# Patient Record
Sex: Female | Born: 1965 | Hispanic: Yes | Marital: Married | Smoking: Never smoker
Health system: Southern US, Community
[De-identification: ages and names within clinical notes are randomized; demographics above are authoritative.]

## PROBLEM LIST (undated history)

## (undated) DIAGNOSIS — K580 Irritable bowel syndrome with diarrhea: Secondary | ICD-10-CM

## (undated) DIAGNOSIS — M542 Cervicalgia: Secondary | ICD-10-CM

## (undated) DIAGNOSIS — G4700 Insomnia, unspecified: Secondary | ICD-10-CM

## (undated) DIAGNOSIS — F5104 Psychophysiologic insomnia: Secondary | ICD-10-CM

## (undated) DIAGNOSIS — K219 Gastro-esophageal reflux disease without esophagitis: Secondary | ICD-10-CM

## (undated) DIAGNOSIS — J0100 Acute maxillary sinusitis, unspecified: Secondary | ICD-10-CM

## (undated) DIAGNOSIS — G8929 Other chronic pain: Secondary | ICD-10-CM

## (undated) DIAGNOSIS — R29898 Other symptoms and signs involving the musculoskeletal system: Secondary | ICD-10-CM

## (undated) DIAGNOSIS — Z91018 Allergy to other foods: Secondary | ICD-10-CM

## (undated) DIAGNOSIS — C801 Malignant (primary) neoplasm, unspecified: Secondary | ICD-10-CM

## (undated) DIAGNOSIS — T7840XA Allergy, unspecified, initial encounter: Secondary | ICD-10-CM

## (undated) DIAGNOSIS — R109 Unspecified abdominal pain: Secondary | ICD-10-CM

## (undated) DIAGNOSIS — E509 Vitamin A deficiency, unspecified: Secondary | ICD-10-CM

## (undated) DIAGNOSIS — K589 Irritable bowel syndrome without diarrhea: Secondary | ICD-10-CM

## (undated) DIAGNOSIS — K30 Functional dyspepsia: Secondary | ICD-10-CM

## (undated) HISTORY — DX: Functional dyspepsia: K30

## (undated) HISTORY — DX: Acute maxillary sinusitis, unspecified: J01.00

## (undated) HISTORY — DX: Cervicalgia: M54.2

## (undated) HISTORY — DX: Allergy, unspecified, initial encounter: T78.40XA

## (undated) HISTORY — DX: Vitamin a deficiency, unspecified: E50.9

## (undated) HISTORY — DX: Insomnia, unspecified: G47.00

## (undated) HISTORY — DX: Other symptoms and signs involving the musculoskeletal system: R29.898

## (undated) HISTORY — DX: Irritable bowel syndrome, unspecified: K58.9

## (undated) HISTORY — DX: Other chronic pain: G89.29

## (undated) HISTORY — DX: Psychophysiologic insomnia: F51.04

## (undated) HISTORY — PX: TUBAL LIGATION: SHX77

## (undated) HISTORY — DX: Gastro-esophageal reflux disease without esophagitis: K21.9

## (undated) HISTORY — DX: Allergy to other foods: Z91.018

## (undated) HISTORY — PX: ABDOMINAL HYSTERECTOMY: SHX81

## (undated) HISTORY — DX: Irritable bowel syndrome with diarrhea: K58.0

## (undated) HISTORY — DX: Unspecified abdominal pain: R10.9

---

## 2004-08-14 ENCOUNTER — Ambulatory Visit: Payer: Self-pay | Admitting: Internal Medicine

## 2004-08-22 ENCOUNTER — Ambulatory Visit: Payer: Self-pay | Admitting: Internal Medicine

## 2005-03-13 ENCOUNTER — Ambulatory Visit: Payer: Self-pay

## 2005-07-20 ENCOUNTER — Ambulatory Visit: Payer: Self-pay

## 2006-01-16 ENCOUNTER — Ambulatory Visit: Payer: Self-pay | Admitting: Gastroenterology

## 2008-02-20 ENCOUNTER — Ambulatory Visit: Payer: Self-pay | Admitting: Unknown Physician Specialty

## 2008-12-17 ENCOUNTER — Ambulatory Visit: Payer: Self-pay | Admitting: Family Medicine

## 2009-01-05 ENCOUNTER — Ambulatory Visit: Payer: Self-pay | Admitting: Unknown Physician Specialty

## 2009-01-11 ENCOUNTER — Ambulatory Visit: Payer: Self-pay | Admitting: Unknown Physician Specialty

## 2010-06-27 ENCOUNTER — Emergency Department: Payer: Self-pay | Admitting: Emergency Medicine

## 2010-10-13 ENCOUNTER — Ambulatory Visit: Payer: Self-pay | Admitting: Unknown Physician Specialty

## 2011-06-01 ENCOUNTER — Ambulatory Visit: Payer: Self-pay | Admitting: Family Medicine

## 2012-09-26 LAB — LIPID PANEL
CHOLESTEROL: 193 mg/dL (ref 0–200)
HDL: 81 mg/dL — AB (ref 35–70)
LDL Cholesterol: 96 mg/dL
Triglycerides: 79 mg/dL (ref 40–160)

## 2014-01-22 ENCOUNTER — Ambulatory Visit: Payer: Self-pay | Admitting: Family Medicine

## 2014-01-22 LAB — HM MAMMOGRAPHY: HM Mammogram: NORMAL

## 2014-01-25 ENCOUNTER — Ambulatory Visit: Payer: Self-pay | Admitting: Family Medicine

## 2014-12-17 ENCOUNTER — Emergency Department: Payer: Self-pay | Admitting: Student

## 2014-12-24 ENCOUNTER — Ambulatory Visit: Payer: Self-pay | Admitting: Family Medicine

## 2015-05-03 ENCOUNTER — Ambulatory Visit (INDEPENDENT_AMBULATORY_CARE_PROVIDER_SITE_OTHER): Payer: 59 | Admitting: Family Medicine

## 2015-05-03 ENCOUNTER — Encounter: Payer: Self-pay | Admitting: Family Medicine

## 2015-05-03 VITALS — BP 116/70 | HR 83 | Temp 98.2°F | Resp 16 | Ht 63.0 in | Wt 123.7 lb

## 2015-05-03 DIAGNOSIS — K219 Gastro-esophageal reflux disease without esophagitis: Secondary | ICD-10-CM | POA: Diagnosis not present

## 2015-05-03 DIAGNOSIS — G8929 Other chronic pain: Secondary | ICD-10-CM | POA: Diagnosis not present

## 2015-05-03 DIAGNOSIS — G4700 Insomnia, unspecified: Secondary | ICD-10-CM | POA: Insufficient documentation

## 2015-05-03 DIAGNOSIS — M2669 Other specified disorders of temporomandibular joint: Secondary | ICD-10-CM

## 2015-05-03 DIAGNOSIS — K580 Irritable bowel syndrome with diarrhea: Secondary | ICD-10-CM

## 2015-05-03 DIAGNOSIS — K589 Irritable bowel syndrome without diarrhea: Secondary | ICD-10-CM | POA: Insufficient documentation

## 2015-05-03 DIAGNOSIS — R6884 Jaw pain: Secondary | ICD-10-CM

## 2015-05-03 DIAGNOSIS — M26649 Arthritis of unspecified temporomandibular joint: Secondary | ICD-10-CM | POA: Insufficient documentation

## 2015-05-03 DIAGNOSIS — K30 Functional dyspepsia: Secondary | ICD-10-CM | POA: Insufficient documentation

## 2015-05-03 DIAGNOSIS — E559 Vitamin D deficiency, unspecified: Secondary | ICD-10-CM | POA: Insufficient documentation

## 2015-05-03 DIAGNOSIS — Z862 Personal history of diseases of the blood and blood-forming organs and certain disorders involving the immune mechanism: Secondary | ICD-10-CM | POA: Insufficient documentation

## 2015-05-03 DIAGNOSIS — Z5189 Encounter for other specified aftercare: Secondary | ICD-10-CM | POA: Insufficient documentation

## 2015-05-03 DIAGNOSIS — J3089 Other allergic rhinitis: Secondary | ICD-10-CM

## 2015-05-03 DIAGNOSIS — J302 Other seasonal allergic rhinitis: Secondary | ICD-10-CM | POA: Insufficient documentation

## 2015-05-03 DIAGNOSIS — K582 Mixed irritable bowel syndrome: Secondary | ICD-10-CM | POA: Insufficient documentation

## 2015-05-03 MED ORDER — OMEPRAZOLE 20 MG PO CPDR: 20.0000 mg | DELAYED_RELEASE_CAPSULE | Freq: Every day | ORAL | Status: DC

## 2015-05-03 NOTE — Addendum Note (Signed)
Addended by: Lolita Rieger D on: 05/03/2015 04:21 PM   Modules accepted: Orders

## 2015-05-03 NOTE — Progress Notes (Signed)
Name: Helen Green   MRN: [id number]    DOB: 1966/07/08   Date:05/03/2015       Progress Note  Subjective  Chief Complaint  Chief Complaint  Patient presents with  . Referral    Patient needs X-Rays for TMJ-grinding, pain on left side and jaw.  . Temporomandibular Joint Pain    Onset 2 months, Worsen on left side-started and can not open mouth wide and having pain, griding sensation    HPI  Jaw Pain: she has a history of TMJ but states symptoms are severe now and she will have oral surgery and would like to make sure everything is okay before she goes for wisdom tooth extraction  GERD: she is currently Ranitidine, but is not working for her any longer, she still has heartburn, she used to take Omeprazole but has been out and would like a refill.   IBS: she continues to have a lot of pain intermittently, she states last episode was a couple of weeks ago , she went to [facility] but no labs or studies were done, she was concerned about appendicitis but she did not have a fever or nausea/vomitng . Pain resolved within a couple of days.   Patient Active Problem List   Diagnosis Date Noted  . Insomnia, persistent 05/03/2015  . Encounter for other specified aftercare 05/03/2015  . Functional dyspepsia 05/03/2015  . Gastro-esophageal reflux disease without esophagitis 05/03/2015  . History of anemia 05/03/2015  . Perennial allergic rhinitis with seasonal variation 05/03/2015  . 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 Vitamin D deficiency 05/03/2015  . Irritable bowel syndrome with diarrhea 05/03/2015    History  Substance Use Topics  . Smoking status: Never Smoker   . Smokeless tobacco: Not on file  . Alcohol Use: No     Current outpatient prescriptions:  .  ALPRAZolam (XANAX) 0.5 MG tablet, Take 1 tablet by mouth daily., Disp: , Rfl:  .  cyclobenzaprine (FLEXERIL) 5 MG tablet, Take 1 tablet by mouth at bedtime., Disp: , Rfl:  .  fexofenadine (ALLEGRA ALLERGY) 180 MG  tablet, Take 1 tablet by mouth as needed., Disp: , Rfl:  .  hyoscyamine (LEVBID) 0.375 MG 12 hr tablet, Take 1 tablet by mouth 2 (two) times daily as needed., Disp: , Rfl:  .  montelukast (SINGULAIR) 10 MG tablet, Take 1 tablet by mouth daily., Disp: , Rfl:  .  omeprazole (PRILOSEC) 20 MG capsule, Take 20 mg by mouth., Disp: , Rfl:  .  ranitidine (ZANTAC) 150 MG tablet, Take 1 tablet by mouth daily., Disp: , Rfl:   Allergies  Allergen Reactions  . Apple Hives, Shortness Of Breath and Swelling  . Daucus Carota Hives, Shortness Of Breath and Swelling  . Other Hives, Shortness Of Breath and Swelling    Pears, avacados, nuts  . Avocado     and raw fruit and vegetables    ROS  Constitutional: Negative for fever or weight change.  Respiratory: Negative for cough and shortness of breath.   Cardiovascular: Negative for chest pain or palpitations.  Gastrointestinal: Positive  for abdominal pain but  no bowel changes.  Musculoskeletal: Negative for gait problem or joint swelling.  Skin: Negative for rash.  Neurological: Negative for dizziness or headache.  No other specific complaints in a complete review of systems (except as listed in HPI above).  Objective  Filed Vitals:   05/03/15 1431  BP: 116/70  Pulse: 83  Temp: 98.2 F (36.8 C)  TempSrc: Oral  Resp: 16  Height: 5\' 3"  (1.6 m)  Weight: 123 lb 11.2 oz (56.11 kg)  SpO2: 97%    Body mass index is 21.92 kg/(m^2).    Physical Exam  Constitutional: Patient appears well-developed and well-nourished. No distress.  Eyes:  No scleral icterus.  Tender during palpation of left TMJ, also pain with abduction of jaw, no swelling or redness Neck: Normal range of motion. Neck supple. Cardiovascular: Normal rate, regular rhythm and normal heart sounds.  No murmur heard. No BLE edema. Pulmonary/Chest: Effort normal and breath sounds normal. No respiratory distress. Abdominal: Soft.  There is tenderness diffusely  Psychiatric: Patient  has a normal mood and affect. behavior is normal. Judgment and thought content normal.   Assessment & Plan  1. Chronic jaw pain Take Tylenol prn , check x-rays - PR DENTAL OTHER TMJ FILMS  2. Gastro-esophageal reflux disease without esophagitis Stop Ranitidine, resume Omeprazole - omeprazole (PRILOSEC) 20 MG capsule; Take 1 capsule (20 mg total) by mouth daily.  Dispense: 30 capsule; Refill: 5  3. Irritable bowel syndrome with diarrhea Continue hyoscyamine since it seems to help her

## 2015-05-04 ENCOUNTER — Telehealth: Payer: Self-pay | Admitting: Family Medicine

## 2015-05-04 DIAGNOSIS — R6884 Jaw pain: Secondary | ICD-10-CM

## 2015-05-04 NOTE — Telephone Encounter (Signed)
Anderson Malta from [facility] [place] is requesting a corrected order for xray TMJ for chronic jaw pain. It should not be MRI. Not sure what side should be x-rayed so please verify that as well. If it is easier you can write it out and fax to [phone]

## 2015-05-04 NOTE — Telephone Encounter (Signed)
resent order per request

## 2015-05-06 ENCOUNTER — Ambulatory Visit: Payer: Self-pay | Admitting: Family Medicine

## 2015-05-12 ENCOUNTER — Ambulatory Visit: Admission: RE | Admit: 2015-05-12 | Payer: Self-pay | Source: Ambulatory Visit

## 2015-05-16 ENCOUNTER — Other Ambulatory Visit: Payer: Self-pay | Admitting: Family Medicine

## 2015-05-16 ENCOUNTER — Ambulatory Visit
Admission: RE | Admit: 2015-05-16 | Discharge: 2015-05-16 | Disposition: A | Discharge status: Home / Self Care | Payer: 59 | Source: Ambulatory Visit | Attending: Family Medicine | Admitting: Family Medicine

## 2015-05-16 DIAGNOSIS — R6884 Jaw pain: Secondary | ICD-10-CM | POA: Insufficient documentation

## 2015-06-30 ENCOUNTER — Encounter: Payer: 59 | Admitting: Family Medicine

## 2015-07-12 ENCOUNTER — Ambulatory Visit (INDEPENDENT_AMBULATORY_CARE_PROVIDER_SITE_OTHER): Payer: 59 | Admitting: Family Medicine

## 2015-07-12 ENCOUNTER — Encounter: Payer: Self-pay | Admitting: Family Medicine

## 2015-07-12 VITALS — BP 118/68 | HR 81 | Temp 98.6°F | Resp 18 | Ht 63.0 in | Wt 122.9 lb

## 2015-07-12 DIAGNOSIS — E559 Vitamin D deficiency, unspecified: Secondary | ICD-10-CM

## 2015-07-12 DIAGNOSIS — Z79899 Other long term (current) drug therapy: Secondary | ICD-10-CM

## 2015-07-12 DIAGNOSIS — N951 Menopausal and female climacteric states: Secondary | ICD-10-CM | POA: Diagnosis not present

## 2015-07-12 DIAGNOSIS — Z862 Personal history of diseases of the blood and blood-forming organs and certain disorders involving the immune mechanism: Secondary | ICD-10-CM

## 2015-07-12 DIAGNOSIS — J3089 Other allergic rhinitis: Secondary | ICD-10-CM

## 2015-07-12 DIAGNOSIS — K580 Irritable bowel syndrome with diarrhea: Secondary | ICD-10-CM | POA: Diagnosis not present

## 2015-07-12 DIAGNOSIS — Z23 Encounter for immunization: Secondary | ICD-10-CM

## 2015-07-12 DIAGNOSIS — R109 Unspecified abdominal pain: Secondary | ICD-10-CM | POA: Diagnosis not present

## 2015-07-12 DIAGNOSIS — J302 Other seasonal allergic rhinitis: Secondary | ICD-10-CM

## 2015-07-12 DIAGNOSIS — J309 Allergic rhinitis, unspecified: Secondary | ICD-10-CM | POA: Diagnosis not present

## 2015-07-12 DIAGNOSIS — K219 Gastro-esophageal reflux disease without esophagitis: Secondary | ICD-10-CM

## 2015-07-12 MED ORDER — OMEPRAZOLE 40 MG PO CPDR: 40.0000 mg | DELAYED_RELEASE_CAPSULE | Freq: Every day | ORAL | Status: DC

## 2015-07-12 MED ORDER — HYOSCYAMINE SULFATE ER 0.375 MG PO TB12: 0.3750 mg | ORAL_TABLET | Freq: Two times a day (BID) | ORAL | Status: DC | PRN

## 2015-07-12 MED ORDER — FEXOFENADINE HCL 180 MG PO TABS: 180.0000 mg | ORAL_TABLET | ORAL | Status: DC | PRN

## 2015-07-12 MED ORDER — FEXOFENADINE HCL 180 MG PO TABS: 180.0000 mg | ORAL_TABLET | ORAL | Status: AC | PRN

## 2015-07-12 MED ORDER — DULOXETINE HCL 60 MG PO CPEP: 60.0000 mg | ORAL_CAPSULE | Freq: Every day | ORAL | Status: DC

## 2015-07-12 MED ORDER — DULOXETINE HCL 60 MG PO CPEP
60.0000 mg | ORAL_CAPSULE | Freq: Every day | ORAL | Status: DC
Start: 2015-07-12 — End: 2015-07-12

## 2015-07-12 NOTE — Progress Notes (Signed)
Name: Helen Green   MRN: [id number]    DOB: 1966/09/05   Date:07/12/2015       Progress Note  Subjective  Chief Complaint  Chief Complaint  Patient presents with  . Abdominal Pain    LUQ worsening onset 2 months  . Hot Flashes    onset 1 month  . Gastrophageal Reflux    needs refill  . Allergic Rhinitis     needs refill    HPI  IBS: seen by GI multiple times, previous history of h. Pylori seen by GI about 8 months ago and had negative urea breath test. She continues to have abdominal pain, cramping, bloating, taking Levsin with some improvement, but still has to stay in the recliner at night to feel comfortable.   Perimenopause: she has noticed increase in hot flashes, status post hysterectomy, she would like to try a medication for it.   GERD: rx of Omeprazole was not at the pharmacy, she has burning sensation in her epigastric area that radiates to esophagus. Worse at night.  Pain is usually 2 hours after meals.   AR: she states her symptoms are under control, needs refills because Fall is about to start .   Left Flank Pain: she states it has been present for years, intermittent, but worse over the past 2 months. Pain is described as sharp and radiates to her left hip and also up to her upper back. No rashes  Patient Active Problem List   Diagnosis Date Noted  . Insomnia, persistent 05/03/2015  . Encounter for other specified aftercare 05/03/2015  . Functional dyspepsia 05/03/2015  . Gastro-esophageal reflux disease without esophagitis 05/03/2015  . History of anemia 05/03/2015  . Perennial allergic rhinitis with seasonal variation 05/03/2015  . 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 Vitamin D deficiency 05/03/2015  . Irritable bowel syndrome with diarrhea 05/03/2015    Past Surgical History  Procedure Laterality Date  . Tubal ligation    . Abdominal hysterectomy      Family History  Problem Relation Age of Onset  . Diabetes Mother   .  Hyperlipidemia Mother   . Hypertension Mother   . Liver disease Father     Social History   Social History  . Marital Status: Divorced    Spouse Name: N/A  . Number of Children: N/A  . Years of Education: N/A   Occupational History  . Not on file.   Social History Main Topics  . Smoking status: Never Smoker   . Smokeless tobacco: Never Used  . Alcohol Use: No  . Drug Use: No  . Sexual Activity: Not Currently   Other Topics Concern  . Not on file   Social History Narrative     Current outpatient prescriptions:  .  ALPRAZolam (XANAX) 0.5 MG tablet, Take 1 tablet by mouth daily., Disp: , Rfl:  .  cyclobenzaprine (FLEXERIL) 5 MG tablet, Take 1 tablet by mouth at bedtime., Disp: , Rfl:  .  fexofenadine (ALLEGRA ALLERGY) 180 MG tablet, Take 1 tablet by mouth as needed., Disp: , Rfl:  .  hyoscyamine (LEVBID) 0.375 MG 12 hr tablet, Take 1 tablet by mouth 2 (two) times daily as needed., Disp: , Rfl:  .  montelukast (SINGULAIR) 10 MG tablet, Take 1 tablet by mouth daily., Disp: , Rfl:  .  omeprazole (PRILOSEC) 20 MG capsule, Take 1 capsule (20 mg total) by mouth daily., Disp: 30 capsule, Rfl: 5  Allergies  Allergen Reactions  . Apple Hives, Shortness Of  Breath and Swelling  . Daucus Carota Hives, Shortness Of Breath and Swelling  . Other Hives, Shortness Of Breath and Swelling    Pears, avacados, nuts  . Avocado     and raw fruit and vegetables     ROS  Constitutional: Negative for fever or weight change.  Respiratory: Negative for cough and shortness of breath.   Cardiovascular: Negative for chest pain or palpitations.  Gastrointestinal: Positive  for abdominal pain, no bowel changes.  Musculoskeletal: Negative for gait problem or joint swelling.  Skin: Negative for rash.  Neurological: Negative for dizziness or headache.  No other specific complaints in a complete review of systems (except as listed in HPI above).  Objective  Filed Vitals:   07/12/15 1503  BP:  118/68  Pulse: 81  Temp: 98.6 F (37 C)  TempSrc: Oral  Resp: 18  Height: 5\' 3"  (1.6 m)  Weight: 122 lb 14.4 oz (55.747 kg)  SpO2: 96%    Body mass index is 21.78 kg/(m^2).  Physical Exam  Constitutional: Patient appears well-developed and well-nourished.No distress.  HEENT: head atraumatic, normocephalic, pupils equal and reactive to light,  neck supple, throat within normal limits Cardiovascular: Normal rate, regular rhythm and normal heart sounds.  No murmur heard. No BLE edema. Pulmonary/Chest: Effort normal and breath sounds normal. No respiratory distress. Abdominal: Soft.  There is epigastric pain, also left flank pain, positive CVA tenderness.  Psychiatric: Patient has a normal mood and affect. behavior is normal. Judgment and thought content normal.  PHQ2/9: Depression screen [facility] 2/9 07/12/2015 05/03/2015  Decreased Interest - 0  Down, Depressed, Hopeless 0 0  PHQ - 2 Score 0 0    Fall Risk: Fall Risk  07/12/2015 05/03/2015  Falls in the past year? No No    Functional Status Survey: Is the patient deaf or have difficulty hearing?: No Does the patient have difficulty seeing, even when wearing glasses/contacts?: Yes (glasses) Does the patient have difficulty concentrating, remembering, or making decisions?: No Does the patient have difficulty walking or climbing stairs?: No Does the patient have difficulty dressing or bathing?: No Does the patient have difficulty doing errands alone such as visiting a doctor's office or shopping?: No   Assessment & Plan  1. Gastro-esophageal reflux disease without esophagitis  We will send prescription again  - omeprazole (PRILOSEC) 40 MG capsule; Take 1 capsule (40 mg total) by mouth daily.  Dispense: 30 capsule; Refill: 5  2. Needs flu shot  - Flu Vaccine QUAD 36+ mos PF IM (Fluarix & Fluzone Quad PF)  3. Perennial allergic rhinitis with seasonal variation  - fexofenadine (ALLEGRA ALLERGY) 180 MG tablet; Take 1 tablet (180  mg total) by mouth as needed.  Dispense: 30 tablet; Refill: 5  4. Irritable bowel syndrome with diarrhea  - hyoscyamine (LEVBID) 0.375 MG 12 hr tablet; Take 1 tablet (0.375 mg total) by mouth 2 (two) times daily as needed.  Dispense: 60 tablet; Refill: 5 - DULoxetine (CYMBALTA) 60 MG capsule; Take 1 capsule (60 mg total) by mouth daily.  Dispense: 30 capsule; Refill: 2  5. History of anemia  - CBC with Differential/Platelet  6. Vitamin D deficiency  - Vit D  25 hydroxy (rtn osteoporosis monitoring)  7. Perimenopause  She will start with half the beads for one week, if not covered by insurance we will try Effexor, discussed possible side effects of medications - DULoxetine (CYMBALTA) 60 MG capsule; Take 1 capsule (60 mg total) by mouth daily.  Dispense: 30 capsule; Refill:  2 LTA) 60 MG capsule; Take 1 capsule (60 mg total) by mouth daily.  Dispense: 30 capsule; Refill: 2  8. Long-term use of high-risk medication  - Comprehensive metabolic panel - TSH  9. Left flank pain  She could not void before she left - CT Abdomen Pelvis Wo Contrast; Future

## 2015-07-29 ENCOUNTER — Ambulatory Visit
Admission: RE | Admit: 2015-07-29 | Discharge: 2015-07-29 | Disposition: A | Discharge status: Home / Self Care | Payer: 59 | Source: Ambulatory Visit | Attending: Family Medicine | Admitting: Family Medicine

## 2015-07-29 DIAGNOSIS — R109 Unspecified abdominal pain: Secondary | ICD-10-CM | POA: Insufficient documentation

## 2015-08-12 ENCOUNTER — Ambulatory Visit (INDEPENDENT_AMBULATORY_CARE_PROVIDER_SITE_OTHER): Payer: 59 | Admitting: Family Medicine

## 2015-08-12 ENCOUNTER — Encounter: Payer: Self-pay | Admitting: Family Medicine

## 2015-08-12 VITALS — BP 102/64 | HR 82 | Temp 98.0°F | Resp 16 | Ht 63.0 in | Wt 123.0 lb

## 2015-08-12 DIAGNOSIS — K580 Irritable bowel syndrome with diarrhea: Secondary | ICD-10-CM | POA: Diagnosis not present

## 2015-08-12 DIAGNOSIS — N951 Menopausal and female climacteric states: Secondary | ICD-10-CM | POA: Diagnosis not present

## 2015-08-12 DIAGNOSIS — R109 Unspecified abdominal pain: Secondary | ICD-10-CM

## 2015-08-12 DIAGNOSIS — K219 Gastro-esophageal reflux disease without esophagitis: Secondary | ICD-10-CM | POA: Diagnosis not present

## 2015-08-12 MED ORDER — VENLAFAXINE HCL ER 37.5 MG PO CP24: 37.5000 mg | ORAL_CAPSULE | Freq: Every day | ORAL | Status: DC

## 2015-08-12 MED ORDER — CLONIDINE HCL 0.1 MG PO TABS: 0.1000 mg | ORAL_TABLET | Freq: Every day | ORAL | Status: DC

## 2015-08-12 NOTE — Progress Notes (Signed)
Name: Helen Green   MRN: [id number]    DOB: [birth year]   Date:08/12/2015       Progress Note  Subjective  Chief Complaint  Chief Complaint  Patient presents with  . Follow-up    1 month  . Flank Pain    patient states comes and goes  . Gastroesophageal Reflux    unchanged  . Irritable Bowel Syndrome    unchanged patient states pain mostly at night  . Hot Flashes    patient never started cymbalta    HPI  IBS: seen by GI multiple times, previous history of h. Pylori seen by GI about 9 months ago and had negative urea breath test. She continues to have abdominal pain, cramping, bloating, taking Levsin with some improvement, but still has to stay in the recliner at night to feel comfortable. No changes since last visit   Perimenopause: she has noticed increase in night sweats, no symptoms during the day yet, status post hysterectomy, she was given prescription for Cymbalta last visit , one month ago, but never got it filled because of cost and was afraid of side effects. Symptoms are getting worse, one hour apart at night  GERD: back on Omeprazole and symptoms are stable.   Left Flank Pain: she states it has been present for years, intermittent, was worse two months ago, but CT was negative, she states symptoms not as severe now.   Patient Active Problem List   Diagnosis Date Noted  . Insomnia, persistent 05/03/2015  . Encounter for other specified aftercare 05/03/2015  . Functional dyspepsia 05/03/2015  . Gastro-esophageal reflux disease without esophagitis 05/03/2015  . History of anemia 05/03/2015  . Perennial allergic rhinitis with seasonal variation 05/03/2015  . 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 Vitamin D deficiency 05/03/2015  . Irritable bowel syndrome with diarrhea 05/03/2015    Past Surgical History  Procedure Laterality Date  . Tubal ligation    . Abdominal hysterectomy      Family History  Problem Relation Age of Onset  . Diabetes  Mother   . Hyperlipidemia Mother   . Hypertension Mother   . Liver disease Father     Social History   Social History  . Marital Status: Divorced    Spouse Name: N/A  . Number of Children: N/A  . Years of Education: N/A   Occupational History  . Not on file.   Social History Main Topics  . Smoking status: Never Smoker   . Smokeless tobacco: Never Used  . Alcohol Use: No  . Drug Use: No  . Sexual Activity: Not Currently   Other Topics Concern  . Not on file   Social History Narrative     Current outpatient prescriptions:  .  cloNIDine (CATAPRES) 0.1 MG tablet, Take 1 tablet (0.1 mg total) by mouth at bedtime., Disp: 30 tablet, Rfl: 2 .  cyclobenzaprine (FLEXERIL) 5 MG tablet, Take 1 tablet by mouth at bedtime., Disp: , Rfl:  .  fexofenadine (ALLEGRA ALLERGY) 180 MG tablet, Take 1 tablet (180 mg total) by mouth as needed., Disp: 30 tablet, Rfl: 5 .  hyoscyamine (LEVBID) 0.375 MG 12 hr tablet, Take 1 tablet (0.375 mg total) by mouth 2 (two) times daily as needed., Disp: 60 tablet, Rfl: 5 .  montelukast (SINGULAIR) 10 MG tablet, Take 1 tablet by mouth daily., Disp: , Rfl:  .  omeprazole (PRILOSEC) 40 MG capsule, Take 1 capsule (40 mg total) by mouth daily., Disp: 30 capsule, Rfl: 5 .  venlafaxine XR (EFFEXOR XR) 37.5 MG 24 hr capsule, Take 1-2 capsules (37.5-75 mg total) by mouth daily with breakfast. Start at one and increase to two after first week, Disp: 60 capsule, Rfl: 0  Allergies  Allergen Reactions  . Apple Hives, Shortness Of Breath and Swelling  . Daucus Carota Hives, Shortness Of Breath and Swelling  . Other Hives, Shortness Of Breath and Swelling    Pears, avacados, nuts  . Avocado     and raw fruit and vegetables     ROS  Constitutional: Negative for fever or weight change.  Respiratory: Negative for cough and shortness of breath.  Cardiovascular: Negative for chest pain or palpitations.  Gastrointestinal: Positive for abdominal pain, no bowel  changes.  Musculoskeletal: Negative for gait problem or joint swelling.  Skin: Negative for rash.  Neurological: Negative for dizziness or headache.  No other specific complaints in a complete review of systems (except as listed in HPI above  Objective  Filed Vitals:   08/12/15 1604  BP: 102/64  Pulse: 82  Temp: 98 F (36.7 C)  TempSrc: Oral  Resp: 16  Height: 5\' 3"  (1.6 m)  Weight: 123 lb (55.792 kg)  SpO2: 98%    Body mass index is 21.79 kg/(m^2).  Physical Exam  Constitutional: Patient appears well-developed and well-nourished.  No distress.  HEENT: head atraumatic, normocephalic, pupils equal and reactive to light, neck supple, throat within normal limits Cardiovascular: Normal rate, regular rhythm and normal heart sounds.  No murmur heard. No BLE edema. Pulmonary/Chest: Effort normal and breath sounds normal. No respiratory distress. Abdominal: Soft.  Diffuse, mild abdominal pain  Psychiatric: Patient has a normal mood and affect. behavior is normal. Judgment and thought content normal.  PHQ2/9: Depression screen [facility] 2/9 07/12/2015 05/03/2015  Decreased Interest - 0  Down, Depressed, Hopeless 0 0  PHQ - 2 Score 0 0     Fall Risk: Fall Risk  07/12/2015 05/03/2015  Falls in the past year? No No     Assessment & Plan  1. Perimenopause  She is willing to try another medication, discussed possible side effects - cloNIDine (CATAPRES) 0.1 MG tablet; Take 1 tablet (0.1 mg total) by mouth at bedtime.  Dispense: 30 tablet; Refill: 2 - venlafaxine XR (EFFEXOR XR) 37.5 MG 24 hr capsule; Take 1-2 capsules (37.5-75 mg total) by mouth daily with breakfast. Start at one and increase to two after first week  Dispense: 60 capsule; Refill: 0  2. Irritable bowel syndrome with diarrhea  We will try adding SSRI - venlafaxine XR (EFFEXOR XR) 37.5 MG 24 hr capsule; Take 1-2 capsules (37.5-75 mg total) by mouth daily with breakfast. Start at one and increase to two after first week   Dispense: 60 capsule; Refill: 0  3. Left flank pain  CT negative, had colonoscopy and EGD in the past  4. Gastro-esophageal reflux disease without esophagitis  Stable, still has symptoms frequently, seen by GI, on PPI, negative urea breath test

## 2015-10-28 ENCOUNTER — Telehealth: Payer: Self-pay | Admitting: Family Medicine

## 2015-10-28 MED ORDER — HYOSCYAMINE SULFATE 0.125 MG PO TABS: 0.1250 mg | ORAL_TABLET | ORAL | Status: DC | PRN

## 2015-10-28 NOTE — Telephone Encounter (Signed)
Patient would like to be put back on Anaspac 0.125 mg. States she was on something similar but it is not really working. Please send to [facility]

## 2015-10-28 NOTE — Telephone Encounter (Signed)
Sent generic form to take up to four times daily for spasms.

## 2015-11-01 ENCOUNTER — Encounter: Payer: 59 | Admitting: Family Medicine

## 2015-11-05 LAB — CBC WITH DIFFERENTIAL/PLATELET
BASOS ABS: 0 10*3/uL (ref 0.0–0.2)
Basos: 1 %
EOS (ABSOLUTE): 0.3 10*3/uL (ref 0.0–0.4)
Eos: 6 %
Hematocrit: 38.4 % (ref 34.0–46.6)
Hemoglobin: 13.2 g/dL (ref 11.1–15.9)
IMMATURE GRANS (ABS): 0 10*3/uL (ref 0.0–0.1)
IMMATURE GRANULOCYTES: 0 %
LYMPHS: 40 %
Lymphocytes Absolute: 2 10*3/uL (ref 0.7–3.1)
MCH: 28.6 pg (ref 26.6–33.0)
MCHC: 34.4 g/dL (ref 31.5–35.7)
MCV: 83 fL (ref 79–97)
MONOCYTES: 6 %
Monocytes Absolute: 0.3 10*3/uL (ref 0.1–0.9)
NEUTROS PCT: 47 %
Neutrophils Absolute: 2.4 10*3/uL (ref 1.4–7.0)
PLATELETS: 240 10*3/uL (ref 150–379)
RBC: 4.61 x10E6/uL (ref 3.77–5.28)
RDW: 13.9 % (ref 12.3–15.4)
WBC: 5 10*3/uL (ref 3.4–10.8)

## 2015-11-05 LAB — COMPREHENSIVE METABOLIC PANEL
ALT: 10 IU/L (ref 0–32)
AST: 12 IU/L (ref 0–40)
Albumin/Globulin Ratio: 1.9 (ref 1.1–2.5)
Albumin: 4.1 g/dL (ref 3.5–5.5)
Alkaline Phosphatase: 54 IU/L (ref 39–117)
BILIRUBIN TOTAL: 0.8 mg/dL (ref 0.0–1.2)
BUN/Creatinine Ratio: 15 (ref 9–23)
BUN: 13 mg/dL (ref 6–24)
CHLORIDE: 102 mmol/L (ref 96–106)
CO2: 25 mmol/L (ref 18–29)
CREATININE: 0.84 mg/dL (ref 0.57–1.00)
Calcium: 9.7 mg/dL (ref 8.7–10.2)
GFR calc non Af Amer: 83 mL/min/{1.73_m2} (ref >59)
GFR, EST AFRICAN AMERICAN: 96 mL/min/{1.73_m2} (ref >59)
GLUCOSE: 94 mg/dL (ref 65–99)
Globulin, Total: 2.2 g/dL (ref 1.5–4.5)
Potassium: 4.2 mmol/L (ref 3.5–5.2)
Sodium: 142 mmol/L (ref 134–144)
TOTAL PROTEIN: 6.3 g/dL (ref 6.0–8.5)

## 2015-11-05 LAB — VITAMIN D 25 HYDROXY (VIT D DEFICIENCY, FRACTURES): VIT D 25 HYDROXY: 20.7 ng/mL — AB (ref 30.0–100.0)

## 2015-11-05 LAB — TSH: TSH: 2.34 u[IU]/mL (ref 0.450–4.500)

## 2015-11-11 ENCOUNTER — Encounter: Payer: Self-pay | Admitting: Family Medicine

## 2015-11-11 ENCOUNTER — Ambulatory Visit (INDEPENDENT_AMBULATORY_CARE_PROVIDER_SITE_OTHER): Payer: 59 | Admitting: Family Medicine

## 2015-11-11 VITALS — BP 116/72 | HR 77 | Temp 97.7°F | Resp 16 | Ht 63.0 in | Wt 122.0 lb

## 2015-11-11 DIAGNOSIS — Z124 Encounter for screening for malignant neoplasm of cervix: Secondary | ICD-10-CM

## 2015-11-11 DIAGNOSIS — Z01419 Encounter for gynecological examination (general) (routine) without abnormal findings: Secondary | ICD-10-CM

## 2015-11-11 DIAGNOSIS — Z0000 Encounter for general adult medical examination without abnormal findings: Secondary | ICD-10-CM

## 2015-11-11 DIAGNOSIS — Z1239 Encounter for other screening for malignant neoplasm of breast: Secondary | ICD-10-CM | POA: Diagnosis not present

## 2015-11-11 NOTE — Progress Notes (Signed)
Name: Helen Green   MRN: [id number]    DOB: 1966-04-02   Date:11/11/2015       Progress Note  Subjective  Chief Complaint  Chief Complaint  Patient presents with  . Annual Exam    HPI  Well Woman: she had a hysterectomy but not sure if she has a cervix. It was for treatment of menorrhagia secondary to fibroid tumors. Mammogram is due. She denies being sexually since 2011, no vaginal discharge, no dysuria, no breast masses. Hot flashes has improved with Clonidine but she needs to get a refill.    Patient Active Problem List   Diagnosis Date Noted  . Insomnia, persistent 05/03/2015  . Encounter for other specified aftercare 05/03/2015  . Functional dyspepsia 05/03/2015  . Gastro-esophageal reflux disease without esophagitis 05/03/2015  . History of anemia 05/03/2015  . Perennial allergic rhinitis with seasonal variation 05/03/2015  . 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 Vitamin D deficiency 05/03/2015  . Irritable bowel syndrome with diarrhea 05/03/2015    Past Surgical History  Procedure Laterality Date  . Tubal ligation    . Abdominal hysterectomy      Family History  Problem Relation Age of Onset  . Diabetes Mother   . Hyperlipidemia Mother   . Hypertension Mother   . Liver disease Father     Social History   Social History  . Marital Status: Divorced    Spouse Name: N/A  . Number of Children: N/A  . Years of Education: N/A   Occupational History  . Not on file.   Social History Main Topics  . Smoking status: Never Smoker   . Smokeless tobacco: Never Used  . Alcohol Use: No  . Drug Use: No  . Sexual Activity: Not Currently   Other Topics Concern  . Not on file   Social History Narrative     Current outpatient prescriptions:  .  cloNIDine (CATAPRES) 0.1 MG tablet, Take 1 tablet (0.1 mg total) by mouth at bedtime., Disp: 30 tablet, Rfl: 2 .  cyclobenzaprine (FLEXERIL) 5 MG tablet, Take 1 tablet by mouth at bedtime., Disp: , Rfl:  .   fexofenadine (ALLEGRA ALLERGY) 180 MG tablet, Take 1 tablet (180 mg total) by mouth as needed., Disp: 30 tablet, Rfl: 5 .  hyoscyamine (LEVSIN) 0.125 MG tablet, Take 1 tablet (0.125 mg total) by mouth every 4 (four) hours as needed., Disp: 30 tablet, Rfl: 0 .  montelukast (SINGULAIR) 10 MG tablet, Take 1 tablet by mouth daily., Disp: , Rfl:  .  omeprazole (PRILOSEC) 40 MG capsule, Take 1 capsule (40 mg total) by mouth daily., Disp: 30 capsule, Rfl: 5 .  venlafaxine XR (EFFEXOR XR) 37.5 MG 24 hr capsule, Take 1-2 capsules (37.5-75 mg total) by mouth daily with breakfast. Start at one and increase to two after first week, Disp: 60 capsule, Rfl: 0  Allergies  Allergen Reactions  . Apple Hives, Shortness Of Breath and Swelling  . Daucus Carota Hives, Shortness Of Breath and Swelling  . Other Hives, Shortness Of Breath and Swelling    Pears, avacados, nuts  . Avocado     and raw fruit and vegetables     ROS  Constitutional: Negative for fever or weight change.  Respiratory: Negative for cough and shortness of breath.   Cardiovascular: Negative for chest pain or palpitations.  Gastrointestinal: Negative for abdominal pain, no bowel changes.  Musculoskeletal: Negative for gait problem or joint swelling.  Skin: Negative for rash.  Neurological: Negative for dizziness ,  positive for mild  Headache with Effexor  No other specific complaints in a complete review of systems (except as listed in HPI above).  Objective  Filed Vitals:   11/11/15 1540  BP: 116/72  Pulse: 77  Temp: 97.7 F (36.5 C)  TempSrc: Oral  Resp: 16  Height: 5\' 3"  (1.6 m)  Weight: 122 lb (55.339 kg)  SpO2: 98%    Body mass index is 21.62 kg/(m^2).  Physical Exam   Constitutional: Patient appears well-developed and well-nourished. No distress.  HENT: Head: Normocephalic and atraumatic. Ears: B TMs ok, no erythema or effusion; Nose: Nose normal. Mouth/Throat: Oropharynx is clear and moist. No oropharyngeal  exudate.  Eyes: Conjunctivae and EOM are normal. Pupils are equal, round, and reactive to light. No scleral icterus.  Neck: Normal range of motion. Neck supple. No JVD present. No thyromegaly present.  Cardiovascular: Normal rate, regular rhythm and normal heart sounds.  No murmur heard. No BLE edema. Pulmonary/Chest: Effort normal and breath sounds normal. No respiratory distress. Abdominal: Soft. Bowel sounds are normal, no distension. There is no tenderness. no masses Breast: no lumps or masses, no nipple discharge or rashes FEMALE GENITALIA:  External genitalia normal External urethra normal Vaginal vault normal without discharge or lesions Cervix normal without discharge or lesions Bimanual exam normal without masses RECTAL: not done Musculoskeletal: Normal range of motion, no joint effusions. No gross deformities Neurological: he is alert and oriented to person, place, and time. No cranial nerve deficit. Coordination, balance, strength, speech and gait are normal.  Skin: Skin is warm and dry. No rash noted. No erythema.  Psychiatric: Patient has a normal mood and affect. behavior is normal. Judgment and thought content normal.  Recent Results (from the past 2160 hour(s))  CBC with Differential/Platelet     Status: None   Collection Time: 11/04/15 10:43 AM  Result Value Ref Range   WBC 5.0 3.4 - 10.8 x10E3/uL   RBC 4.61 3.77 - 5.28 x10E6/uL   Hemoglobin 13.2 11.1 - 15.9 g/dL   Hematocrit 38.4 34.0 - 46.6 %   MCV 83 79 - 97 fL   MCH 28.6 26.6 - 33.0 pg   MCHC 34.4 31.5 - 35.7 g/dL   RDW 13.9 12.3 - 15.4 %   Platelets 240 150 - 379 x10E3/uL   Neutrophils 47 %   Lymphs 40 %   Monocytes 6 %   Eos 6 %   Basos 1 %   Neutrophils Absolute 2.4 1.4 - 7.0 x10E3/uL   Lymphocytes Absolute 2.0 0.7 - 3.1 x10E3/uL   Monocytes Absolute 0.3 0.1 - 0.9 x10E3/uL   EOS (ABSOLUTE) 0.3 0.0 - 0.4 x10E3/uL   Basophils Absolute 0.0 0.0 - 0.2 x10E3/uL   Immature Granulocytes 0 %   Immature Grans  (Abs) 0.0 0.0 - 0.1 x10E3/uL  Comprehensive metabolic panel     Status: None   Collection Time: 11/04/15 10:43 AM  Result Value Ref Range   Glucose 94 65 - 99 mg/dL   BUN 13 6 - 24 mg/dL   Creatinine, Ser 0.84 0.57 - 1.00 mg/dL   GFR calc non Af Amer 83 >59 mL/min/1.73   GFR calc Af Amer 96 >59 mL/min/1.73   BUN/Creatinine Ratio 15 9 - 23   Sodium 142 134 - 144 mmol/L   Potassium 4.2 3.5 - 5.2 mmol/L   Chloride 102 96 - 106 mmol/L   CO2 25 18 - 29 mmol/L   Calcium 9.7 8.7 - 10.2 mg/dL   Total Protein 6.3 6.0 -  8.5 g/dL   Albumin 4.1 3.5 - 5.5 g/dL   Globulin, Total 2.2 1.5 - 4.5 g/dL   Albumin/Globulin Ratio 1.9 1.1 - 2.5   Bilirubin Total 0.8 0.0 - 1.2 mg/dL   Alkaline Phosphatase 54 39 - 117 IU/L   AST 12 0 - 40 IU/L   ALT 10 0 - 32 IU/L  Vit D  25 hydroxy (rtn osteoporosis monitoring)     Status: Abnormal   Collection Time: 11/04/15 10:43 AM  Result Value Ref Range   Vit D, 25-Hydroxy 20.7 (L) 30.0 - 100.0 ng/mL    Comment: Vitamin D deficiency has been defined by the [place] and an Endocrine Society practice guideline as a level of serum 25-OH vitamin D less than 20 ng/mL (1,2). The Endocrine Society went on to further define vitamin D insufficiency as a level between 21 and 29 ng/mL (2). 1. IOM (Institute of Medicine). 2010. Dietary reference    intakes for calcium and D. [place]: The    [employer]. 2. Holick MF, Binkley Ackerman, Bischoff-Ferrari HA, et al.    Evaluation, treatment, and prevention of vitamin D    deficiency: an Endocrine Society clinical practice    guideline. JCEM. 2011 Jul; 96(7):1911-30.   TSH     Status: None   Collection Time: 11/04/15 10:43 AM  Result Value Ref Range   TSH 2.340 0.450 - 4.500 uIU/mL     PHQ2/9: Depression screen [facility] 2/9 11/11/2015 07/12/2015 05/03/2015  Decreased Interest 0 - 0  Down, Depressed, Hopeless 0 0 0  PHQ - 2 Score 0 0 0     Fall Risk: Fall Risk  11/11/2015 07/12/2015 05/03/2015   Falls in the past year? No No No     Functional Status Survey: Is the patient deaf or have difficulty hearing?: No Does the patient have difficulty seeing, even when wearing glasses/contacts?: Yes (glasses) Does the patient have difficulty concentrating, remembering, or making decisions?: No Does the patient have difficulty walking or climbing stairs?: No Does the patient have difficulty dressing or bathing?: No Does the patient have difficulty doing errands alone such as visiting a doctor's office or shopping?: No    Assessment & Plan  1. Well woman exam  Discussed importance of 150 minutes of physical activity weekly, eat two servings of fish weekly, eat one serving of tree nuts ( cashews, pistachios, pecans, almonds.Marland Kitchen) every other day, eat 6 servings of fruit/vegetables daily and drink plenty of water and avoid sweet beverages.   2. Cervical cancer screening  - PapLb, HPV, rfx16/18  3. Breast cancer screening  - MM Digital Screening; Future

## 2015-11-23 LAB — PAPLB, HPV, RFX16/18
HPV, high-risk: NEGATIVE
PAP SMEAR COMMENT: 0

## 2015-12-16 ENCOUNTER — Ambulatory Visit: Payer: 59 | Admitting: Family Medicine

## 2015-12-23 ENCOUNTER — Ambulatory Visit: Payer: 59 | Admitting: Family Medicine

## 2016-01-12 ENCOUNTER — Other Ambulatory Visit: Payer: Self-pay | Admitting: Family Medicine

## 2016-01-30 ENCOUNTER — Other Ambulatory Visit: Payer: Self-pay | Admitting: Family Medicine

## 2016-01-30 NOTE — Telephone Encounter (Signed)
Patient requesting refill. 

## 2016-03-26 ENCOUNTER — Ambulatory Visit (INDEPENDENT_AMBULATORY_CARE_PROVIDER_SITE_OTHER): Payer: 59 | Admitting: Family Medicine

## 2016-03-26 ENCOUNTER — Encounter: Payer: Self-pay | Admitting: Family Medicine

## 2016-03-26 VITALS — BP 108/70 | HR 92 | Temp 98.5°F | Resp 18 | Ht 63.0 in | Wt 124.4 lb

## 2016-03-26 DIAGNOSIS — J3081 Allergic rhinitis due to animal (cat) (dog) hair and dander: Secondary | ICD-10-CM | POA: Diagnosis not present

## 2016-03-26 DIAGNOSIS — J9801 Acute bronchospasm: Secondary | ICD-10-CM

## 2016-03-26 DIAGNOSIS — J40 Bronchitis, not specified as acute or chronic: Secondary | ICD-10-CM | POA: Diagnosis not present

## 2016-03-26 MED ORDER — ALBUTEROL SULFATE HFA 108 (90 BASE) MCG/ACT IN AERS: 2.0000 | INHALATION_SPRAY | Freq: Four times a day (QID) | RESPIRATORY_TRACT | Status: DC | PRN

## 2016-03-26 MED ORDER — AZITHROMYCIN 250 MG PO TABS: ORAL_TABLET | ORAL | Status: DC

## 2016-03-27 NOTE — Progress Notes (Signed)
Date:  03/26/2016   Name:  Helen Green   DOB:  Jun 26, 1966   MRN:  [id number]  PCP:  Loistine Chance, MD    Chief Complaint: Cough and Sore Throat   History of Present Illness:  This is a 50 y.o. female who recently cleaned room with prior dog, allergic. C/o 3d hx sore throat, cough productive of green phlegm, sopme wheezing, occ otalgia, some lightheadedness, getting worse.  Review of Systems:  Review of Systems  Constitutional: Negative for fever.  Cardiovascular: Negative for chest pain and leg swelling.  Neurological: Negative for syncope.    Patient Active Problem List   Diagnosis Date Noted  . Insomnia, persistent 05/03/2015  . Encounter for other specified aftercare 05/03/2015  . Functional dyspepsia 05/03/2015  . Gastro-esophageal reflux disease without esophagitis 05/03/2015  . History of anemia 05/03/2015  . Perennial allergic rhinitis with seasonal variation 05/03/2015  . 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 Vitamin D deficiency 05/03/2015  . Irritable bowel syndrome with diarrhea 05/03/2015    Prior to Admission medications   Medication Sig Start Date End Date Taking? Authorizing Provider  albuterol (PROVENTIL HFA;VENTOLIN HFA) 108 (90 Base) MCG/ACT inhaler Inhale 2 puffs into the lungs every 6 (six) hours as needed for wheezing or shortness of breath. 03/26/16   Adline Potter, MD  azithromycin (ZITHROMAX) 250 MG tablet Take two tablets today then one tablet daily for four days 03/26/16   Adline Potter, MD  cloNIDine (CATAPRES) 0.1 MG tablet Take 1 tablet (0.1 mg total) by mouth at bedtime. 08/12/15   Steele Sizer, MD  cyclobenzaprine (FLEXERIL) 5 MG tablet Take 1 tablet by mouth at bedtime. 01/28/15   Historical Provider, MD  fexofenadine (ALLEGRA ALLERGY) 180 MG tablet Take 1 tablet (180 mg total) by mouth as needed. 07/12/15   Steele Sizer, MD  fluticasone Asencion Islam) 50 MCG/ACT nasal spray instill 2 spray into each nostril at bedtime 01/31/16   Steele Sizer, MD  hyoscyamine (LEVSIN, ANASPAZ) 0.125 MG tablet take 1 tablet by mouth every 4 hours if needed 01/13/16   Steele Sizer, MD  omeprazole (PRILOSEC) 40 MG capsule Take 1 capsule (40 mg total) by mouth daily. 07/12/15   Steele Sizer, MD  venlafaxine XR (EFFEXOR XR) 37.5 MG 24 hr capsule Take 1-2 capsules (37.5-75 mg total) by mouth daily with breakfast. Start at one and increase to two after first week 08/12/15   Steele Sizer, MD    Allergies  Allergen Reactions  . Apple Hives, Shortness Of Breath and Swelling  . Daucus Carota Hives, Shortness Of Breath and Swelling  . Other Hives, Shortness Of Breath and Swelling    Pears, avacados, nuts  . Avocado     and raw fruit and vegetables    Past Surgical History  Procedure Laterality Date  . Tubal ligation    . Abdominal hysterectomy      Social History  Substance Use Topics  . Smoking status: Never Smoker   . Smokeless tobacco: Never Used  . Alcohol Use: No    Family History  Problem Relation Age of Onset  . Diabetes Mother   . Hyperlipidemia Mother   . Hypertension Mother   . Liver disease Father     Medication list has been reviewed and updated.  Physical Examination: BP 108/70 mmHg  Pulse 92  Temp(Src) 98.5 F (36.9 C)  Resp 18  Ht 5\' 3"  (1.6 m)  Wt 124 lb 7 oz (56.444 kg)  BMI 22.05 kg/m2  SpO2 97%  Physical Exam  Constitutional: She appears well-developed and well-nourished.  HENT:  Right Ear: External ear normal.  Left Ear: External ear normal.  Mouth/Throat: Oropharynx is clear and moist.  TM's clear  Neck: Neck supple.  Pulmonary/Chest: Effort normal.  Diffuse exp wheezes  Musculoskeletal: She exhibits no edema.  Lymphadenopathy:    She has no cervical adenopathy.  Neurological: She is alert.  Skin: Skin is warm and dry.  Psychiatric: She has a normal mood and affect. Her behavior is normal.  Nursing note and vitals reviewed.   Assessment and Plan:  1. Bronchitis Zpak as  directed  2. Bronchospasm Albuterol MDI prn (has from previous visit)  3. Allergic rhinitis due to animal hair and dander Avoid dog exposures  Return if symptoms worsen or fail to improve.  Satira Anis. [facility]  03/27/2016

## 2016-04-19 ENCOUNTER — Ambulatory Visit (INDEPENDENT_AMBULATORY_CARE_PROVIDER_SITE_OTHER): Payer: 59 | Admitting: Family Medicine

## 2016-04-19 ENCOUNTER — Encounter: Payer: Self-pay | Admitting: Family Medicine

## 2016-04-19 VITALS — BP 106/64 | HR 82 | Temp 97.7°F | Resp 16 | Ht 63.0 in | Wt 126.8 lb

## 2016-04-19 DIAGNOSIS — E559 Vitamin D deficiency, unspecified: Secondary | ICD-10-CM

## 2016-04-19 DIAGNOSIS — R1084 Generalized abdominal pain: Secondary | ICD-10-CM

## 2016-04-19 DIAGNOSIS — K580 Irritable bowel syndrome with diarrhea: Secondary | ICD-10-CM

## 2016-04-19 DIAGNOSIS — K219 Gastro-esophageal reflux disease without esophagitis: Secondary | ICD-10-CM | POA: Diagnosis not present

## 2016-04-19 MED ORDER — ELUXADOLINE 75 MG PO TABS: 1.0000 | ORAL_TABLET | Freq: Two times a day (BID) | ORAL | Status: DC

## 2016-04-19 MED ORDER — RANITIDINE HCL 300 MG PO TABS: 300.0000 mg | ORAL_TABLET | Freq: Every day | ORAL | Status: DC

## 2016-04-19 NOTE — Progress Notes (Signed)
Name: Helen Green   MRN: [id number]    DOB: 1966-05-20   Date:04/19/2016       Progress Note  Subjective  Chief Complaint  Chief Complaint  Patient presents with  . Abdominal Pain    Patient states she has Chronic stomach pain and GERD, patient states the Omeprazole is not helping and wanted to talk about different therapy. Patient states it used to only be sharp pains at night, but now pain is having a sour stomach as soon as she wakes up, belching and feels like her symptoms are worsen.      HPI  Abdominal pain: she has a long history of abdominal pain, treated h. Pylori years ago. She also has IBS diarrhea type - has about 3 bowel movements per day. She has bloating, burping, pain is described as dull or burning at times sharp, it is worse at night, but present during the day. Wakes up at night with pain, but no bowel movements during the night. She has gained a little weight. No fever or chills. No change in appetite. She had a CT scan back in 07/2015, but is concerned about her pancreas because sometimes the pain goes to left side of back. There is change in her life. She has been dating for the past 2 months and that is when the symptoms started. He is a widow - his wife died of complications of breast cancer - she worries that she will be sick and he will suffer if she dies.   Patient Active Problem List   Diagnosis Date Noted  . Insomnia, persistent 05/03/2015  . Encounter for other specified aftercare 05/03/2015  . Functional dyspepsia 05/03/2015  . Gastro-esophageal reflux disease without esophagitis 05/03/2015  . History of anemia 05/03/2015  . Perennial allergic rhinitis with seasonal variation 05/03/2015  . 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 Vitamin D deficiency 05/03/2015  . Irritable bowel syndrome with diarrhea 05/03/2015    Past Surgical History  Procedure Laterality Date  . Tubal ligation    . Abdominal hysterectomy      Family History  Problem  Relation Age of Onset  . Diabetes Mother   . Hyperlipidemia Mother   . Hypertension Mother   . Liver disease Father     Social History   Social History  . Marital Status: Divorced    Spouse Name: N/A  . Number of Children: N/A  . Years of Education: N/A   Occupational History  . Not on file.   Social History Main Topics  . Smoking status: Never Smoker   . Smokeless tobacco: Never Used  . Alcohol Use: No  . Drug Use: No  . Sexual Activity: Not Currently   Other Topics Concern  . Not on file   Social History Narrative     Current outpatient prescriptions:  .  albuterol (PROVENTIL HFA;VENTOLIN HFA) 108 (90 Base) MCG/ACT inhaler, Inhale 2 puffs into the lungs every 6 (six) hours as needed for wheezing or shortness of breath., Disp: 1 Inhaler, Rfl: 2 .  fexofenadine (ALLEGRA ALLERGY) 180 MG tablet, Take 1 tablet (180 mg total) by mouth as needed., Disp: 30 tablet, Rfl: 5 .  fluticasone (FLONASE) 50 MCG/ACT nasal spray, instill 2 spray into each nostril at bedtime, Disp: 16 g, Rfl: 2 .  hyoscyamine (LEVSIN, ANASPAZ) 0.125 MG tablet, take 1 tablet by mouth every 4 hours if needed, Disp: 30 tablet, Rfl: 2 .  omeprazole (PRILOSEC) 40 MG capsule, Take 1 capsule (40 mg total)  by mouth daily., Disp: 30 capsule, Rfl: 5 .  cyclobenzaprine (FLEXERIL) 5 MG tablet, Take 1 tablet by mouth at bedtime. Reported on 04/19/2016, Disp: , Rfl:  .  Eluxadoline (VIBERZI) 75 MG TABS, Take 1 tablet by mouth 2 (two) times daily., Disp: 60 tablet, Rfl: 0 .  ranitidine (ZANTAC) 300 MG tablet, Take 1 tablet (300 mg total) by mouth at bedtime., Disp: 30 tablet, Rfl: 2  Allergies  Allergen Reactions  . Apple Hives, Shortness Of Breath and Swelling  . Daucus Carota Hives, Shortness Of Breath and Swelling  . Other Hives, Shortness Of Breath and Swelling    Pears, avacados, nuts  . Avocado     and raw fruit and vegetables     ROS  Constitutional: Negative for fever or weight change.  Respiratory:  Negative for cough and shortness of breath.   Cardiovascular: Negative for chest pain or palpitations.  Gastrointestinal: Positive  for abdominal pain, no bowel changes bowel movements about 3 times week.  Musculoskeletal: Negative for gait problem or joint swelling.  Skin: Negative for rash.  Neurological: Negative for dizziness or headache.  No other specific complaints in a complete review of systems (except as listed in HPI above).  Objective  Filed Vitals:   04/19/16 0929  BP: 106/64  Pulse: 82  Temp: 97.7 F (36.5 C)  TempSrc: Oral  Resp: 16  Height: 5\' 3"  (1.6 m)  Weight: 126 lb 12.8 oz (57.516 kg)  SpO2: 98%    Body mass index is 22.47 kg/(m^2).  Physical Exam  Constitutional: Patient appears well-developed and well-nourished. No distress.  HEENT: head atraumatic, normocephalic, pupils equal and reactive to light, , neck supple, throat within normal limits Cardiovascular: Normal rate, regular rhythm and normal heart sounds.  No murmur heard. No BLE edema. Pulmonary/Chest: Effort normal and breath sounds normal. No respiratory distress. Abdominal: Soft.  There is tenderness in the epigastric area Psychiatric: Patient has a normal mood and affect. behavior is normal. Judgment and thought content normal.  PHQ2/9: Depression screen [facility] 2/9 04/19/2016 11/11/2015 07/12/2015 05/03/2015  Decreased Interest 0 0 - 0  Down, Depressed, Hopeless 0 0 0 0  PHQ - 2 Score 0 0 0 0    Fall Risk: Fall Risk  04/19/2016 11/11/2015 07/12/2015 05/03/2015  Falls in the past year? No No No No    Functional Status Survey: Is the patient deaf or have difficulty hearing?: No Does the patient have difficulty seeing, even when wearing glasses/contacts?: No Does the patient have difficulty concentrating, remembering, or making decisions?: No Does the patient have difficulty walking or climbing stairs?: No Does the patient have difficulty dressing or bathing?: No Does the patient have difficulty  doing errands alone such as visiting a doctor's office or shopping?: No    Assessment & Plan  1. Irritable bowel syndrome with diarrhea  Discussed possible side effects of medication , she will wait to get rx filled when lab results are back - Eluxadoline (VIBERZI) 75 MG TABS; Take 1 tablet by mouth 2 (two) times daily.  Dispense: 60 tablet; Refill: 0  2. Gastro-esophageal reflux disease without esophagitis  - ranitidine (ZANTAC) 300 MG tablet; Take 1 tablet (300 mg total) by mouth at bedtime.  Dispense: 30 tablet; Refill: 2  3. Vitamin D deficiency  - VITAMIN D 25 Hydroxy (Vit-D Deficiency, Fractures)  4. Generalized abdominal pain  - Lipase - H. pylori breath test - Comprehensive Metabolic Panel (CMET) - CBC

## 2016-04-20 ENCOUNTER — Ambulatory Visit
Admission: RE | Admit: 2016-04-20 | Discharge: 2016-04-20 | Disposition: A | Discharge status: Home / Self Care | Payer: 59 | Source: Ambulatory Visit | Attending: Family Medicine | Admitting: Family Medicine

## 2016-04-20 ENCOUNTER — Other Ambulatory Visit: Payer: Self-pay | Admitting: Family Medicine

## 2016-04-20 DIAGNOSIS — Z1239 Encounter for other screening for malignant neoplasm of breast: Secondary | ICD-10-CM

## 2016-04-20 DIAGNOSIS — Z1231 Encounter for screening mammogram for malignant neoplasm of breast: Secondary | ICD-10-CM | POA: Insufficient documentation

## 2016-04-20 LAB — CBC
HEMATOCRIT: 38.7 % (ref 34.0–46.6)
HEMOGLOBIN: 13.2 g/dL (ref 11.1–15.9)
MCH: 29.1 pg (ref 26.6–33.0)
MCHC: 34.1 g/dL (ref 31.5–35.7)
MCV: 85 fL (ref 79–97)
Platelets: 265 10*3/uL (ref 150–379)
RBC: 4.53 x10E6/uL (ref 3.77–5.28)
RDW: 14 % (ref 12.3–15.4)
WBC: 6.6 10*3/uL (ref 3.4–10.8)

## 2016-04-20 LAB — COMPREHENSIVE METABOLIC PANEL
ALT: 13 IU/L (ref 0–32)
AST: 13 IU/L (ref 0–40)
Albumin/Globulin Ratio: 1.7 (ref 1.2–2.2)
Albumin: 4.1 g/dL (ref 3.5–5.5)
Alkaline Phosphatase: 59 IU/L (ref 39–117)
BUN/Creatinine Ratio: 15 (ref 9–23)
BUN: 10 mg/dL (ref 6–24)
Bilirubin Total: 0.8 mg/dL (ref 0.0–1.2)
CALCIUM: 9.3 mg/dL (ref 8.7–10.2)
CO2: 27 mmol/L (ref 18–29)
CREATININE: 0.65 mg/dL (ref 0.57–1.00)
Chloride: 100 mmol/L (ref 96–106)
GFR calc Af Amer: 122 mL/min/{1.73_m2} (ref >59)
GFR, EST NON AFRICAN AMERICAN: 106 mL/min/{1.73_m2} (ref >59)
GLOBULIN, TOTAL: 2.4 g/dL (ref 1.5–4.5)
Glucose: 89 mg/dL (ref 65–99)
Potassium: 3.9 mmol/L (ref 3.5–5.2)
Sodium: 142 mmol/L (ref 134–144)
Total Protein: 6.5 g/dL (ref 6.0–8.5)

## 2016-04-20 LAB — VITAMIN D 25 HYDROXY (VIT D DEFICIENCY, FRACTURES): VIT D 25 HYDROXY: 22.4 ng/mL — AB (ref 30.0–100.0)

## 2016-04-20 LAB — LIPASE: LIPASE: 41 U/L (ref 0–59)

## 2016-04-22 LAB — H. PYLORI BREATH TEST: H. pylori UBiT: NEGATIVE

## 2016-05-11 ENCOUNTER — Telehealth: Payer: Self-pay | Admitting: Family Medicine

## 2016-05-11 NOTE — Telephone Encounter (Signed)
PT SAID THAT THE PHARM HAS SENT A PRIOR APPROVAL FOR HER MEDICATION VIBERZI AND SHE HAS NOT HEARD ANYTHING BACK YET AND SHE SAYS THAT IT HAS BEEN A WEEK,

## 2016-05-14 NOTE — Telephone Encounter (Signed)
Patient medication Viberzi was approved on May 07, 2016 on [email or web address] when I called pharmacy to have them run patient medication at [facility], they stated patient was not a customer there. Called and left patient a message.

## 2016-05-14 NOTE — Telephone Encounter (Signed)
Left voicemail medication is ready for pick up at [facility] on [address]. For $30.

## 2016-05-18 ENCOUNTER — Ambulatory Visit (INDEPENDENT_AMBULATORY_CARE_PROVIDER_SITE_OTHER): Payer: 59 | Admitting: Family Medicine

## 2016-05-18 ENCOUNTER — Encounter: Payer: Self-pay | Admitting: Family Medicine

## 2016-05-18 VITALS — BP 104/68 | HR 79 | Temp 97.9°F | Resp 18 | Ht 63.0 in | Wt 125.5 lb

## 2016-05-18 DIAGNOSIS — K580 Irritable bowel syndrome with diarrhea: Secondary | ICD-10-CM | POA: Diagnosis not present

## 2016-05-18 DIAGNOSIS — N644 Mastodynia: Secondary | ICD-10-CM | POA: Diagnosis not present

## 2016-05-18 MED ORDER — NAPROXEN 500 MG PO TABS: 500.0000 mg | ORAL_TABLET | Freq: Two times a day (BID) | ORAL | 0 refills | Status: DC

## 2016-05-18 NOTE — Progress Notes (Signed)
Name: Helen Green   MRN: [id number]    DOB: 05-01-1966   Date:05/18/2016       Progress Note  Subjective  Chief Complaint  Chief Complaint  Patient presents with  . Irritable Bowel Syndrome    1 month follow up  . Results    mammogram results  . Breast Pain    HPI  IBS : she has a long history of abdominal pain, treated h. Pylori years ago. She was having about 3 bowel movements per day. She has bloating, burping, pain is described as dull or burning at times sharp, it is worse at night, but also present during the day. Wakes up at night with pain, but no bowel movements during the night. No fever or chills. No change in appetite. She had a CT scan back in 07/2015, but is concerned about her pancreas because sometimes the pain goes to left side of back. There is change in her life. She has been dating for the past 3 months and that is when the symptoms started. He is a widow - his wife died of complications of breast cancer - she worries that she will be sick and he will suffer if she dies. She started Viberzi only two days ago ( because of PA ), she was able to sleep at night for the first time without pain. The only side effect if constipation, able to have a bowel movement today but had to strain. Currently only mild abdominal pain.   Breast pain: since mammogram she noticed pain on left breast, no lumps. She got a letter stating normal test, but worried because it says that her breast is dense. No nipple discharge. Pain is stabbing and aggravated by pressure.     Patient Active Problem List   Diagnosis Date Noted  . Insomnia, persistent 05/03/2015  . Encounter for other specified aftercare 05/03/2015  . Functional dyspepsia 05/03/2015  . Gastro-esophageal reflux disease without esophagitis 05/03/2015  . History of anemia 05/03/2015  . Perennial allergic rhinitis with seasonal variation 05/03/2015  . 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 Vitamin D deficiency  05/03/2015  . Irritable bowel syndrome with diarrhea 05/03/2015    Past Surgical History:  Procedure Laterality Date  . ABDOMINAL HYSTERECTOMY    . TUBAL LIGATION      Family History  Problem Relation Age of Onset  . Diabetes Mother   . Hyperlipidemia Mother   . Hypertension Mother   . Liver disease Father     Social History   Social History  . Marital status: Divorced    Spouse name: N/A  . Number of children: N/A  . Years of education: N/A   Occupational History  . Not on file.   Social History Main Topics  . Smoking status: Never Smoker  . Smokeless tobacco: Never Used  . Alcohol use No  . Drug use: No  . Sexual activity: Not Currently   Other Topics Concern  . Not on file   Social History Narrative  . No narrative on file     Current Outpatient Prescriptions:  .  albuterol (PROVENTIL HFA;VENTOLIN HFA) 108 (90 Base) MCG/ACT inhaler, Inhale 2 puffs into the lungs every 6 (six) hours as needed for wheezing or shortness of breath., Disp: 1 Inhaler, Rfl: 2 .  cyclobenzaprine (FLEXERIL) 5 MG tablet, Take 1 tablet by mouth at bedtime. Reported on 04/19/2016, Disp: , Rfl:  .  Eluxadoline (VIBERZI) 75 MG TABS, Take 1 tablet by mouth 2 (two) times  daily., Disp: 60 tablet, Rfl: 0 .  fexofenadine (ALLEGRA ALLERGY) 180 MG tablet, Take 1 tablet (180 mg total) by mouth as needed., Disp: 30 tablet, Rfl: 5 .  fluticasone (FLONASE) 50 MCG/ACT nasal spray, instill 2 spray into each nostril at bedtime, Disp: 16 g, Rfl: 2 .  hyoscyamine (LEVSIN, ANASPAZ) 0.125 MG tablet, take 1 tablet by mouth every 4 hours if needed, Disp: 30 tablet, Rfl: 2 .  naproxen (NAPROSYN) 500 MG tablet, Take 1 tablet (500 mg total) by mouth 2 (two) times daily with a meal., Disp: 30 tablet, Rfl: 0 .  omeprazole (PRILOSEC) 40 MG capsule, Take 1 capsule (40 mg total) by mouth daily., Disp: 30 capsule, Rfl: 5 .  ranitidine (ZANTAC) 300 MG tablet, Take 1 tablet (300 mg total) by mouth at bedtime., Disp: 30  tablet, Rfl: 2  Allergies  Allergen Reactions  . Apple Hives, Shortness Of Breath and Swelling  . Daucus Carota Hives, Shortness Of Breath and Swelling  . Other Hives, Shortness Of Breath and Swelling    Pears, avacados, nuts  . Avocado     and raw fruit and vegetables     ROS  Ten systems reviewed and is negative except as mentioned in HPI   Objective  Vitals:   05/18/16 1520  BP: 104/68  Pulse: 79  Resp: 18  Temp: 97.9 F (36.6 C)  SpO2: 98%  Weight: 125 lb 8 oz (56.9 kg)  Height: 5\' 3"  (1.6 m)    Body mass index is 22.23 kg/m.  Physical Exam  Constitutional: Patient appears well-developed and well-nourished.  No distress.  HEENT: head atraumatic, normocephalic, pupils equal and reactive to light, neck supple, throat within normal limits Cardiovascular: Normal rate, regular rhythm and normal heart sounds.  No murmur heard. No BLE edema. Pulmonary/Chest: Effort normal and breath sounds normal. No respiratory distress. Abdominal: Soft.  There is no tenderness. Breast exam: no masses, nipple discharge or lymphadenopathy but it is tender to touch Psychiatric: Patient has a normal mood and affect. behavior is normal. Judgment and thought content normal.  Recent Results (from the past 2160 hour(s))  Lipase     Status: None   Collection Time: 04/19/16 10:40 AM  Result Value Ref Range   Lipase 41 0 - 59 U/L  Comprehensive Metabolic Panel (CMET)     Status: None   Collection Time: 04/19/16 10:40 AM  Result Value Ref Range   Glucose 89 65 - 99 mg/dL   BUN 10 6 - 24 mg/dL   Creatinine, Ser 0.65 0.57 - 1.00 mg/dL   GFR calc non Af Amer 106 >59 mL/min/1.73   GFR calc Af Amer 122 >59 mL/min/1.73   BUN/Creatinine Ratio 15 9 - 23   Sodium 142 134 - 144 mmol/L   Potassium 3.9 3.5 - 5.2 mmol/L   Chloride 100 96 - 106 mmol/L   CO2 27 18 - 29 mmol/L   Calcium 9.3 8.7 - 10.2 mg/dL   Total Protein 6.5 6.0 - 8.5 g/dL   Albumin 4.1 3.5 - 5.5 g/dL   Globulin, Total 2.4 1.5 -  4.5 g/dL   Albumin/Globulin Ratio 1.7 1.2 - 2.2   Bilirubin Total 0.8 0.0 - 1.2 mg/dL   Alkaline Phosphatase 59 39 - 117 IU/L   AST 13 0 - 40 IU/L   ALT 13 0 - 32 IU/L  CBC     Status: None   Collection Time: 04/19/16 10:40 AM  Result Value Ref Range   WBC 6.6 3.4 -  10.8 x10E3/uL   RBC 4.53 3.77 - 5.28 x10E6/uL   Hemoglobin 13.2 11.1 - 15.9 g/dL   Hematocrit 38.7 34.0 - 46.6 %   MCV 85 79 - 97 fL   MCH 29.1 26.6 - 33.0 pg   MCHC 34.1 31.5 - 35.7 g/dL   RDW 14.0 12.3 - 15.4 %   Platelets 265 150 - 379 x10E3/uL  VITAMIN D 25 Hydroxy (Vit-D Deficiency, Fractures)     Status: Abnormal   Collection Time: 04/19/16 10:40 AM  Result Value Ref Range   Vit D, 25-Hydroxy 22.4 (L) 30.0 - 100.0 ng/mL    Comment: Vitamin D deficiency has been defined by the [place] and an Endocrine Society practice guideline as a level of serum 25-OH vitamin D less than 20 ng/mL (1,2). The Endocrine Society went on to further define vitamin D insufficiency as a level between 21 and 29 ng/mL (2). 1. IOM (Institute of Medicine). 2010. Dietary reference    intakes for calcium and D. [place]: The    [employer]. 2. Holick MF, Binkley [place], Bischoff-Ferrari HA, et al.    Evaluation, treatment, and prevention of vitamin D    deficiency: an Endocrine Society clinical practice    guideline. JCEM. 2011 Jul; 96(7):1911-30.   H. pylori breath test     Status: None   Collection Time: 04/19/16 10:44 AM  Result Value Ref Range   H. pylori UBiT Negative Negative      PHQ2/9: Depression screen [facility] 2/9 05/18/2016 04/19/2016 11/11/2015 07/12/2015 05/03/2015  Decreased Interest 0 0 0 - 0  Down, Depressed, Hopeless 0 0 0 0 0  PHQ - 2 Score 0 0 0 0 0    Fall Risk: Fall Risk  05/18/2016 04/19/2016 11/11/2015 07/12/2015 05/03/2015  Falls in the past year? No No No No No    Functional Status Survey: Is the patient deaf or have difficulty hearing?: No Does the patient have difficulty seeing,  even when wearing glasses/contacts?: No Does the patient have difficulty concentrating, remembering, or making decisions?: No Does the patient have difficulty walking or climbing stairs?: No Does the patient have difficulty dressing or bathing?: No Does the patient have difficulty doing errands alone such as visiting a doctor's office or shopping?: No    Assessment & Plan  1. Irritable bowel syndrome with diarrhea  Advised not to strain, increase fiber in diet and water. Continue medication   2. Pain of left breast  Call back for referral to surgeon if symptoms does not resolve. She is worried because mammogram letter states her breast is dense - naproxen (NAPROSYN) 500 MG tablet; Take 1 tablet (500 mg total) by mouth 2 (two) times daily with a meal.  Dispense: 30 tablet; Refill: 0

## 2016-06-27 ENCOUNTER — Telehealth: Payer: Self-pay | Admitting: Family Medicine

## 2016-07-01 ENCOUNTER — Other Ambulatory Visit: Payer: Self-pay | Admitting: Family Medicine

## 2016-07-01 DIAGNOSIS — N644 Mastodynia: Secondary | ICD-10-CM

## 2016-07-04 ENCOUNTER — Encounter: Payer: Self-pay | Admitting: *Deleted

## 2016-07-05 ENCOUNTER — Ambulatory Visit (INDEPENDENT_AMBULATORY_CARE_PROVIDER_SITE_OTHER): Payer: 59 | Admitting: General Surgery

## 2016-07-05 ENCOUNTER — Encounter: Payer: Self-pay | Admitting: General Surgery

## 2016-07-05 VITALS — BP 124/76 | HR 72 | Resp 12 | Ht 62.0 in | Wt 128.0 lb

## 2016-07-05 DIAGNOSIS — N644 Mastodynia: Secondary | ICD-10-CM | POA: Diagnosis not present

## 2016-07-05 NOTE — Patient Instructions (Addendum)
The patient is aware to call back for any questions or concerns. Naprosyn or anti inflammatory of choice as needed for comfort. Recommend wearing a sports bra for support.

## 2016-07-05 NOTE — Progress Notes (Addendum)
Patient ID: Helen Green, female   DOB: [birth year], 50 y.o.   MRN: [id number]  Chief Complaint  Patient presents with  . Breast Problem    pain    HPI Helen Green is a 50 y.o. female. who presents for a breast evaluation. The most recent mammogram was done on 04-20-16.   Patient does perform regular self breast checks and gets regular mammograms done.    She states she is having bialteral breast pain since her mammogram. It starts laterally and comes to the front. Worse with bending over. Denies breast lumps.  No nipple discharge. Pain is stabbing that comes and goes and aggravated by pressure lasting a few minutes.  Denies respiratory distress. I have reviewed the history of present illness with the patient.   HPI  Past Medical History:  Diagnosis Date  . Allergy   . Chronic abdominal pain   . Chronic insomnia   . Food allergy   . Functional dyspepsia   . GERD (gastroesophageal reflux disease)   . IBS (irritable bowel syndrome)   . Insomnia   . Irritable bowel syndrome with diarrhea   . Left arm weakness   . Neck pain   . Subacute maxillary sinusitis   . Vitamin A deficiency     Past Surgical History:  Procedure Laterality Date  . ABDOMINAL HYSTERECTOMY    . TUBAL LIGATION      Family History  Problem Relation Age of Onset  . Diabetes Mother   . Hyperlipidemia Mother   . Hypertension Mother   . Liver disease Father   . Breast cancer Neg Hx     Social History Social History  Substance Use Topics  . Smoking status: Never Smoker  . Smokeless tobacco: Never Used  . Alcohol use No    Allergies  Allergen Reactions  . Apple Hives, Shortness Of Breath and Swelling  . Daucus Carota Hives, Shortness Of Breath and Swelling  . Other Hives, Shortness Of Breath and Swelling    Pears, avacados, nuts  . Avocado     and raw fruit and vegetables    Current Outpatient Prescriptions  Medication Sig Dispense Refill  . albuterol (PROVENTIL HFA;VENTOLIN HFA) 108  (90 Base) MCG/ACT inhaler Inhale 2 puffs into the lungs every 6 (six) hours as needed for wheezing or shortness of breath. 1 Inhaler 2  . Eluxadoline (VIBERZI) 75 MG TABS Take 1 tablet by mouth 2 (two) times daily. 60 tablet 0  . fexofenadine (ALLEGRA ALLERGY) 180 MG tablet Take 1 tablet (180 mg total) by mouth as needed. 30 tablet 5  . fluticasone (FLONASE) 50 MCG/ACT nasal spray instill 2 spray into each nostril at bedtime (Patient taking differently: instill 2 spray into each nostril at bedtime as needed) 16 g 2  . hyoscyamine (LEVSIN, ANASPAZ) 0.125 MG tablet take 1 tablet by mouth every 4 hours if needed 30 tablet 2  . naproxen (NAPROSYN) 500 MG tablet Take 1 tablet (500 mg total) by mouth 2 (two) times daily with a meal. 30 tablet 0  . omeprazole (PRILOSEC) 40 MG capsule Take 1 capsule (40 mg total) by mouth daily. 30 capsule 5  . venlafaxine XR (EFFEXOR-XR) 37.5 MG 24 hr capsule Take 37.5 mg by mouth daily with breakfast.     No current facility-administered medications for this visit.     Review of Systems Review of Systems  Constitutional: Negative.   Respiratory: Negative.   Cardiovascular: Negative.     Blood pressure 124/76, pulse 72, resp. rate  12, height 5\' 2"  (1.575 m), weight 128 lb (58.1 kg).  Physical Exam Physical Exam  Constitutional: She is oriented to person, place, and time. She appears well-developed and well-nourished.  HENT:  Mouth/Throat: Oropharynx is clear and moist.  Eyes: Conjunctivae are normal. No scleral icterus.  Neck: Neck supple.  Cardiovascular: Normal rate, regular rhythm and normal heart sounds.   Pulmonary/Chest: Effort normal and breath sounds normal. Right breast exhibits tenderness. Right breast exhibits no inverted nipple, no mass, no nipple discharge and no skin change. Left breast exhibits tenderness. Left breast exhibits no inverted nipple, no mass, no nipple discharge and no skin change.  Tender along axillary fold left > right   Abdominal: Soft.  Lymphadenopathy:    She has no cervical adenopathy.    She has no axillary adenopathy.  Neurological: She is alert and oriented to person, place, and time.  Skin: Skin is warm and dry.  Psychiatric: Her behavior is normal.    Data Reviewed Mammogram reviewed and stable.  Assessment    Benign breast exam. Pain location appears to involve the anterior axillary fold and is likely muscular in origin. Pt advised. Recommended use of AIs prn.     Plan    Follow up in 2 months. Naprosyn or anti inflammatory of choice as needed for comfort. Recommend wearing a sports bra for support.     This information has been scribed by Karie Fetch RN, BSN,BC. The patient is aware to call back for any questions or concerns.   SANKAR,SEEPLAPUTHUR G 07/12/2016, 7:57 AM

## 2016-07-06 NOTE — Telephone Encounter (Signed)
COMPLETED

## 2016-07-12 ENCOUNTER — Encounter: Payer: Self-pay | Admitting: General Surgery

## 2016-08-01 ENCOUNTER — Telehealth: Payer: Self-pay

## 2016-08-01 NOTE — Telephone Encounter (Signed)
Patient called and states she is having numbess again and has been to the ER for this in the past. But never talked about this problem with Dr. Ancil Boozer. Patient refuses to go to the ER and Urgent Care. But has a follow up coming up with Dr. Ancil Boozer on 08/21/16. Rhonda informed patient she had nothing sooner than her appointment. But patient states she had already went to the ER in the past and all they did was tell her to relax and take this medication to help her relax. Patient could not tell me the name of the medication or when she went to the hospital. Please advise.

## 2016-08-02 NOTE — Telephone Encounter (Signed)
She can be seen tomorrow

## 2016-08-02 NOTE — Telephone Encounter (Signed)
Pt scheduled  

## 2016-08-03 ENCOUNTER — Encounter: Payer: Self-pay | Admitting: Family Medicine

## 2016-08-03 ENCOUNTER — Ambulatory Visit (INDEPENDENT_AMBULATORY_CARE_PROVIDER_SITE_OTHER): Payer: 59 | Admitting: Family Medicine

## 2016-08-03 VITALS — BP 104/68 | HR 73 | Temp 98.2°F | Resp 16 | Ht 63.0 in | Wt 127.5 lb

## 2016-08-03 DIAGNOSIS — Z23 Encounter for immunization: Secondary | ICD-10-CM | POA: Diagnosis not present

## 2016-08-03 DIAGNOSIS — R42 Dizziness and giddiness: Secondary | ICD-10-CM | POA: Diagnosis not present

## 2016-08-03 DIAGNOSIS — R202 Paresthesia of skin: Secondary | ICD-10-CM | POA: Diagnosis not present

## 2016-08-03 DIAGNOSIS — R29898 Other symptoms and signs involving the musculoskeletal system: Secondary | ICD-10-CM

## 2016-08-03 NOTE — Progress Notes (Signed)
Name: Helen Green   MRN: [id number]    DOB: [birth year]   Date:08/03/2016       Progress Note  Subjective  Chief Complaint  Chief Complaint  Patient presents with  . Numbness    pt had left side numbness but did not go to the ER starting 08/01/16 pain and numbness from shoulder down into her arm along with a headache in the back of her head      . 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 She had a MRI of brain and x-ray of c-spine that was negative in Feb 2016. This past Sunday she had to defend herself in front of her church, because the previous weekend her pastor had disclosed some personal things during his sermon to the entire congregation. She is non-confrontational but had to go in front of her church to given her own statement. She will be getting married next March with her previous pastor brother. She felt all the symptoms including some vertigo in the past 48 hours. She is feeling better now, no longer weak, but has some numbness. She is feeling more calm now. Discussed therapy, versus neurologist evaluation, psychiatrist evaluation. She does not want anything at this time   Patient Active Problem List   Diagnosis Date Noted  . Insomnia, persistent 05/03/2015  . Functional dyspepsia 05/03/2015  . Gastro-esophageal reflux disease without esophagitis 05/03/2015  . History of anemia 05/03/2015  . Perennial allergic rhinitis with seasonal variation 05/03/2015  . 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 Vitamin D deficiency 05/03/2015  . Irritable bowel syndrome with diarrhea 05/03/2015    Past Surgical History:  Procedure Laterality Date  . ABDOMINAL HYSTERECTOMY    . TUBAL LIGATION      Family History  Problem Relation Age of Onset  . Diabetes Mother   . Hyperlipidemia  Mother   . Hypertension Mother   . Liver disease Father   . Breast cancer Neg Hx     Social History   Social History  . Marital status: Divorced    Spouse name: N/A  . Number of children: N/A  . Years of education: N/A   Occupational History  . Not on file.   Social History Main Topics  . Smoking status: Never Smoker  . Smokeless tobacco: Never Used  . Alcohol use No  . Drug use: No  . Sexual activity: Not Currently   Other Topics Concern  . Not on file   Social History Narrative  . No narrative on file     Current Outpatient Prescriptions:  .  albuterol (PROVENTIL HFA;VENTOLIN HFA) 108 (90 Base) MCG/ACT inhaler, Inhale 2 puffs into the lungs every 6 (six) hours as needed for wheezing or shortness of breath., Disp: 1 Inhaler, Rfl: 2 .  Eluxadoline (VIBERZI) 75 MG TABS, Take 1 tablet by mouth 2 (two) times daily., Disp: 60 tablet, Rfl: 0 .  fexofenadine (ALLEGRA ALLERGY) 180 MG tablet, Take 1 tablet (180 mg total) by mouth as needed., Disp: 30 tablet, Rfl: 5 .  fluticasone (FLONASE) 50 MCG/ACT nasal spray, instill 2 spray into each nostril at bedtime (Patient taking differently: instill 2 spray into each nostril at bedtime as needed), Disp: 16 g, Rfl: 2 .  hyoscyamine (LEVSIN, ANASPAZ) 0.125 MG tablet, take 1 tablet  by mouth every 4 hours if needed, Disp: 30 tablet, Rfl: 2 .  naproxen (NAPROSYN) 500 MG tablet, Take 1 tablet (500 mg total) by mouth 2 (two) times daily with a meal., Disp: 30 tablet, Rfl: 0 .  omeprazole (PRILOSEC) 40 MG capsule, Take 1 capsule (40 mg total) by mouth daily., Disp: 30 capsule, Rfl: 5 .  venlafaxine XR (EFFEXOR-XR) 37.5 MG 24 hr capsule, Take 37.5 mg by mouth daily with breakfast., Disp: , Rfl:   Allergies  Allergen Reactions  . Apple Hives, Shortness Of Breath and Swelling  . Daucus Carota Hives, Shortness Of Breath and Swelling  . Other Hives, Shortness Of Breath and Swelling    Pears, avacados, nuts  . Avocado     and raw fruit and  vegetables     ROS  Constitutional: Negative for fever or weight change.  Respiratory: Negative for cough and shortness of breath.   Cardiovascular: Negative for chest pain or palpitations.  Gastrointestinal: Negative for abdominal pain, no bowel changes.  Musculoskeletal: Negative for gait problem or joint swelling.  Skin: Negative for rash.  Neurological: Negative for dizziness or headache.  No other specific complaints in a complete review of systems (except as listed in HPI above).  Objective  Vitals:   08/03/16 1527  BP: 104/68  Pulse: 73  Resp: 16  Temp: 98.2 F (36.8 C)  SpO2: 96%  Weight: 127 lb 8 oz (57.8 kg)  Height: 5\' 3"  (1.6 m)    Body mass index is 22.59 kg/m.  Physical Exam  Constitutional: Patient appears well-developed and well-nourished. Obese No distress.  HEENT: head atraumatic, normocephalic, pupils equal and reactive to light,neck supple, throat within normal limits Cardiovascular: Normal rate, regular rhythm and normal heart sounds.  No murmur heard. No BLE edema. Pulmonary/Chest: Effort normal and breath sounds normal. No respiratory distress. Abdominal: Soft.  There is no tenderness. Psychiatric: Patient has a normal mood and affect. behavior is normal. Judgment and thought content normal. Neurological exam: normal sensation, cranial nerves , strength and balance   PHQ2/9: Depression screen [facility] 2/9 08/03/2016 05/18/2016 04/19/2016 11/11/2015 07/12/2015  Decreased Interest 0 0 0 0 -  Down, Depressed, Hopeless 0 0 0 0 0  PHQ - 2 Score 0 0 0 0 0     Fall Risk: Fall Risk  08/03/2016 05/18/2016 04/19/2016 11/11/2015 07/12/2015  Falls in the past year? No No No No No     Functional Status Survey: Is the patient deaf or have difficulty hearing?: No Does the patient have difficulty seeing, even when wearing glasses/contacts?: No Does the patient have difficulty concentrating, remembering, or making decisions?: No Does the patient have difficulty  walking or climbing stairs?: No Does the patient have difficulty dressing or bathing?: No Does the patient have difficulty doing errands alone such as visiting a doctor's office or shopping?: No    Assessment & Plan  1. Paresthesia  Improved, normal neurological exam   2. Need for influenza vaccination  - Flu Vaccine QUAD 36+ mos PF IM (Fluarix & Fluzone Quad PF)  3. Left arm weakness   4. Vertigo  Likely conversion disorder, explained to her, offered referral but she would like to hold off for now

## 2016-08-03 NOTE — Patient Instructions (Addendum)
Read about conversion disorder Discussed head space ap

## 2016-08-07 ENCOUNTER — Encounter: Payer: Self-pay | Admitting: General Surgery

## 2016-08-07 ENCOUNTER — Ambulatory Visit (INDEPENDENT_AMBULATORY_CARE_PROVIDER_SITE_OTHER): Payer: 59 | Admitting: General Surgery

## 2016-08-07 VITALS — BP 126/74 | HR 70 | Resp 12 | Ht 60.0 in | Wt 127.0 lb

## 2016-08-07 DIAGNOSIS — N644 Mastodynia: Secondary | ICD-10-CM

## 2016-08-07 MED ORDER — MELOXICAM 7.5 MG PO TABS: 7.5000 mg | ORAL_TABLET | Freq: Every day | ORAL | 0 refills | Status: DC

## 2016-08-07 NOTE — Patient Instructions (Addendum)
The patient is aware to call back for any questions or concerns. Return in one month 

## 2016-08-07 NOTE — Progress Notes (Signed)
Patient ID: Helen Green, female   DOB: 1966-07-04, 50 y.o.   MRN: [id number]  Chief Complaint  Patient presents with  . Follow-up    HPI Helen Green is a 50 y.o. female.  Here today for follow up bilateral breast pain. She states the pain has been going for 3 months. Last mammogram 04/20/16. She is having bialteral breast pain since her mammogram. It starts laterally and comes to the front. Worse with bending over. She was seen here lat month and noted pain to be arising from the pectoral folds. She did try Aleve but not ondaily basis.Some help with Aleve. I have reviewed the history of present illness with the patient.  HPI  Past Medical History:  Diagnosis Date  . Allergy   . Chronic abdominal pain   . Chronic insomnia   . Food allergy   . Functional dyspepsia   . GERD (gastroesophageal reflux disease)   . IBS (irritable bowel syndrome)   . Insomnia   . Irritable bowel syndrome with diarrhea   . Left arm weakness   . Neck pain   . Subacute maxillary sinusitis   . Vitamin A deficiency     Past Surgical History:  Procedure Laterality Date  . ABDOMINAL HYSTERECTOMY    . TUBAL LIGATION      Family History  Problem Relation Age of Onset  . Diabetes Mother   . Hyperlipidemia Mother   . Hypertension Mother   . Liver disease Father   . Breast cancer Neg Hx     Social History Social History  Substance Use Topics  . Smoking status: Never Smoker  . Smokeless tobacco: Never Used  . Alcohol use No    Allergies  Allergen Reactions  . Apple Hives, Shortness Of Breath and Swelling  . Daucus Carota Hives, Shortness Of Breath and Swelling  . Other Hives, Shortness Of Breath and Swelling    Pears, avacados, nuts  . Avocado     and raw fruit and vegetables    Current Outpatient Prescriptions  Medication Sig Dispense Refill  . albuterol (PROVENTIL HFA;VENTOLIN HFA) 108 (90 Base) MCG/ACT inhaler Inhale 2 puffs into the lungs every 6 (six) hours as needed for wheezing  or shortness of breath. 1 Inhaler 2  . Eluxadoline (VIBERZI) 75 MG TABS Take 1 tablet by mouth 2 (two) times daily. 60 tablet 0  . fexofenadine (ALLEGRA ALLERGY) 180 MG tablet Take 1 tablet (180 mg total) by mouth as needed. 30 tablet 5  . fluticasone (FLONASE) 50 MCG/ACT nasal spray instill 2 spray into each nostril at bedtime (Patient taking differently: instill 2 spray into each nostril at bedtime as needed) 16 Green 2  . hyoscyamine (LEVSIN, ANASPAZ) 0.125 MG tablet take 1 tablet by mouth every 4 hours if needed 30 tablet 2  . naproxen (NAPROSYN) 500 MG tablet Take 1 tablet (500 mg total) by mouth 2 (two) times daily with a meal. 30 tablet 0  . omeprazole (PRILOSEC) 40 MG capsule Take 1 capsule (40 mg total) by mouth daily. 30 capsule 5  . venlafaxine XR (EFFEXOR-XR) 37.5 MG 24 hr capsule Take 37.5 mg by mouth daily with breakfast.    . meloxicam (MOBIC) 7.5 MG tablet Take 1 tablet (7.5 mg total) by mouth daily. 30 tablet 0   No current facility-administered medications for this visit.     Review of Systems Review of Systems  Constitutional: Negative.   Respiratory: Negative.   Cardiovascular: Negative.     Blood pressure 126/74, pulse  70, resp. rate 12, height 5' (1.524 m), weight 127 lb (57.6 kg).  Physical Exam Physical Exam  Constitutional: She is oriented to person, place, and time. She appears well-developed and well-nourished.  Neurological: She is alert and oriented to person, place, and time.  Skin: Skin is warm and dry.  Psychiatric: Her behavior is normal.  Still with tenderness over the anterior folds/axillary tail areas on both sides. Symmetrical location, L>R . No palpable breast abnormality No cervical or axillary adenopathy Data Reviewed Prior progress notes  Assessment    Bilateral breast pain centered over the pectoral folds. Likely this is muscular. Pt advised.   Benign breast exam.   Plan Trial of Mobic for 3-4 weeks. Suggested use of a good support bra or  a sports bra   Rx  Sent to pharmacy.Return in one month.  This information has been scribed by Gaspar Cola CMA.        Helen Green 08/07/2016, 11:36 AM

## 2016-08-21 ENCOUNTER — Ambulatory Visit: Payer: 59 | Admitting: Family Medicine

## 2016-08-31 ENCOUNTER — Other Ambulatory Visit: Payer: Self-pay | Admitting: Family Medicine

## 2016-09-04 ENCOUNTER — Encounter: Payer: Self-pay | Admitting: General Surgery

## 2016-09-04 ENCOUNTER — Ambulatory Visit (INDEPENDENT_AMBULATORY_CARE_PROVIDER_SITE_OTHER): Payer: 59 | Admitting: General Surgery

## 2016-09-04 ENCOUNTER — Ambulatory Visit: Payer: 59 | Admitting: General Surgery

## 2016-09-04 VITALS — BP 124/76 | HR 66 | Resp 12 | Ht 63.0 in | Wt 131.0 lb

## 2016-09-04 DIAGNOSIS — N644 Mastodynia: Secondary | ICD-10-CM | POA: Diagnosis not present

## 2016-09-04 NOTE — Progress Notes (Signed)
Patient ID: Helen Green, female   DOB: 02/12/1966, 50 y.o.   MRN: [id number]  Chief Complaint  Patient presents with  . Breast Problem    pain    HPI Helen Green is a 50 y.o. female.  Her today for follow up evaluation of bilateral breast pain. She was given a trial of Mobic at her last visit. She is still using the mobic and states the breast pain is less. I have reviewed the history of present illness with the patient.  HPI  Past Medical History:  Diagnosis Date  . Allergy   . Chronic abdominal pain   . Chronic insomnia   . Food allergy   . Functional dyspepsia   . GERD (gastroesophageal reflux disease)   . IBS (irritable bowel syndrome)   . Insomnia   . Irritable bowel syndrome with diarrhea   . Left arm weakness   . Neck pain   . Subacute maxillary sinusitis   . Vitamin A deficiency     Past Surgical History:  Procedure Laterality Date  . ABDOMINAL HYSTERECTOMY    . TUBAL LIGATION      Family History  Problem Relation Age of Onset  . Diabetes Mother   . Hyperlipidemia Mother   . Hypertension Mother   . Liver disease Father   . Breast cancer Neg Hx     Social History Social History  Substance Use Topics  . Smoking status: Never Smoker  . Smokeless tobacco: Never Used  . Alcohol use No    Allergies  Allergen Reactions  . Apple Hives, Shortness Of Breath and Swelling  . Daucus Carota Hives, Shortness Of Breath and Swelling  . Other Hives, Shortness Of Breath and Swelling    Pears, avacados, nuts  . Avocado     and raw fruit and vegetables    Current Outpatient Prescriptions  Medication Sig Dispense Refill  . albuterol (PROVENTIL HFA;VENTOLIN HFA) 108 (90 Base) MCG/ACT inhaler Inhale 2 puffs into the lungs every 6 (six) hours as needed for wheezing or shortness of breath. 1 Inhaler 2  . Eluxadoline (VIBERZI) 75 MG TABS Take 1 tablet by mouth 2 (two) times daily. 60 tablet 0  . fexofenadine (ALLEGRA ALLERGY) 180 MG tablet Take 1 tablet (180 mg  total) by mouth as needed. 30 tablet 5  . fluticasone (FLONASE) 50 MCG/ACT nasal spray instill 2 spray into each nostril at bedtime (Patient taking differently: instill 2 spray into each nostril at bedtime as needed) 16 g 2  . hyoscyamine (LEVSIN, ANASPAZ) 0.125 MG tablet take 1 tablet by mouth every 4 hours if needed 30 tablet 2  . meloxicam (MOBIC) 7.5 MG tablet Take 1 tablet (7.5 mg total) by mouth daily. 30 tablet 0  . omeprazole (PRILOSEC) 40 MG capsule Take 1 capsule (40 mg total) by mouth daily. 30 capsule 5  . venlafaxine XR (EFFEXOR-XR) 37.5 MG 24 hr capsule Take 37.5 mg by mouth daily with breakfast.    . naproxen (NAPROSYN) 500 MG tablet Take 1 tablet (500 mg total) by mouth 2 (two) times daily with a meal. (Patient not taking: Reported on 09/04/2016) 30 tablet 0   No current facility-administered medications for this visit.     Review of Systems Review of Systems  Constitutional: Negative.   Respiratory: Negative.   Cardiovascular: Negative.     Blood pressure 124/76, pulse 66, resp. rate 12, height 5\' 3"  (1.6 m), weight 131 lb (59.4 kg).  Physical Exam Physical Exam  Constitutional: She is oriented  to person, place, and time. She appears well-developed and well-nourished.  HENT:  Mouth/Throat: Oropharynx is clear and moist.  Eyes: Conjunctivae are normal. No scleral icterus.  Neck: Neck supple.  Cardiovascular: Normal rate, regular rhythm and normal heart sounds.   Pulmonary/Chest: Effort normal and breath sounds normal. Right breast exhibits tenderness. Right breast exhibits no inverted nipple, no mass, no nipple discharge and no skin change. Left breast exhibits tenderness. Left breast exhibits no inverted nipple, no mass, no nipple discharge and no skin change.  Bilateral breast tenderness outer aspects.  Abdominal: Soft. There is no tenderness.  Lymphadenopathy:    She has no cervical adenopathy.    She has no axillary adenopathy.  Neurological: She is alert and  oriented to person, place, and time.  Skin: Skin is warm and dry.  Psychiatric: Her behavior is normal.    Data Reviewed  Prior notes.  Assessment    Mastalgia, better with a course of Mobic     Plan    Recommend reducing the use of the Mobic and start using Aleve/Naprosyn prn for the discomfort.   The patient will be asked to return to the office in 7 months with a bilateral screening mammogram.    This information has been scribed by Karie Fetch RN, BSN,BC.  Kadi Hession G 09/05/2016, 9:16 AM

## 2016-09-04 NOTE — Patient Instructions (Addendum)
The patient is aware to call back for any questions or concerns. Recommend reducing the use of the Mobic and start using Aleve/Naprosyn for the discomfort.   The patient will be asked to return to the office in 7 months with a bilateral screening mammogram.

## 2016-09-05 ENCOUNTER — Encounter: Payer: Self-pay | Admitting: General Surgery

## 2016-10-11 ENCOUNTER — Encounter: Payer: Self-pay | Admitting: Family Medicine

## 2016-10-11 ENCOUNTER — Ambulatory Visit (INDEPENDENT_AMBULATORY_CARE_PROVIDER_SITE_OTHER): Payer: 59 | Admitting: Family Medicine

## 2016-10-11 VITALS — BP 108/64 | HR 86 | Temp 97.8°F | Resp 18 | Ht 63.0 in | Wt 132.9 lb

## 2016-10-11 DIAGNOSIS — N644 Mastodynia: Secondary | ICD-10-CM

## 2016-10-11 DIAGNOSIS — K580 Irritable bowel syndrome with diarrhea: Secondary | ICD-10-CM

## 2016-10-11 DIAGNOSIS — K219 Gastro-esophageal reflux disease without esophagitis: Secondary | ICD-10-CM

## 2016-10-11 MED ORDER — ELUXADOLINE 75 MG PO TABS: 1.0000 | ORAL_TABLET | Freq: Two times a day (BID) | ORAL | 2 refills | Status: DC

## 2016-10-11 NOTE — Progress Notes (Signed)
Name: Helen Green   MRN: [id number]    DOB: 10/30/1965   Date:10/11/2016       Progress Note  Subjective  Chief Complaint  Chief Complaint  Patient presents with  . GI Problem    Chronic pain but the past month has gotten worst one the left side has been bothering her     HPI  IBS : she has a long history of abdominal pain, treated h. Pylori years ago. She is having 2 bowel movements per day.  She has bloating, burping, pain is described as dull or burning at times sharp, it is worse at night, but also present during the day, she is concerned because pain is more on the left flank now but also epigastric. She denies hematuria, dysuria or urinary frequency. Wakes up at night with pain, but no bowel movements during the night. No fever or chills. No change in appetite, weight is stable. She had a CT scan back in 07/2015, but is concerned about her pancreas because sometimes the pain goes to left side of back. Symptoms got worse when she started to date again 9 months ago. He is a widow. Had problems at her previous church with her future brother-in-law. Stress has improved, she did not noticed improvement with Effexor and stopped medication, but pain seems to have improved when she was taking Viberzi - however too expensive  Breast pain: seen by Dr. Jamal Collin and took Meloxicam and is doing better now , using heating pad prn.   Paresthesia: stress is lower and paresthesia has resolved, likely conversion disorder  Patient Active Problem List   Diagnosis Date Noted  . Insomnia, persistent 05/03/2015  . Functional dyspepsia 05/03/2015  . Gastro-esophageal reflux disease without esophagitis 05/03/2015  . History of anemia 05/03/2015  . Perennial allergic rhinitis with seasonal variation 05/03/2015  . 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 Vitamin D deficiency 05/03/2015  . Irritable bowel syndrome with diarrhea 05/03/2015    Past Surgical History:  Procedure Laterality Date  .  ABDOMINAL HYSTERECTOMY    . TUBAL LIGATION      Family History  Problem Relation Age of Onset  . Diabetes Mother   . Hyperlipidemia Mother   . Hypertension Mother   . Liver disease Father   . Breast cancer Neg Hx     Social History   Social History  . Marital status: Divorced    Spouse name: N/A  . Number of children: N/A  . Years of education: N/A   Occupational History  . Not on file.   Social History Main Topics  . Smoking status: Never Smoker  . Smokeless tobacco: Never Used  . Alcohol use No  . Drug use: No  . Sexual activity: Not Currently   Other Topics Concern  . Not on file   Social History Narrative  . No narrative on file     Current Outpatient Prescriptions:  .  albuterol (PROVENTIL HFA;VENTOLIN HFA) 108 (90 Base) MCG/ACT inhaler, Inhale 2 puffs into the lungs every 6 (six) hours as needed for wheezing or shortness of breath., Disp: 1 Inhaler, Rfl: 2 .  Eluxadoline (VIBERZI) 75 MG TABS, Take 1 tablet by mouth 2 (two) times daily., Disp: 60 tablet, Rfl: 2 .  fexofenadine (ALLEGRA ALLERGY) 180 MG tablet, Take 1 tablet (180 mg total) by mouth as needed., Disp: 30 tablet, Rfl: 5 .  fluticasone (FLONASE) 50 MCG/ACT nasal spray, instill 2 spray into each nostril at bedtime (Patient taking differently: instill 2  spray into each nostril at bedtime as needed), Disp: 16 g, Rfl: 2 .  hyoscyamine (LEVSIN, ANASPAZ) 0.125 MG tablet, take 1 tablet by mouth every 4 hours if needed, Disp: 30 tablet, Rfl: 2 .  omeprazole (PRILOSEC) 40 MG capsule, Take 1 capsule (40 mg total) by mouth daily., Disp: 30 capsule, Rfl: 5  Allergies  Allergen Reactions  . Apple Hives, Shortness Of Breath and Swelling  . Daucus Carota Hives, Shortness Of Breath and Swelling  . Other Hives, Shortness Of Breath and Swelling    Pears, avacados, nuts  . Avocado     and raw fruit and vegetables     ROS  Constitutional: Negative for fever or weight change.  Respiratory: Negative for cough  and shortness of breath.   Cardiovascular: Negative for chest pain or palpitations.  Gastrointestinal: Positive for abdominal pain, no bowel changes ( she has IBS ) .  Musculoskeletal: Negative for gait problem or joint swelling.  Skin: Negative for rash.  Neurological: Negative for dizziness or headache.  No other specific complaints in a complete review of systems (except as listed in HPI above).  Objective  Vitals:   10/11/16 1127  BP: 108/64  Pulse: 86  Resp: 18  Temp: 97.8 F (36.6 C)  TempSrc: Oral  SpO2: 98%  Weight: 132 lb 14.4 oz (60.3 kg)  Height: 5\' 3"  (1.6 m)    Body mass index is 23.54 kg/m.  Physical Exam  Constitutional: Patient appears well-developed and well-nourished.  No distress.  HEENT: head atraumatic, normocephalic, pupils equal and reactive to light,  neck supple, throat within normal limits Cardiovascular: Normal rate, regular rhythm and normal heart sounds.  No murmur heard. No BLE edema. Pulmonary/Chest: Effort normal and breath sounds normal. No respiratory distress. Abdominal: Soft.  There is tenderness during palpation of abdomen diffusely Psychiatric: Patient has a normal mood and affect. behavior is normal. Judgment and thought content normal.  PHQ2/9: Depression screen [facility] 2/9 10/11/2016 08/03/2016 05/18/2016 04/19/2016 11/11/2015  Decreased Interest 0 0 0 0 0  Down, Depressed, Hopeless 0 0 0 0 0  PHQ - 2 Score 0 0 0 0 0     Fall Risk: Fall Risk  10/11/2016 08/03/2016 05/18/2016 04/19/2016 11/11/2015  Falls in the past year? No No No No No     Functional Status Survey: Is the patient deaf or have difficulty hearing?: No Does the patient have difficulty seeing, even when wearing glasses/contacts?: No Does the patient have difficulty concentrating, remembering, or making decisions?: No Does the patient have difficulty walking or climbing stairs?: No Does the patient have difficulty dressing or bathing?: No Does the patient have  difficulty doing errands alone such as visiting a doctor's office or shopping?: No   Assessment & Plan  1. Irritable bowel syndrome with diarrhea  Advised not to get CT done, she has seen multiple doctors and had a CT abdomen and pelvis 07/2015 - she improved with Viberzi but prescription not covered by her insurance, we will give her a voucher today. - Eluxadoline (VIBERZI) 75 MG TABS; Take 1 tablet by mouth 2 (two) times daily.  Dispense: 60 tablet; Refill: 2  2. Gastro-esophageal reflux disease without esophagitis  Continue medication  3. Pain of left breast  Continue recommendations given by Dr. Jamal Collin

## 2016-11-26 ENCOUNTER — Telehealth: Payer: Self-pay | Admitting: Family Medicine

## 2016-11-26 ENCOUNTER — Other Ambulatory Visit: Payer: Self-pay | Admitting: Family Medicine

## 2016-11-26 DIAGNOSIS — R1084 Generalized abdominal pain: Secondary | ICD-10-CM

## 2016-11-26 DIAGNOSIS — K580 Irritable bowel syndrome with diarrhea: Secondary | ICD-10-CM

## 2016-11-26 NOTE — Telephone Encounter (Signed)
PT SHE NEEDS A REFERRAL. SAYS THAT HER AND THE DR HAVE TALKED ABOUT SENDING HER TO A GASTRO DR AND SHE IS NOW READY.

## 2016-11-27 DIAGNOSIS — S99922A Unspecified injury of left foot, initial encounter: Secondary | ICD-10-CM | POA: Diagnosis not present

## 2016-11-27 DIAGNOSIS — M79672 Pain in left foot: Secondary | ICD-10-CM | POA: Diagnosis not present

## 2016-11-27 DIAGNOSIS — M25572 Pain in left ankle and joints of left foot: Secondary | ICD-10-CM | POA: Diagnosis not present

## 2016-11-27 DIAGNOSIS — S99912A Unspecified injury of left ankle, initial encounter: Secondary | ICD-10-CM | POA: Diagnosis not present

## 2016-12-31 ENCOUNTER — Ambulatory Visit: Payer: 59 | Admitting: Gastroenterology

## 2017-01-02 ENCOUNTER — Ambulatory Visit: Payer: 59 | Admitting: Gastroenterology

## 2017-01-02 ENCOUNTER — Encounter: Payer: Self-pay | Admitting: Gastroenterology

## 2017-01-30 ENCOUNTER — Encounter: Payer: Self-pay | Admitting: Gastroenterology

## 2017-01-30 ENCOUNTER — Ambulatory Visit (INDEPENDENT_AMBULATORY_CARE_PROVIDER_SITE_OTHER): Payer: 59 | Admitting: Gastroenterology

## 2017-01-30 VITALS — BP 104/65 | HR 71 | Temp 97.7°F | Ht 63.0 in | Wt 134.8 lb

## 2017-01-30 DIAGNOSIS — K30 Functional dyspepsia: Secondary | ICD-10-CM

## 2017-01-30 DIAGNOSIS — R140 Abdominal distension (gaseous): Secondary | ICD-10-CM

## 2017-01-30 NOTE — Progress Notes (Signed)
Gastroenterology Consultation  Referring Provider:     Steele Sizer, MD Primary Care Physician:  Loistine Chance, MD Primary Gastroenterologist:  Dr. Jonathon Bellows  Reason for Consultation:     Diarrhea         HPI:   Helen Green is a 51 y.o. y/o female referred for consultation & management  by Dr. Loistine Chance, MD.    He has been seen by [facility] back in 2015 for dyspepsia. H pylori breath test was negative. CT abdomen in 07/2015 for abdominal pain was negative.   She says she is here for abdominal pain and not diarrhea.   Abdominal pain: Onset: > 15 years, gradually got worse, every day , comes and goes, each episode lasts hours .  Site :left side of her abdomen and epigastrium in the past as well.  Radiation: left side of her back  Severity :at times it is 7/10  Nature of pain: sharp in nature.  Aggravating factors: stress, certain foods. Anything with "acid" , pain is usually after 10 mins , sodas make it worse right away  Relieving factors :nothing  Weight loss: no  NSAID use: not regularly  PPI use :she has used nexium, zantac, omeprazole, has worked on and off, Presently she is taking ranitidine 150 mg  Once a day - helps very little  Gall bladder surgery: intact Frequency of bowel movements: everyday , soft  Pain does get better at times after a bowel movement  Gas/Bloating/Abdominal distension: a lot , distension, pain is worse when she is bloated.   No artificial sugars in her diet .   She has had a colonoscopy some years back at [facility] and it was normal . EGD showed gastritis many years back .    She has not tried peppermint oil. Not been treated for SIBO in the past.     Past Medical History:  Diagnosis Date  . Allergy   . Chronic abdominal pain   . Chronic insomnia   . Food allergy   . Functional dyspepsia   . GERD (gastroesophageal reflux disease)   . IBS (irritable bowel syndrome)   . Insomnia   . Irritable bowel syndrome with diarrhea   . Left  arm weakness   . Neck pain   . Subacute maxillary sinusitis   . Vitamin A deficiency     Past Surgical History:  Procedure Laterality Date  . ABDOMINAL HYSTERECTOMY    . TUBAL LIGATION      Prior to Admission medications   Medication Sig Start Date End Date Taking? Authorizing Provider  albuterol (PROVENTIL HFA;VENTOLIN HFA) 108 (90 Base) MCG/ACT inhaler Inhale 2 puffs into the lungs every 6 (six) hours as needed for wheezing or shortness of breath. 03/26/16  Yes Adline Potter, MD  fexofenadine [facility] ALLERGY) 180 MG tablet Take 1 tablet (180 mg total) by mouth as needed. 07/12/15  Yes Steele Sizer, MD  fluticasone (FLONASE) 50 MCG/ACT nasal spray instill 2 spray into each nostril at bedtime Patient taking differently: instill 2 spray into each nostril at bedtime as needed 01/31/16  Yes Steele Sizer, MD  hyoscyamine (LEVSIN, ANASPAZ) 0.125 MG tablet take 1 tablet by mouth every 4 hours if needed 09/01/16  Yes Steele Sizer, MD  Eluxadoline (VIBERZI) 75 MG TABS Take 1 tablet by mouth 2 (two) times daily. Patient not taking: Reported on 01/30/2017 10/11/16   Steele Sizer, MD  ibuprofen (ADVIL,MOTRIN) 800 MG tablet take 1 tablet by mouth every 8 hours if  needed for pain for UP TO 10 DAYS 11/27/16   Historical Provider, MD  omeprazole (PRILOSEC) 40 MG capsule Take 1 capsule (40 mg total) by mouth daily. Patient not taking: Reported on 01/30/2017 07/12/15   Steele Sizer, MD  ranitidine (ZANTAC) 150 MG tablet Take by mouth.    Historical Provider, MD    Family History  Problem Relation Age of Onset  . Diabetes Mother   . Hyperlipidemia Mother   . Hypertension Mother   . Liver disease Father   . Breast cancer Neg Hx      Social History  Substance Use Topics  . Smoking status: Never Smoker  . Smokeless tobacco: Never Used  . Alcohol use No    Allergies as of 01/30/2017 - Review Complete 01/30/2017  Allergen Reaction Noted  . Apple Hives, Shortness Of Breath, and Swelling  05/03/2015  . Daucus carota Hives, Shortness Of Breath, and Swelling 05/03/2015  . Other Hives, Shortness Of Breath, and Swelling 05/03/2015  . Avocado  05/03/2015    Review of Systems:    All systems reviewed and negative except where noted in HPI.   Physical Exam:  BP 104/65   Pulse 71   Temp 97.7 F (36.5 C) (Oral)   Ht 5\' 3"  (1.6 m)   Wt 134 lb 12.8 oz (61.1 kg)   BMI 23.88 kg/m  No LMP recorded. Patient has had a hysterectomy. Psych:  Alert and cooperative. Normal mood and affect. General:   Alert,  Well-developed, well-nourished, pleasant and cooperative in NAD Head:  Normocephalic and atraumatic. Eyes:  Sclera clear, no icterus.   Conjunctiva pink. Ears:  Normal auditory acuity. Nose:  No deformity, discharge, or lesions. Mouth:  No deformity or lesions,oropharynx pink & moist. Neck:  Supple; no masses or thyromegaly. Lungs:  Respirations even and unlabored.  Clear throughout to auscultation.   No wheezes, crackles, or rhonchi. No acute distress. Heart:  Regular rate and rhythm; no murmurs, clicks, rubs, or gallops. Abdomen:  Normal bowel sounds.  No bruits.  Soft, non-tender and non-distended without masses, hepatosplenomegaly or hernias noted.  No guarding or rebound tenderness.    Msk:  Symmetrical without gross deformities. Good, equal movement & strength bilaterally. Pulses:  Normal pulses noted. Extremities:  No clubbing or edema.  No cyanosis. Neurologic:  Alert and oriented x3;  grossly normal neurologically. Skin:  Intact without significant lesions or rashes. No jaundice. Lymph Nodes:  No significant cervical adenopathy. Psych:  Alert and cooperative. Normal mood and affect.  Imaging Studies: No results found.  Assessment and Plan:   Helen Green is a 51 y.o. y/o female has been referred for abdominal pain.   Plan   1. LOW FODMAP diet  2. Doxycycline for 2 weeks , she prefers to be tested for the same before treatment. I will refer her to [facility] or [facility]  to be tested for SIBO  3. Celiac serology 3. Trial of IB guard - samples provided  4. If no better at next visit may need CT scan of he abdomen and EGD.   Follow up in 8-10 weeks   Dr Jonathon Bellows MD

## 2017-02-14 ENCOUNTER — Encounter: Payer: Self-pay | Admitting: Family Medicine

## 2017-02-14 ENCOUNTER — Ambulatory Visit (INDEPENDENT_AMBULATORY_CARE_PROVIDER_SITE_OTHER): Payer: 59 | Admitting: Family Medicine

## 2017-02-14 VITALS — BP 120/78 | HR 83 | Temp 98.7°F | Resp 16 | Wt 134.4 lb

## 2017-02-14 DIAGNOSIS — J3089 Other allergic rhinitis: Secondary | ICD-10-CM | POA: Diagnosis not present

## 2017-02-14 DIAGNOSIS — J4521 Mild intermittent asthma with (acute) exacerbation: Secondary | ICD-10-CM

## 2017-02-14 DIAGNOSIS — H10022 Other mucopurulent conjunctivitis, left eye: Secondary | ICD-10-CM

## 2017-02-14 DIAGNOSIS — J302 Other seasonal allergic rhinitis: Secondary | ICD-10-CM

## 2017-02-14 MED ORDER — CIPROFLOXACIN HCL 0.3 % OP SOLN: 1.0000 [drp] | OPHTHALMIC | 0 refills | Status: DC

## 2017-02-14 MED ORDER — FLUTICASONE FUROATE-VILANTEROL 100-25 MCG/INH IN AEPB: 1.0000 | INHALATION_SPRAY | Freq: Every day | RESPIRATORY_TRACT | 1 refills | Status: DC

## 2017-02-14 MED ORDER — MONTELUKAST SODIUM 10 MG PO TABS: 10.0000 mg | ORAL_TABLET | Freq: Every day | ORAL | 3 refills | Status: DC

## 2017-02-14 NOTE — Progress Notes (Signed)
Name: Helen Green   MRN: [id number]    DOB: [birth year]   Date:02/14/2017       Progress Note  Subjective  Chief Complaint  Chief Complaint  Patient presents with  . Allergies    eye redness  . Cough    SOB and chest congestion for 1 week    HPI  Allergic Rhinitis/Extrinsic Asthma: she states that her allergies have been worse for the past few weeks, with sneezing, watery eyes, itchy nose, but over the past week has noticed a dry cough, worse at night, associated with wheezing at times and SOB. Symptoms improves with inhaler. She had a similar episode with the flu in the past. No fever or chills. Feeling tired  Conjunctivitis; she woke up this am with left eye matted shut, conjunctiva is red and eye is sore.    Patient Active Problem List   Diagnosis Date Noted  . Insomnia, persistent 05/03/2015  . Functional dyspepsia 05/03/2015  . Gastro-esophageal reflux disease without esophagitis 05/03/2015  . History of anemia 05/03/2015  . Perennial allergic rhinitis with seasonal variation 05/03/2015  . 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 Vitamin D deficiency 05/03/2015  . Irritable bowel syndrome with diarrhea 05/03/2015    Past Surgical History:  Procedure Laterality Date  . ABDOMINAL HYSTERECTOMY    . TUBAL LIGATION      Family History  Problem Relation Age of Onset  . Diabetes Mother   . Hyperlipidemia Mother   . Hypertension Mother   . Liver disease Father   . Breast cancer Neg Hx     Social History   Social History  . Marital status: Married    Spouse name: N/A  . Number of children: N/A  . 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 Smoking status: Never Smoker  . Smokeless tobacco: Never Used  . Alcohol use No  . Drug use: No  . Sexual activity: Not Currently   Other Topics Concern  . Not on file   Social History Narrative   On her second marriage,  helping raise her husband's 36 yo niece.     Current Outpatient Prescriptions:  .  albuterol (PROVENTIL HFA;VENTOLIN HFA) 108 (90 Base) MCG/ACT inhaler, Inhale 2 puffs into the lungs every 6 (six) hours as needed for wheezing or shortness of breath., Disp: 1 Inhaler, Rfl: 2 .  ciprofloxacin (CILOXAN) 0.3 % ophthalmic solution, Place 1 drop into the left eye every 2 (two) hours. Administer 1 drop, every 2 hours, while awake, for 2 days. Then 1 drop, every 4 hours, while awake, for the next 5 days., Disp: 5 mL, Rfl: 0 .  fexofenadine (ALLEGRA ALLERGY) 180 MG tablet, Take 1 tablet (180 mg total) by mouth as needed., Disp: 30 tablet, Rfl: 5 .  fluticasone (FLONASE) 50 MCG/ACT nasal spray, instill 2 spray into each nostril at bedtime (Patient taking differently: instill 2 spray into each nostril at bedtime as needed), Disp: 16 g, Rfl: 2 .  fluticasone furoate-vilanterol (BREO ELLIPTA) 100-25 MCG/INH AEPB, Inhale 1 puff into the lungs daily., Disp: 60 each, Rfl: 1 .  hyoscyamine (LEVSIN, ANASPAZ) 0.125 MG tablet, take 1 tablet by mouth every 4 hours if needed, Disp: 30 tablet, Rfl: 2 .  montelukast (SINGULAIR) 10 MG tablet, Take 1 tablet (10 mg total) by mouth at bedtime., Disp: 30 tablet, Rfl: 3 .  omeprazole (PRILOSEC) 40 MG capsule,  Take 1 capsule (40 mg total) by mouth daily. (Patient not taking: Reported on 01/30/2017), Disp: 30 capsule, Rfl: 5 .  ranitidine (ZANTAC) 150 MG tablet, Take by mouth., Disp: , Rfl:   Allergies  Allergen Reactions  . Apple Hives, Shortness Of Breath and Swelling  . Daucus Carota Hives, Shortness Of Breath and Swelling  . Other Hives, Shortness Of Breath and Swelling    Pears, avacados, nuts  . Avocado     and raw fruit and vegetables     ROS  Ten systems reviewed and is negative except as mentioned in HPI   Objective  Vitals:   02/14/17 1136  BP: 120/78  Pulse: 83  Resp: 16  Temp: 98.7 F (37.1 C)  SpO2: 97%  Weight: 134 lb 7 oz (61 kg)    Body  mass index is 23.81 kg/m.  Physical Exam  Constitutional: Patient appears well-developed and well-nourished. No distress.  HEENT: head atraumatic, normocephalic, pupils equal and reactive to light, left conjunctive injected,  ears normal TM bilaterally,  neck supple, throat within normal limits Cardiovascular: Normal rate, regular rhythm and normal heart sounds.  No murmur heard. No BLE edema. Pulmonary/Chest: Effort normal and breath sounds normal. No respiratory distress. Abdominal: Soft.  Psychiatric: Patient has a normal mood and affect. behavior is normal. Judgment and thought content normal.  PHQ2/9: Depression screen [facility] 2/9 10/11/2016 08/03/2016 05/18/2016 04/19/2016 11/11/2015  Decreased Interest 0 0 0 0 0  Down, Depressed, Hopeless 0 0 0 0 0  PHQ - 2 Score 0 0 0 0 0     Fall Risk: Fall Risk  10/11/2016 08/03/2016 05/18/2016 04/19/2016 11/11/2015  Falls in the past year? No No No No No     Assessment & Plan  1. Perennial allergic rhinitis with seasonal variation  - montelukast (SINGULAIR) 10 MG tablet; Take 1 tablet (10 mg total) by mouth at bedtime.  Dispense: 30 tablet; Refill: 3  2. Asthma, extrinsic with exacerbation, mild intermittent  - fluticasone furoate-vilanterol (BREO ELLIPTA) 100-25 MCG/INH AEPB; Inhale 1 puff into the lungs daily.  Dispense: 60 each; Refill: 1 - montelukast (SINGULAIR) 10 MG tablet; Take 1 tablet (10 mg total) by mouth at bedtime.  Dispense: 30 tablet; Refill: 3  never diagnosed in the past , explained triggered by allergies, but needs to return for spirometry  3. Other mucopurulent conjunctivitis of left eye  - ciprofloxacin (CILOXAN) 0.3 % ophthalmic solution; Place 1 drop into the left eye every 2 (two) hours. Administer 1 drop, every 2 hours, while awake, for 2 days. Then 1 drop, every 4 hours, while awake, for the next 5 days.  Dispense: 5 mL; Refill: 0

## 2017-02-22 DIAGNOSIS — R109 Unspecified abdominal pain: Secondary | ICD-10-CM | POA: Diagnosis not present

## 2017-02-22 DIAGNOSIS — R140 Abdominal distension (gaseous): Secondary | ICD-10-CM | POA: Diagnosis not present

## 2017-02-27 ENCOUNTER — Telehealth: Payer: Self-pay | Admitting: Gastroenterology

## 2017-02-27 NOTE — Telephone Encounter (Signed)
Patient is waiting for results

## 2017-02-27 NOTE — Telephone Encounter (Signed)
For Dr. Vicente Males

## 2017-04-17 ENCOUNTER — Encounter: Payer: Self-pay | Admitting: Gastroenterology

## 2017-04-17 ENCOUNTER — Other Ambulatory Visit: Payer: Self-pay

## 2017-04-17 ENCOUNTER — Ambulatory Visit (INDEPENDENT_AMBULATORY_CARE_PROVIDER_SITE_OTHER): Payer: 59 | Admitting: Gastroenterology

## 2017-04-17 ENCOUNTER — Telehealth: Payer: Self-pay

## 2017-04-17 VITALS — BP 113/65 | HR 79 | Ht 63.0 in | Wt 137.0 lb

## 2017-04-17 DIAGNOSIS — R1032 Left lower quadrant pain: Secondary | ICD-10-CM

## 2017-04-17 DIAGNOSIS — K219 Gastro-esophageal reflux disease without esophagitis: Secondary | ICD-10-CM

## 2017-04-17 DIAGNOSIS — R1013 Epigastric pain: Secondary | ICD-10-CM | POA: Diagnosis not present

## 2017-04-17 MED ORDER — DICYCLOMINE HCL 10 MG PO CAPS
10.0000 mg | ORAL_CAPSULE | Freq: Three times a day (TID) | ORAL | 0 refills | Status: DC
Start: 2017-04-17 — End: 2017-07-22

## 2017-04-17 NOTE — Telephone Encounter (Signed)
Patient has been informed her CT of the Abd and Pelvis has been scheduled at [place].  On 04/23/17 at 3pm.  She has been asked to go pick up her contrast for CT before having CT.

## 2017-04-17 NOTE — Progress Notes (Signed)
Jonathon Bellows MD, MRCP(U.K) [address]  [place]  [place], [place] [address]  Main: [phone]  Fax: [phone]   Primary Care Physician: Steele Sizer, MD  Primary Gastroenterologist:  Dr. Jonathon Bellows   Chief Complaint  Patient presents with  . Abdominal Pain    follow up  . left sided abdominal pain    constant and sharp at times worse at night aggrivated by sitting (she sits on her job) getting worse  . Irritable Bowel Syndrome    follow up    HPI: Arisbel Maione is a 51 y.o. female is here today for follow up .   Summary of history :  He has been seen by [facility] back in 2015 for dyspepsia. H pylori breath test was negative. CT abdomen in 07/2015 for abdominal pain was negative.  On his initial visit in 01/2017 he complained of abdominal pain  > 15 years duration, left side of the abdomen  , on Zantac BID, bloating , abdominal distension   Interval history   01/30/2017-  04/17/2017   She underwent breath testing in 02/2017 which was negative for SIBO Did not obtain celiac serology .  H pylori breath test was negative in 2017 .  She says that she still has burping,bloating associated with abdominal discomfort, on the left side of her abdomen. , Worse when she sits, ,not worse when she eats. Feels better when she eats. Worse when she breathes hard.   She tried peppermint oil which did not work . She tried amitriptyline in the past but cant recall if it did work. She has tried Hyoscynnamine .   Current Outpatient Prescriptions  Medication Sig Dispense Refill  . albuterol (PROVENTIL HFA;VENTOLIN HFA) 108 (90 Base) MCG/ACT inhaler Inhale 2 puffs into the lungs every 6 (six) hours as needed for wheezing or shortness of breath. 1 Inhaler 2  . ciprofloxacin (CILOXAN) 0.3 % ophthalmic solution Place 1 drop into the left eye every 2 (two) hours. Administer 1 drop, every 2 hours, while awake, for 2 days. Then 1 drop, every 4 hours, while awake, for the next 5 days. 5 mL 0    . fexofenadine (ALLEGRA ALLERGY) 180 MG tablet Take 1 tablet (180 mg total) by mouth as needed. 30 tablet 5  . fluticasone (FLONASE) 50 MCG/ACT nasal spray instill 2 spray into each nostril at bedtime (Patient taking differently: instill 2 spray into each nostril at bedtime as needed) 16 g 2  . fluticasone furoate-vilanterol (BREO ELLIPTA) 100-25 MCG/INH AEPB Inhale 1 puff into the lungs daily. 60 each 1  . hyoscyamine (LEVSIN, ANASPAZ) 0.125 MG tablet take 1 tablet by mouth every 4 hours if needed 30 tablet 2  . montelukast (SINGULAIR) 10 MG tablet Take 1 tablet (10 mg total) by mouth at bedtime. 30 tablet 3  . omeprazole (PRILOSEC) 40 MG capsule Take 1 capsule (40 mg total) by mouth daily. 30 capsule 5  . ranitidine (ZANTAC) 150 MG tablet Take by mouth.     No current facility-administered medications for this visit.     Allergies as of 04/17/2017 - Review Complete 04/17/2017  Allergen Reaction Noted  . Apple Hives, Shortness Of Breath, and Swelling 05/03/2015  . Daucus carota Hives, Shortness Of Breath, and Swelling 05/03/2015  . Other Hives, Shortness Of Breath, and Swelling 05/03/2015  . Avocado  05/03/2015    ROS:  General: Negative for anorexia, weight loss, fever, chills, fatigue, weakness. ENT: Negative for hoarseness, difficulty swallowing , nasal congestion. CV:  Negative for chest pain, angina, palpitations, dyspnea on exertion, peripheral edema.  Respiratory: Negative for dyspnea at rest, dyspnea on exertion, cough, sputum, wheezing.  GI: See history of present illness. GU:  Negative for dysuria, hematuria, urinary incontinence, urinary frequency, nocturnal urination.  Endo: Negative for unusual weight change.    Physical Examination:   BP 113/65   Pulse 79   Ht 5\' 3"  (1.6 m)   Wt 137 lb (62.1 kg)   BMI 24.27 kg/m   General: Well-nourished, well-developed in no acute distress.  Eyes: No icterus. Conjunctivae pink. Mouth: Oropharyngeal mucosa moist and pink , no  lesions erythema or exudate. Lungs: Clear to auscultation bilaterally. Non-labored. Heart: Regular rate and rhythm, no murmurs rubs or gallops.  Abdomen: Bowel sounds are normal, nontender, nondistended, no hepatosplenomegaly or masses, no abdominal bruits or hernia , no rebound or guarding.   Extremities: No lower extremity edema. No clubbing or deformities. Neuro: Alert and oriented x 3.  Grossly intact. Skin: Warm and dry, no jaundice.   Psych: Alert and cooperative, normal mood and affect.   Imaging Studies: No results found.  Assessment and Plan:   Marylene Masek is a 51 y.o. y/o female here to follow up for chronic abdominal pain . Likely functional dyspepsia. No recent imaging or endosocpy .   Plan  1. EGD+colonosocpy  2. CT abdomen and pelvis with IV contrast .  3. Bentyl PRN 4. CBC,CMP 5. If above work up is negative will use pain modulators  Dr Jonathon Bellows  MD,MRCP [facility]) Follow up in 3 months.

## 2017-04-19 ENCOUNTER — Ambulatory Visit (INDEPENDENT_AMBULATORY_CARE_PROVIDER_SITE_OTHER): Payer: 59 | Admitting: Family Medicine

## 2017-04-19 ENCOUNTER — Encounter: Payer: Self-pay | Admitting: Family Medicine

## 2017-04-19 VITALS — BP 110/64 | HR 71 | Temp 98.1°F | Resp 18 | Ht 63.0 in | Wt 134.5 lb

## 2017-04-19 DIAGNOSIS — K580 Irritable bowel syndrome with diarrhea: Secondary | ICD-10-CM

## 2017-04-19 DIAGNOSIS — R1013 Epigastric pain: Secondary | ICD-10-CM | POA: Diagnosis not present

## 2017-04-19 DIAGNOSIS — G4489 Other headache syndrome: Secondary | ICD-10-CM | POA: Diagnosis not present

## 2017-04-19 DIAGNOSIS — J4520 Mild intermittent asthma, uncomplicated: Secondary | ICD-10-CM | POA: Diagnosis not present

## 2017-04-19 MED ORDER — BACLOFEN 20 MG PO TABS: 20.0000 mg | ORAL_TABLET | Freq: Three times a day (TID) | ORAL | 0 refills | Status: DC

## 2017-04-19 NOTE — Progress Notes (Signed)
Name: Helen Green   MRN: [id number]    DOB: 12/08/1965   Date:04/19/2017       Progress Note  Subjective  Chief Complaint  Chief Complaint  Patient presents with  . Asthma  . Irritable Bowel Syndrome  . Headache    pt stated that she has had a severe headache for last 4 days    HPI    Allergic Rhinitis/Extrinsic Asthma: she states symptoms usually triggered by allergies and URI, currently no wheezing or SOB, she still has a dry cough ( usually triggered by strong fumes). Not currently on medication, uses Ventolin prn   IBS : she has a long history of abdominal pain, treated h. Pylori years ago. She is having 2 bowel movements per day.  She has bloating, burping, pain is described as dull or burning at times sharp, it is worse at night, but also present during the day, she is concerned because pain is more on the left flank now but also epigastric. She denies hematuria, dysuria or urinary frequency. Wakes up at night with pain, but no bowel movements during the night. No fever or chills. No change in appetite, weight is stable. She did not noticed improvement with Effexor and stopped medication, but pain seems to have improved when she was taking Viberzie, however stopped because of constipation even with lower dose. She was recently seen by Dr. Mortimer Fries, that prescribed Bentyl and IBgard, but did not pick up rx yet ( saw him yesterday)   Headache: she is very worried about her brother, she was just in [place] visiting him, and he had two car accidents, worse headaches, went [facility] and found out he had a brain tumor, had a biopsy done, she is constantly worried about him, feels tension on her neck, constant dull ache that is worse on nuchal area, but goes all over her head, no nausea, vomiting or neuro deficits.   Patient Active Problem List   Diagnosis Date Noted  . Insomnia, persistent 05/03/2015  . Functional dyspepsia 05/03/2015  . Gastro-esophageal reflux disease without esophagitis 05/03/2015   . History of anemia 05/03/2015  . Perennial allergic rhinitis with seasonal variation 05/03/2015  . 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 Vitamin D deficiency 05/03/2015  . Irritable bowel syndrome with diarrhea 05/03/2015    Past Surgical History:  Procedure Laterality Date  . ABDOMINAL HYSTERECTOMY    . TUBAL LIGATION      Family History  Problem Relation Age of Onset  . Diabetes Mother   . Hyperlipidemia Mother   . Hypertension Mother   . Liver disease Father   . Breast cancer Neg Hx     Social History   Social History  . Marital status: Married    Spouse name: N/A  . Number of children: N/A  . 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 Smoking status: Never Smoker  . Smokeless tobacco: Never Used  . Alcohol use No  . Drug use: No  . Sexual activity: Not Currently   Other Topics Concern  . Not on file   Social History Narrative   On her second marriage, helping raise her husband's 73 yo niece.     Current Outpatient Prescriptions:  .  albuterol (PROVENTIL HFA;VENTOLIN HFA) 108 (90 Base) MCG/ACT inhaler, Inhale 2 puffs into the lungs every 6 (six) hours as needed for wheezing or shortness of breath.,  Disp: 1 Inhaler, Rfl: 2 .  dicyclomine (BENTYL) 10 MG capsule, Take 1 capsule (10 mg total) by mouth 4 (four) times daily -  before meals and at bedtime., Disp: 90 capsule, Rfl: 0 .  fexofenadine (ALLEGRA ALLERGY) 180 MG tablet, Take 1 tablet (180 mg total) by mouth as needed., Disp: 30 tablet, Rfl: 5 .  fluticasone (FLONASE) 50 MCG/ACT nasal spray, instill 2 spray into each nostril at bedtime (Patient taking differently: instill 2 spray into each nostril at bedtime as needed), Disp: 16 g, Rfl: 2 .  fluticasone furoate-vilanterol (BREO ELLIPTA) 100-25 MCG/INH AEPB, Inhale 1 puff into the lungs daily., Disp: 60 each, Rfl: 1 .  hyoscyamine (LEVSIN, ANASPAZ) 0.125 MG  tablet, take 1 tablet by mouth every 4 hours if needed, Disp: 30 tablet, Rfl: 2 .  montelukast (SINGULAIR) 10 MG tablet, Take 1 tablet (10 mg total) by mouth at bedtime., Disp: 30 tablet, Rfl: 3 .  omeprazole (PRILOSEC) 40 MG capsule, Take 1 capsule (40 mg total) by mouth daily., Disp: 30 capsule, Rfl: 5 .  ranitidine (ZANTAC) 150 MG tablet, Take by mouth., Disp: , Rfl:   Allergies  Allergen Reactions  . Apple Hives, Shortness Of Breath and Swelling  . Daucus Carota Hives, Shortness Of Breath and Swelling  . Other Hives, Shortness Of Breath and Swelling    Pears, avacados, nuts  . Avocado     and raw fruit and vegetables     ROS  Constitutional: Negative for fever or weight change.  Respiratory: Negative for cough and shortness of breath.   Cardiovascular: Negative for chest pain or palpitations.  Gastrointestinal: Negative for abdominal pain, no bowel changes.  Musculoskeletal: Negative for gait problem or joint swelling.  Skin: Negative for rash.  Neurological: Negative for dizziness , positive for  headache.  No other specific complaints in a complete review of systems (except as listed in HPI above).  Objective  Vitals:   04/19/17 0810  BP: 110/64  Pulse: 71  Resp: 18  Temp: 98.1 F (36.7 C)  SpO2: 98%  Weight: 134 lb 8 oz (61 kg)  Height: 5\' 3"  (1.6 m)    Body mass index is 23.83 kg/m.  Physical Exam  Constitutional: Patient appears well-developed and well-nourished.  No distress.  HEENT: head atraumatic, normocephalic, pupils equal and reactive to light, neck supple, throat within normal limits Cardiovascular: Normal rate, regular rhythm and normal heart sounds.  No murmur heard. No BLE edema. Pulmonary/Chest: Effort normal and breath sounds normal. No respiratory distress. Abdominal: Soft.  There is no tenderness. Psychiatric: Patient has a normal mood and affect. behavior is normal. Judgment and thought content normal. Neurological: Romberg negative,  cranial nerves intact, normal gait, no focal findings.   PHQ2/9: Depression screen [facility] 2/9 10/11/2016 08/03/2016 05/18/2016 04/19/2016 11/11/2015  Decreased Interest 0 0 0 0 0  Down, Depressed, Hopeless 0 0 0 0 0  PHQ - 2 Score 0 0 0 0 0     Fall Risk: Fall Risk  10/11/2016 08/03/2016 05/18/2016 04/19/2016 11/11/2015  Falls in the past year? No No No No No     Assessment & Plan  1. Irritable bowel syndrome with diarrhea  Seen by Dr. Mortimer Fries yesterday, unable to tolerate Vyberzi it caused constipation, was given Bentyl and a supplement called IBgard that she will start today..   2. Mild intermittent asthma without complication  Spirometry normal today, states episodes only with allergies and when she has a cold  3. Other headache syndrome  Brother recently diagnosed with brain tumor, she is very worried about it, she is not worried about her having a tumor, just very worried about him. Advised massage therapy and muscle relaxer.   - baclofen (LIORESAL) 20 MG tablet; Take 1 tablet (20 mg total) by mouth 3 (three) times daily.  Dispense: 30 each; Refill: 0

## 2017-04-20 LAB — CBC WITH DIFFERENTIAL/PLATELET
BASOS: 1 %
Basophils Absolute: 0.1 10*3/uL (ref 0.0–0.2)
EOS (ABSOLUTE): 0.5 10*3/uL — ABNORMAL HIGH (ref 0.0–0.4)
Eos: 7 %
HEMATOCRIT: 42.7 % (ref 34.0–46.6)
HEMOGLOBIN: 14.3 g/dL (ref 11.1–15.9)
IMMATURE GRANS (ABS): 0 10*3/uL (ref 0.0–0.1)
Immature Granulocytes: 0 %
LYMPHS: 36 %
Lymphocytes Absolute: 2.7 10*3/uL (ref 0.7–3.1)
MCH: 29.1 pg (ref 26.6–33.0)
MCHC: 33.5 g/dL (ref 31.5–35.7)
MCV: 87 fL (ref 79–97)
MONOCYTES: 5 %
Monocytes Absolute: 0.4 10*3/uL (ref 0.1–0.9)
NEUTROS ABS: 3.8 10*3/uL (ref 1.4–7.0)
Neutrophils: 51 %
Platelets: 263 10*3/uL (ref 150–379)
RBC: 4.92 x10E6/uL (ref 3.77–5.28)
RDW: 14.1 % (ref 12.3–15.4)
WBC: 7.6 10*3/uL (ref 3.4–10.8)

## 2017-04-20 LAB — COMPREHENSIVE METABOLIC PANEL
A/G RATIO: 1.8 (ref 1.2–2.2)
ALBUMIN: 4.6 g/dL (ref 3.5–5.5)
ALT: 16 IU/L (ref 0–32)
AST: 18 IU/L (ref 0–40)
Alkaline Phosphatase: 60 IU/L (ref 39–117)
BUN/Creatinine Ratio: 15 (ref 9–23)
BUN: 11 mg/dL (ref 6–24)
Bilirubin Total: 0.8 mg/dL (ref 0.0–1.2)
CALCIUM: 9.9 mg/dL (ref 8.7–10.2)
CO2: 25 mmol/L (ref 20–29)
Chloride: 102 mmol/L (ref 96–106)
Creatinine, Ser: 0.73 mg/dL (ref 0.57–1.00)
GFR, EST AFRICAN AMERICAN: 113 mL/min/{1.73_m2} (ref >59)
GFR, EST NON AFRICAN AMERICAN: 98 mL/min/{1.73_m2} (ref >59)
GLUCOSE: 87 mg/dL (ref 65–99)
Globulin, Total: 2.5 g/dL (ref 1.5–4.5)
Potassium: 4.2 mmol/L (ref 3.5–5.2)
Sodium: 143 mmol/L (ref 134–144)
TOTAL PROTEIN: 7.1 g/dL (ref 6.0–8.5)

## 2017-04-23 ENCOUNTER — Telehealth: Payer: Self-pay

## 2017-04-23 ENCOUNTER — Encounter: Payer: Self-pay | Admitting: Family Medicine

## 2017-04-23 ENCOUNTER — Ambulatory Visit: Admission: RE | Admit: 2017-04-23 | Payer: 59 | Source: Ambulatory Visit

## 2017-04-23 NOTE — Telephone Encounter (Signed)
-----   Message from Jonathon Bellows, MD sent at 04/21/2017 12:27 PM EDT ----- CBC,CMP-normal

## 2017-04-23 NOTE — Telephone Encounter (Signed)
Advised patient of results per Dr. Vicente Males.   CBC,CMP-normal

## 2017-04-29 ENCOUNTER — Encounter: Payer: Self-pay | Admitting: *Deleted

## 2017-05-01 ENCOUNTER — Ambulatory Visit: Admission: RE | Admit: 2017-05-01 | Payer: 59 | Source: Ambulatory Visit | Admitting: Gastroenterology

## 2017-05-01 ENCOUNTER — Encounter: Admission: RE | Payer: Self-pay | Source: Ambulatory Visit

## 2017-05-01 SURGERY — COLONOSCOPY WITH PROPOFOL
Anesthesia: Choice

## 2017-05-02 ENCOUNTER — Ambulatory Visit: Payer: 59 | Admitting: General Surgery

## 2017-05-09 ENCOUNTER — Encounter: Payer: Self-pay | Admitting: Neurology

## 2017-05-09 ENCOUNTER — Ambulatory Visit (INDEPENDENT_AMBULATORY_CARE_PROVIDER_SITE_OTHER): Payer: 59 | Admitting: Family Medicine

## 2017-05-09 ENCOUNTER — Encounter: Payer: Self-pay | Admitting: Family Medicine

## 2017-05-09 VITALS — BP 106/68 | HR 83 | Temp 97.8°F | Resp 18 | Ht 63.0 in | Wt 137.5 lb

## 2017-05-09 DIAGNOSIS — R29898 Other symptoms and signs involving the musculoskeletal system: Secondary | ICD-10-CM | POA: Diagnosis not present

## 2017-05-09 DIAGNOSIS — G4489 Other headache syndrome: Secondary | ICD-10-CM

## 2017-05-09 MED ORDER — NORTRIPTYLINE HCL 10 MG PO CAPS: 10.0000 mg | ORAL_CAPSULE | Freq: Every day | ORAL | 0 refills | Status: DC

## 2017-05-09 NOTE — Progress Notes (Signed)
Name: Helen Green   MRN: [id number]    DOB: 08-09-1966   Date:05/09/2017       Progress Note  Subjective  Chief Complaint  Chief Complaint  Patient presents with  . Referral    pt stated that her haedaches have gotten worse up to 3 times a day pt also stated that she can hear shattering glass    HPI  Headaches :she had a CT head done back in 2016 because of headache and left arm pain and it was negative, never diagnosed with migraine in the past, however her 49 yo brother was diagnosed with brain tumor less than one month ago. She started to have headaches around the same time. She states headache daily, sometimes multiple times a day, it is describe as tight and pressure, different parts of her brain, but usually worse on left temporal area. Episodes lasts hours, the intense pain improves with otc medication, but she has a constant pressure. At times associated with nausea, has noticed left arm tingling and weakness. She states the arm symptoms are worse when she is stressed. It does not wake her up at night. No double vision.    Patient Active Problem List   Diagnosis Date Noted  . Insomnia, persistent 05/03/2015  . Functional dyspepsia 05/03/2015  . Gastro-esophageal reflux disease without esophagitis 05/03/2015  . History of anemia 05/03/2015  . Perennial allergic rhinitis with seasonal variation 05/03/2015  . 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 Vitamin D deficiency 05/03/2015  . Irritable bowel syndrome with diarrhea 05/03/2015    Past Surgical History:  Procedure Laterality Date  . ABDOMINAL HYSTERECTOMY    . TUBAL LIGATION      Family History  Problem Relation Age of Onset  . Diabetes Mother   . Hyperlipidemia Mother   . Hypertension Mother   . Liver disease Father   . Breast cancer Neg Hx     Social History   Social History  . Marital status: Married    Spouse name: N/A  . Number of children: N/A  . Years of education: N/A   Occupational  History  . 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 Smoking status: Never Smoker  . Smokeless tobacco: Never Used  . Alcohol use No  . Drug use: No  . Sexual activity: Not Currently   Other Topics Concern  . Not on file   Social History Narrative   On her second marriage, helping raise her husband's 68 yo niece.     Current Outpatient Prescriptions:  .  albuterol (PROVENTIL HFA;VENTOLIN HFA) 108 (90 Base) MCG/ACT inhaler, Inhale 2 puffs into the lungs every 6 (six) hours as needed for wheezing or shortness of breath., Disp: 1 Inhaler, Rfl: 2 .  baclofen (LIORESAL) 20 MG tablet, Take 1 tablet (20 mg total) by mouth 3 (three) times daily., Disp: 30 each, Rfl: 0 .  dicyclomine (BENTYL) 10 MG capsule, Take 1 capsule (10 mg total) by mouth 4 (four) times daily -  before meals and at bedtime., Disp: 90 capsule, Rfl: 0 .  fexofenadine (ALLEGRA ALLERGY) 180 MG tablet, Take 1 tablet (180 mg total) by mouth as needed., Disp: 30 tablet, Rfl: 5 .  fluticasone (FLONASE) 50 MCG/ACT nasal spray, instill 2 spray into each nostril at bedtime (Patient taking differently: instill 2 spray into each nostril at bedtime as needed), Disp: 16 g, Rfl: 2 .  fluticasone furoate-vilanterol (BREO ELLIPTA) 100-25 MCG/INH AEPB, Inhale  1 puff into the lungs daily., Disp: 60 each, Rfl: 1 .  hyoscyamine (LEVSIN, ANASPAZ) 0.125 MG tablet, take 1 tablet by mouth every 4 hours if needed, Disp: 30 tablet, Rfl: 2 .  montelukast (SINGULAIR) 10 MG tablet, Take 1 tablet (10 mg total) by mouth at bedtime., Disp: 30 tablet, Rfl: 3 .  omeprazole (PRILOSEC) 40 MG capsule, Take 1 capsule (40 mg total) by mouth daily., Disp: 30 capsule, Rfl: 5 .  ranitidine (ZANTAC) 150 MG tablet, Take by mouth., Disp: , Rfl:   Allergies  Allergen Reactions  . Apple Hives, Shortness Of Breath and Swelling  . Daucus Carota Hives, Shortness Of Breath and Swelling  . Other Hives, Shortness Of Breath and Swelling     Pears, avacados, nuts  . Avocado     and raw fruit and vegetables     ROS  Ten systems reviewed and is negative except as mentioned in HPI   Objective  Vitals:   05/09/17 0843  BP: 106/68  Pulse: 83  Resp: 18  Temp: 97.8 F (36.6 C)  SpO2: 96%  Weight: 137 lb 8 oz (62.4 kg)  Height: 5\' 3"  (1.6 m)    Body mass index is 24.36 kg/m.  Physical Exam  Constitutional: Patient appears well-developed and well-nourished. No distress.  HEENT: head atraumatic, normocephalic, pupils equal and reactive to light, neck supple, throat within normal limits Cardiovascular: Normal rate, regular rhythm and normal heart sounds.  No murmur heard. No BLE edema. Pulmonary/Chest: Effort normal and breath sounds normal. No respiratory distress. Abdominal: Soft.  There is no tenderness. Psychiatric: Patient has a normal mood and affect. behavior is normal. Judgment and thought content normal. She seems anxious Neurological: normal grip of both sides, but left a little weaker than right side, romberg negative, normal gait, normal cranial nerves, normal sensation   Recent Results (from the past 2160 hour(s))  Comprehensive Metabolic Panel (CMET)     Status: None   Collection Time: 04/19/17  9:34 AM  Result Value Ref Range   Glucose 87 65 - 99 mg/dL   BUN 11 6 - 24 mg/dL   Creatinine, Ser 0.73 0.57 - 1.00 mg/dL   GFR calc non Af Amer 98 >59 mL/min/1.73   GFR calc Af Amer 113 >59 mL/min/1.73   BUN/Creatinine Ratio 15 9 - 23   Sodium 143 134 - 144 mmol/L   Potassium 4.2 3.5 - 5.2 mmol/L   Chloride 102 96 - 106 mmol/L   CO2 25 20 - 29 mmol/L    Comment:               **Please note reference interval change**   Calcium 9.9 8.7 - 10.2 mg/dL   Total Protein 7.1 6.0 - 8.5 g/dL   Albumin 4.6 3.5 - 5.5 g/dL   Globulin, Total 2.5 1.5 - 4.5 g/dL   Albumin/Globulin Ratio 1.8 1.2 - 2.2   Bilirubin Total 0.8 0.0 - 1.2 mg/dL   Alkaline Phosphatase 60 39 - 117 IU/L   AST 18 0 - 40 IU/L   ALT 16 0 - 32  IU/L  CBC with Differential/Platelet     Status: Abnormal   Collection Time: 04/19/17  9:34 AM  Result Value Ref Range   WBC 7.6 3.4 - 10.8 x10E3/uL   RBC 4.92 3.77 - 5.28 x10E6/uL   Hemoglobin 14.3 11.1 - 15.9 g/dL   Hematocrit 42.7 34.0 - 46.6 %   MCV 87 79 - 97 fL   MCH 29.1 26.6 -  33.0 pg   MCHC 33.5 31.5 - 35.7 g/dL   RDW 14.1 12.3 - 15.4 %   Platelets 263 150 - 379 x10E3/uL   Neutrophils 51 Not Estab. %   Lymphs 36 Not Estab. %   Monocytes 5 Not Estab. %   Eos 7 Not Estab. %   Basos 1 Not Estab. %   Neutrophils Absolute 3.8 1.4 - 7.0 x10E3/uL   Lymphocytes Absolute 2.7 0.7 - 3.1 x10E3/uL   Monocytes Absolute 0.4 0.1 - 0.9 x10E3/uL   EOS (ABSOLUTE) 0.5 (H) 0.0 - 0.4 x10E3/uL   Basophils Absolute 0.1 0.0 - 0.2 x10E3/uL   Immature Granulocytes 0 Not Estab. %   Immature Grans (Abs) 0.0 0.0 - 0.1 x10E3/uL      PHQ2/9: Depression screen [facility] 2/9 10/11/2016 08/03/2016 05/18/2016 04/19/2016 11/11/2015  Decreased Interest 0 0 0 0 0  Down, Depressed, Hopeless 0 0 0 0 0  PHQ - 2 Score 0 0 0 0 0     Fall Risk: Fall Risk  10/11/2016 08/03/2016 05/18/2016 04/19/2016 11/11/2015  Falls in the past year? No No No No No     Assessment & Plan  1. Other headache syndrome  - Ambulatory referral to Neurology - CT Head Wo Contrast; Future - nortriptyline (PAMELOR) 10 MG capsule; Take 1-2 capsules (10-20 mg total) by mouth at bedtime.  Dispense: 60 capsule; Refill: 0  2. Left arm weakness  - Ambulatory referral to Neurology - CT Head Wo Contrast; Future

## 2017-05-17 ENCOUNTER — Ambulatory Visit
Admission: RE | Admit: 2017-05-17 | Discharge: 2017-05-17 | Disposition: A | Discharge status: Home / Self Care | Payer: 59 | Source: Ambulatory Visit | Attending: Gastroenterology | Admitting: Gastroenterology

## 2017-05-17 DIAGNOSIS — R1032 Left lower quadrant pain: Secondary | ICD-10-CM | POA: Diagnosis not present

## 2017-05-17 DIAGNOSIS — R1013 Epigastric pain: Secondary | ICD-10-CM

## 2017-05-17 DIAGNOSIS — Z90710 Acquired absence of both cervix and uterus: Secondary | ICD-10-CM | POA: Insufficient documentation

## 2017-05-17 DIAGNOSIS — R29898 Other symptoms and signs involving the musculoskeletal system: Secondary | ICD-10-CM

## 2017-05-17 DIAGNOSIS — R109 Unspecified abdominal pain: Secondary | ICD-10-CM | POA: Diagnosis not present

## 2017-05-17 DIAGNOSIS — R51 Headache: Secondary | ICD-10-CM | POA: Diagnosis not present

## 2017-05-17 DIAGNOSIS — G4489 Other headache syndrome: Secondary | ICD-10-CM

## 2017-05-17 MED ORDER — IOPAMIDOL (ISOVUE-300) INJECTION 61%
100.0000 mL | Freq: Once | INTRAVENOUS | Status: AC | PRN
  Administered 2017-05-17: 100 mL via INTRAVENOUS

## 2017-05-21 ENCOUNTER — Telehealth: Payer: Self-pay

## 2017-05-21 NOTE — Telephone Encounter (Signed)
Informed patient of results per Dr. Vicente Males.   - CT abdomen was normal

## 2017-05-21 NOTE — Telephone Encounter (Signed)
-----   Message from Helen Bellows, MD sent at 05/19/2017  5:15 PM EDT ----- Dahlia Byes inform CT abdomen was normal

## 2017-07-01 IMAGING — CR DG MANDIBLE 4+V
1 series · 5 of 5 positions shown · non-contrast
Comparison: None.

CLINICAL DATA: Left are pain for 2 months.  No injury.

EXAM:
MANDIBLE - 4+ VIEW

[Series 1: dg mandible 4 views · 0.14mm/px · 5 of 5 slices shown]
[im 1/5]
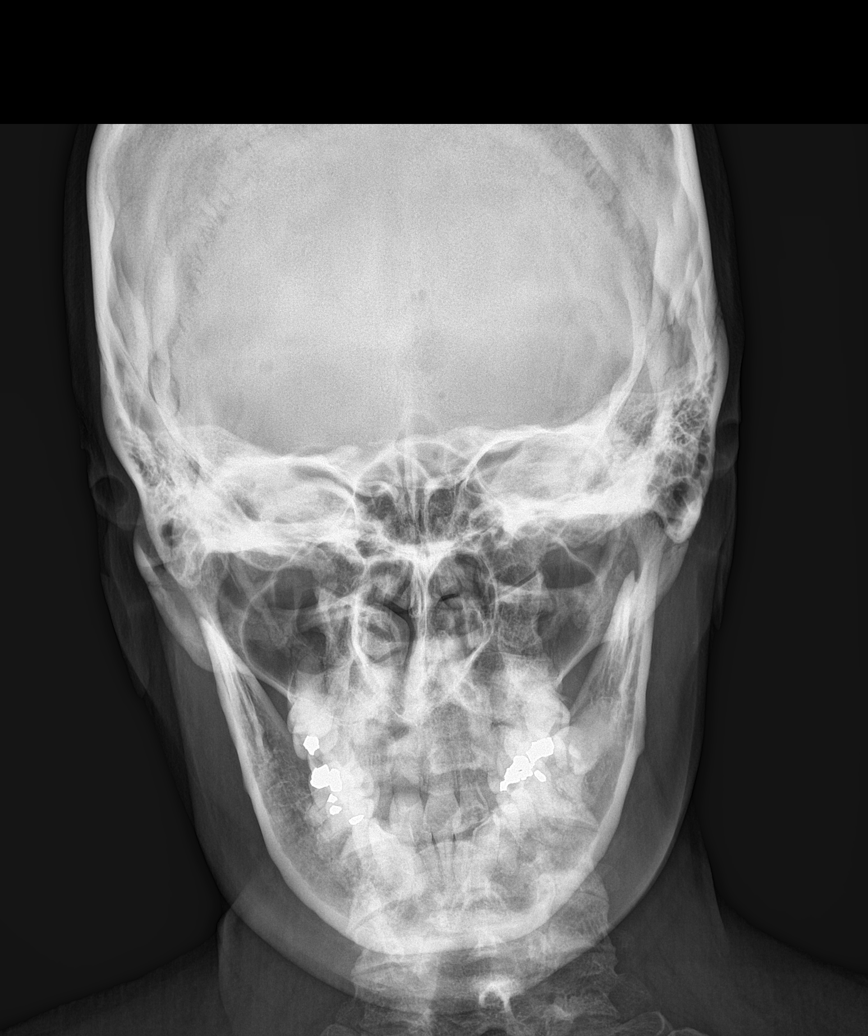
[im 2/5]
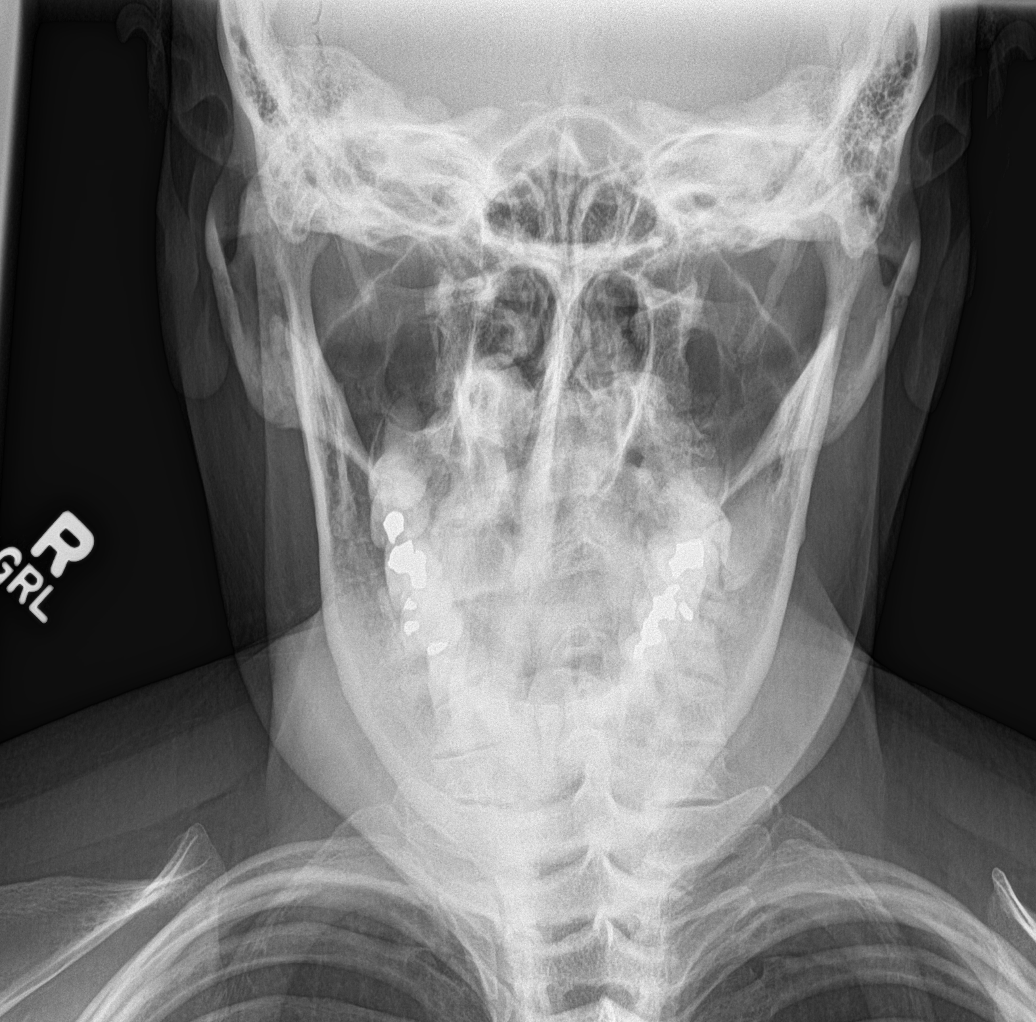
[im 3/5]
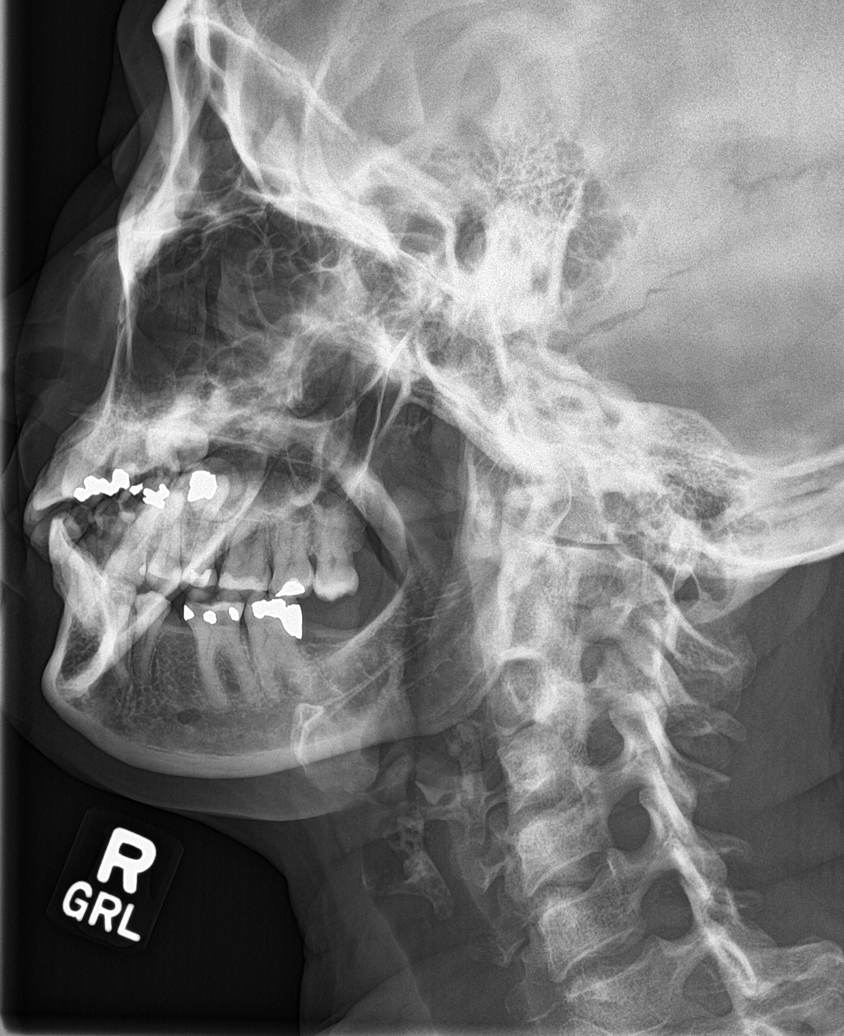
[im 4/5]
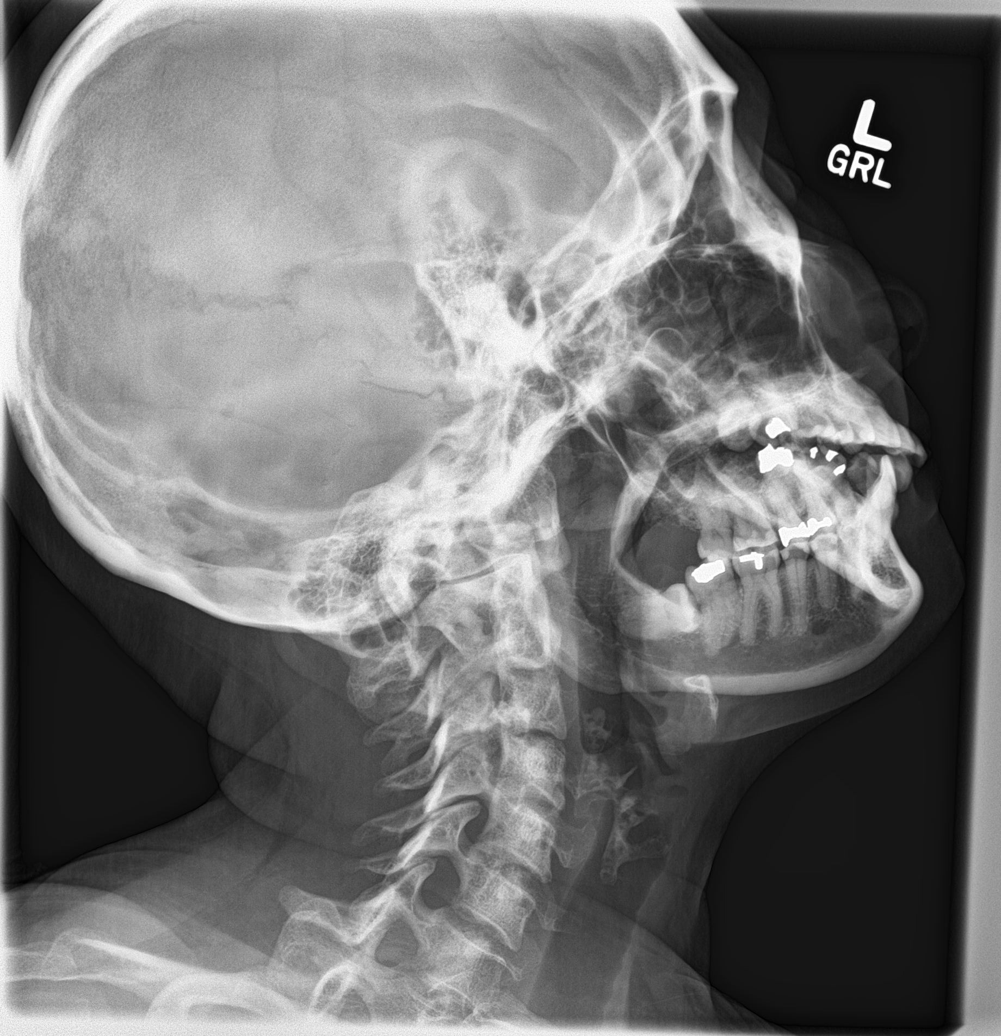
[im 5/5]
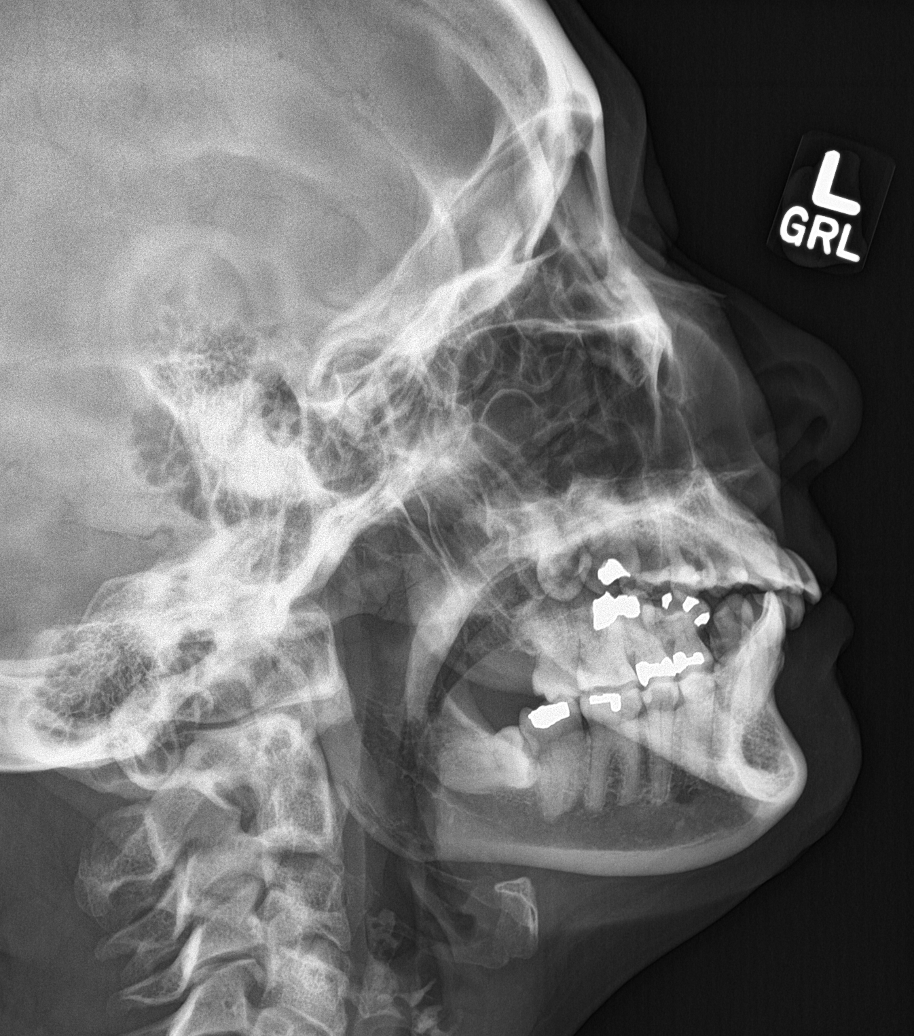

[5 of 5 positions shown; findings below may reference images not displayed]

FINDINGS: There is no evidence of fracture or other focal bone lesions.
IMPRESSION: No acute fracture or dislocation.

## 2017-07-22 ENCOUNTER — Ambulatory Visit (INDEPENDENT_AMBULATORY_CARE_PROVIDER_SITE_OTHER): Payer: 59 | Admitting: Family Medicine

## 2017-07-22 ENCOUNTER — Encounter: Payer: Self-pay | Admitting: Family Medicine

## 2017-07-22 VITALS — BP 110/62 | HR 88 | Temp 98.5°F | Resp 16 | Ht 63.0 in | Wt 135.2 lb

## 2017-07-22 DIAGNOSIS — R1012 Left upper quadrant pain: Secondary | ICD-10-CM

## 2017-07-22 DIAGNOSIS — Z23 Encounter for immunization: Secondary | ICD-10-CM | POA: Diagnosis not present

## 2017-07-22 DIAGNOSIS — Z1159 Encounter for screening for other viral diseases: Secondary | ICD-10-CM | POA: Diagnosis not present

## 2017-07-22 DIAGNOSIS — K219 Gastro-esophageal reflux disease without esophagitis: Secondary | ICD-10-CM

## 2017-07-22 DIAGNOSIS — J4520 Mild intermittent asthma, uncomplicated: Secondary | ICD-10-CM

## 2017-07-22 DIAGNOSIS — K580 Irritable bowel syndrome with diarrhea: Secondary | ICD-10-CM

## 2017-07-22 MED ORDER — DICYCLOMINE HCL 10 MG PO CAPS: 10.0000 mg | ORAL_CAPSULE | Freq: Three times a day (TID) | ORAL | 0 refills | Status: DC

## 2017-07-22 MED ORDER — HYOSCYAMINE SULFATE 0.125 MG PO TABS
ORAL_TABLET | ORAL | 0 refills | Status: DC
Start: 2017-07-22 — End: 2017-12-21

## 2017-07-22 NOTE — Progress Notes (Signed)
Name: Helen Green   MRN: [id number]    DOB: 1966-06-09   Date:07/22/2017       Progress Note  Subjective  Chief Complaint  Chief Complaint  Patient presents with  . Medication Refill    3 month F/U  . Irritable Bowel Syndrome    Needs Refill on Levsin  . Allergic Rhinitis     Well controlled  . Headache    Stopped Nortripytline due to being able to control headaches with Ibuprofen and massages    HPI  Allergic Rhinitis/Extrinsic Asthma: she states symptoms usually triggered by allergies and URI, currently no wheezing or SOB, she still has a dry cough ( usually triggered by strong fumes). Not currently on medication, uses Ventolin prn , she stopped singulair and Breo since last visit   IBS : she has a long history of abdominal pain, treated h. Pylori years ago. She is having 2 bowel movements per day. She has bloating, burping, pain is described as dull or burning at times sharp, it is worse at night, but also present during the day, she is concerned because pain is more on the left flank now but also epigastric. She denies hematuria, dysuria or urinary frequency. Wakes up at night with pain, but no bowel movements during the night. No fever or chills. No change in appetite, weight is stable. She did not noticed improvement with Effexor and stopped medication, but pain seems to have improved when she was taking Viberzie, however stopped because of constipation even with lower dose. She was recently seen by Dr. Mortimer Fries, that prescribed Bentyl and IBgard, she was supposed to have Celiac disease but it was not done, we will check it today. Also advised to try resuming Nortriptyline that was given for headache initially because it can improve IBS symptoms.   Headache: negative CT, headache has improved, worried about brother, actively dying from brain cancer  Patient Active Problem List   Diagnosis Date Noted  . Insomnia, persistent 05/03/2015  . Functional dyspepsia 05/03/2015  .  Gastro-esophageal reflux disease without esophagitis 05/03/2015  . History of anemia 05/03/2015  . Perennial allergic rhinitis with seasonal variation 05/03/2015  . 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 Vitamin D deficiency 05/03/2015  . Irritable bowel syndrome with diarrhea 05/03/2015    Past Surgical History:  Procedure Laterality Date  . ABDOMINAL HYSTERECTOMY    . TUBAL LIGATION      Family History  Problem Relation Age of Onset  . Diabetes Mother   . Hyperlipidemia Mother   . Hypertension Mother   . Liver disease Father   . Cancer Brother 71       brain cancer  . Breast cancer Neg Hx     Social History   Social History  . Marital status: Married    Spouse name: N/A  . Number of children: N/A  . 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 Smoking status: Never Smoker  . Smokeless tobacco: Never Used  . Alcohol use No  . Drug use: No  . Sexual activity: Yes   Other Topics Concern  . Not on file   Social History Narrative   On her second marriage, helping raise her husband's 81 yo niece.     Current Outpatient Prescriptions:  .  dicyclomine (BENTYL) 10 MG capsule, Take 1 capsule (10 mg total) by mouth 4 (four) times daily -  before meals and at bedtime., Disp: 90 capsule, Rfl: 0 .  fexofenadine (ALLEGRA ALLERGY) 180 MG tablet, Take 1 tablet (180 mg total) by mouth as needed., Disp: 30 tablet, Rfl: 5 .  fluticasone (FLONASE) 50 MCG/ACT nasal spray, instill 2 spray into each nostril at bedtime (Patient taking differently: instill 2 spray into each nostril at bedtime as needed), Disp: 16 g, Rfl: 2 .  hyoscyamine (LEVSIN, ANASPAZ) 0.125 MG tablet, take 1 tablet by mouth every 4 hours if needed, Disp: 90 tablet, Rfl: 0 .  omeprazole (PRILOSEC) 40 MG capsule, Take 1 capsule (40 mg total) by mouth daily., Disp: 30 capsule, Rfl: 5 .  ranitidine (ZANTAC) 150 MG tablet, Take  by mouth., Disp: , Rfl:  .  albuterol (PROVENTIL HFA;VENTOLIN HFA) 108 (90 Base) MCG/ACT inhaler, Inhale 2 puffs into the lungs every 6 (six) hours as needed for wheezing or shortness of breath. (Patient not taking: Reported on 07/22/2017), Disp: 1 Inhaler, Rfl: 2 .  baclofen (LIORESAL) 20 MG tablet, Take 1 tablet (20 mg total) by mouth 3 (three) times daily. (Patient not taking: Reported on 07/22/2017), Disp: 30 each, Rfl: 0 .  fluticasone furoate-vilanterol (BREO ELLIPTA) 100-25 MCG/INH AEPB, Inhale 1 puff into the lungs daily. (Patient not taking: Reported on 07/22/2017), Disp: 60 each, Rfl: 1 .  montelukast (SINGULAIR) 10 MG tablet, Take 1 tablet (10 mg total) by mouth at bedtime. (Patient not taking: Reported on 07/22/2017), Disp: 30 tablet, Rfl: 3 .  nortriptyline (PAMELOR) 10 MG capsule, Take 1-2 capsules (10-20 mg total) by mouth at bedtime. (Patient not taking: Reported on 07/22/2017), Disp: 60 capsule, Rfl: 0  Allergies  Allergen Reactions  . Apple Hives, Shortness Of Breath and Swelling  . Daucus Carota Hives, Shortness Of Breath and Swelling  . Other Hives, Shortness Of Breath and Swelling    Pears, avacados, nuts  . Avocado     and raw fruit and vegetables     ROS  Constitutional: Negative for fever or weight change.  Respiratory: Negative for cough and shortness of breath.   Cardiovascular: Negative for chest pain or palpitations.  Gastrointestinal: Positive for abdominal pain, no  bowel changes.  Musculoskeletal: Negative for gait problem or joint swelling.  Skin: Negative for rash.  Neurological: Negative for dizziness or headache.  No other specific complaints in a complete review of systems (except as listed in HPI above).  Objective  Vitals:   07/22/17 0917  BP: 110/62  Pulse: 88  Resp: 16  Temp: 98.5 F (36.9 C)  TempSrc: Oral  SpO2: 98%  Weight: 135 lb 3.2 oz (61.3 kg)  Height: 5\' 3"  (1.6 m)    Body mass index is 23.95 kg/m.  Physical  Exam  Constitutional: Patient appears well-developed and well-nourished.  No distress.  HEENT: head atraumatic, normocephalic, pupils equal and reactive to light,  neck supple, throat within normal limits Cardiovascular: Normal rate, regular rhythm and normal heart sounds.  No murmur heard. No BLE edema. Pulmonary/Chest: Effort normal and breath sounds normal. No respiratory distress. Abdominal: Soft.  There is no tenderness. Psychiatric: Patient has a normal mood and affect. behavior is normal. Judgment and thought content normal. Neurological exam: no focal findings.   PHQ2/9: Depression screen [facility] 2/9 07/22/2017 10/11/2016 08/03/2016 05/18/2016 04/19/2016  Decreased Interest 0 0 0 0 0  Down, Depressed, Hopeless 0 0 0 0 0  PHQ - 2 Score 0 0 0 0 0    Fall Risk: Fall Risk  07/22/2017 10/11/2016 08/03/2016 05/18/2016 04/19/2016  Falls in the past year? No No No No No     Functional Status Survey: Is the patient deaf or have difficulty hearing?: No Does the patient have difficulty seeing, even when wearing glasses/contacts?: No Does the patient have difficulty concentrating, remembering, or making decisions?: No Does the patient have difficulty walking or climbing stairs?: No Does the patient have difficulty dressing or bathing?: No Does the patient have difficulty doing errands alone such as visiting a doctor's office or shopping?: No    Assessment & Plan  1. Irritable bowel syndrome with diarrhea  Seen by Dr. Vicente Males, but did not have labs done, we will check it for Dr. Vicente Males - dicyclomine (BENTYL) 10 MG capsule; Take 1 capsule (10 mg total) by mouth 4 (four) times daily -  before meals and at bedtime.  Dispense: 90 capsule; Refill: 0 - hyoscyamine (LEVSIN, ANASPAZ) 0.125 MG tablet; take 1 tablet by mouth every 4 hours if needed  Dispense: 90 tablet; Refill: 0  2. Flu vaccine need  - Flu Vaccine QUAD 36+ mos IM  3. Mild intermittent asthma without complication  Doing well at this  time  4. Need for hepatitis B screening test  - Hepatitis B Surface AntiBODY  5. Gastro-esophageal reflux disease without esophagitis  Continue medication   6. Left upper quadrant pain  - Gliadin antibodies, serum - Tissue transglutaminase, IgA - Reticulin Antibody, IgA w reflex titer

## 2017-07-25 LAB — GLIADIN ANTIBODIES, SERUM
Gliadin IgA: 3 Units
Gliadin IgG: 2 Units

## 2017-07-25 LAB — TISSUE TRANSGLUTAMINASE, IGA: (TTG) AB, IGA: 1 U/mL

## 2017-07-25 LAB — HEPATITIS B SURFACE ANTIBODY,QUALITATIVE: HEP B S AB: REACTIVE — AB

## 2017-07-25 LAB — RETICULIN ANTIBODIES, IGA W TITER: Reticulin IGA Screen: NEGATIVE

## 2017-07-31 ENCOUNTER — Encounter: Payer: Self-pay | Admitting: *Deleted

## 2017-08-20 ENCOUNTER — Ambulatory Visit: Payer: 59 | Admitting: Neurology

## 2017-09-17 ENCOUNTER — Telehealth: Payer: Self-pay | Admitting: Gastroenterology

## 2017-09-17 ENCOUNTER — Other Ambulatory Visit: Payer: Self-pay

## 2017-09-17 NOTE — Telephone Encounter (Signed)
Please call Helen Green to schedule her other procedures.

## 2017-09-18 ENCOUNTER — Other Ambulatory Visit: Payer: Self-pay

## 2017-09-18 DIAGNOSIS — R1013 Epigastric pain: Principal | ICD-10-CM

## 2017-09-18 DIAGNOSIS — G8929 Other chronic pain: Secondary | ICD-10-CM

## 2017-09-18 NOTE — Telephone Encounter (Signed)
Diagnosis for colonoscopy and EGD is chronic abdominal pain.

## 2017-09-23 ENCOUNTER — Telehealth: Payer: Self-pay | Admitting: Gastroenterology

## 2017-09-23 ENCOUNTER — Other Ambulatory Visit: Payer: Self-pay

## 2017-09-23 MED ORDER — PEG 3350-KCL-NA BICARB-NACL 420 G PO SOLR: 4000.0000 mL | Freq: Once | ORAL | 0 refills | Status: AC

## 2017-09-23 NOTE — Telephone Encounter (Signed)
Patient is having her procedure Friday and needs RX, directions, and needs to know if she is having colonoscopy and EGD. Please call today

## 2017-09-27 ENCOUNTER — Ambulatory Visit: Payer: 59 | Admitting: Anesthesiology

## 2017-09-27 ENCOUNTER — Encounter: Payer: Self-pay | Admitting: *Deleted

## 2017-09-27 ENCOUNTER — Ambulatory Visit
Admission: RE | Admit: 2017-09-27 | Discharge: 2017-09-27 | Disposition: A | Discharge status: Home / Self Care | Payer: 59 | Source: Ambulatory Visit | Attending: Gastroenterology | Admitting: Gastroenterology

## 2017-09-27 ENCOUNTER — Encounter
Admission: RE | Disposition: A | Discharge status: Home / Self Care | Payer: Self-pay | Source: Ambulatory Visit | Attending: Gastroenterology

## 2017-09-27 DIAGNOSIS — K2950 Unspecified chronic gastritis without bleeding: Secondary | ICD-10-CM | POA: Diagnosis not present

## 2017-09-27 DIAGNOSIS — R1013 Epigastric pain: Secondary | ICD-10-CM

## 2017-09-27 DIAGNOSIS — K219 Gastro-esophageal reflux disease without esophagitis: Secondary | ICD-10-CM | POA: Diagnosis not present

## 2017-09-27 DIAGNOSIS — Z79899 Other long term (current) drug therapy: Secondary | ICD-10-CM | POA: Diagnosis not present

## 2017-09-27 DIAGNOSIS — K2960 Other gastritis without bleeding: Secondary | ICD-10-CM | POA: Diagnosis not present

## 2017-09-27 DIAGNOSIS — G8929 Other chronic pain: Secondary | ICD-10-CM

## 2017-09-27 DIAGNOSIS — K580 Irritable bowel syndrome with diarrhea: Secondary | ICD-10-CM | POA: Diagnosis not present

## 2017-09-27 DIAGNOSIS — R109 Unspecified abdominal pain: Secondary | ICD-10-CM | POA: Diagnosis present

## 2017-09-27 DIAGNOSIS — D123 Benign neoplasm of transverse colon: Secondary | ICD-10-CM | POA: Diagnosis not present

## 2017-09-27 DIAGNOSIS — Z13810 Encounter for screening for upper gastrointestinal disorder: Secondary | ICD-10-CM | POA: Diagnosis not present

## 2017-09-27 DIAGNOSIS — K635 Polyp of colon: Secondary | ICD-10-CM | POA: Diagnosis not present

## 2017-09-27 HISTORY — PX: COLONOSCOPY WITH PROPOFOL: SHX5780

## 2017-09-27 HISTORY — PX: ESOPHAGOGASTRODUODENOSCOPY (EGD) WITH PROPOFOL: SHX5813

## 2017-09-27 SURGERY — ESOPHAGOGASTRODUODENOSCOPY (EGD) WITH PROPOFOL
Anesthesia: General

## 2017-09-27 MED ORDER — PROPOFOL 500 MG/50ML IV EMUL
INTRAVENOUS | Status: DC | PRN
  Administered 2017-09-27: 140 ug/kg/min via INTRAVENOUS

## 2017-09-27 MED ORDER — SODIUM CHLORIDE 0.9 % IV SOLN
INTRAVENOUS | Status: DC
  Administered 2017-09-27: 11:00:00 via INTRAVENOUS

## 2017-09-27 MED ORDER — MIDAZOLAM HCL 2 MG/2ML IJ SOLN
INTRAMUSCULAR | Status: AC
  Filled 2017-09-27: qty 2

## 2017-09-27 MED ORDER — MIDAZOLAM HCL 5 MG/5ML IJ SOLN
INTRAMUSCULAR | Status: DC | PRN
  Administered 2017-09-27: 1 mg via INTRAVENOUS

## 2017-09-27 MED ORDER — PROPOFOL 500 MG/50ML IV EMUL
INTRAVENOUS | Status: AC
  Filled 2017-09-27: qty 50

## 2017-09-27 NOTE — H&P (Signed)
Jonathon Bellows, MD [address] [place], [place], [place], [address] [address], [place], [place], [place], [address] Phone: [phone]  Fax: [phone]  Primary Care Physician:  Steele Sizer, MD   Pre-Procedure History & Physical: HPI:  Helen Green is a 51 y.o. female is here for an endoscopy and colonoscopy    Past Medical History:  Diagnosis Date  . Allergy   . Chronic abdominal pain   . Chronic insomnia   . Food allergy   . Functional dyspepsia   . GERD (gastroesophageal reflux disease)   . IBS (irritable bowel syndrome)   . Insomnia   . Irritable bowel syndrome with diarrhea   . Left arm weakness   . Neck pain   . Subacute maxillary sinusitis   . Vitamin A deficiency     Past Surgical History:  Procedure Laterality Date  . ABDOMINAL HYSTERECTOMY    . TUBAL LIGATION      Prior to Admission medications   Medication Sig Start Date End Date Taking? Authorizing Provider  dicyclomine (BENTYL) 10 MG capsule Take 1 capsule (10 mg total) by mouth 4 (four) times daily -  before meals and at bedtime. 07/22/17  Yes Sowles, Drue Stager, MD  fexofenadine [facility] ALLERGY) 180 MG tablet Take 1 tablet (180 mg total) by mouth as needed. 07/12/15  Yes Sowles, Drue Stager, MD  hyoscyamine (LEVSIN, ANASPAZ) 0.125 MG tablet take 1 tablet by mouth every 4 hours if needed 07/22/17  Yes Sowles, Drue Stager, MD  albuterol (PROVENTIL HFA;VENTOLIN HFA) 108 (90 Base) MCG/ACT inhaler Inhale 2 puffs into the lungs every 6 (six) hours as needed for wheezing or shortness of breath. Patient not taking: Reported on 07/22/2017 03/26/16   Adline Potter, MD  baclofen (LIORESAL) 20 MG tablet Take 1 tablet (20 mg total) by mouth 3 (three) times daily. Patient not taking: Reported on 07/22/2017 04/19/17   Steele Sizer, MD  fluticasone [facility]) 50 MCG/ACT nasal spray instill 2 spray into each nostril at bedtime Patient taking differently: instill 2 spray into each nostril at bedtime as needed 01/31/16    Steele Sizer, MD  fluticasone furoate-vilanterol (BREO ELLIPTA) 100-25 MCG/INH AEPB Inhale 1 puff into the lungs daily. Patient not taking: Reported on 07/22/2017 02/14/17   Steele Sizer, MD  montelukast (SINGULAIR) 10 MG tablet Take 1 tablet (10 mg total) by mouth at bedtime. Patient not taking: Reported on 07/22/2017 02/14/17   Steele Sizer, MD  nortriptyline (PAMELOR) 10 MG capsule Take 1-2 capsules (10-20 mg total) by mouth at bedtime. Patient not taking: Reported on 07/22/2017 05/09/17   Steele Sizer, MD  omeprazole (PRILOSEC) 40 MG capsule Take 1 capsule (40 mg total) by mouth daily. 07/12/15   Steele Sizer, MD  ranitidine (ZANTAC) 150 MG tablet Take by mouth.    [provider]    Allergies as of 09/18/2017 - Review Complete 07/22/2017  Allergen Reaction Noted  . Apple Hives, Shortness Of Breath, and Swelling 05/03/2015  . Daucus carota Hives, Shortness Of Breath, and Swelling 05/03/2015  . Other Hives, Shortness Of Breath, and Swelling 05/03/2015  . Avocado  05/03/2015    Family History  Problem Relation Age of Onset  . Diabetes Mother   . Hyperlipidemia Mother   . Hypertension Mother   . Liver disease Father   . Cancer Brother 58       brain cancer  . Breast cancer Neg Hx     Social History   Socioeconomic History  . Marital status: Married  Spouse name: Not on file  . Number of children: Not on file  . Years of education: Not on file  . Highest education level: Not on file  Social Needs  . Financial resource strain: Not on file  . Food insecurity - worry: Not on file  . Food insecurity - inability: Not on file  . Transportation needs - medical: Not on file  . Transportation needs - non-medical: Not on file  Occupational History  . 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 Smoking status: Never Smoker  . Smokeless tobacco: Never Used  Substance and Sexual Activity  . Alcohol use: No    Alcohol/week:  0.0 oz  . Drug use: No  . Sexual activity: Yes  Other Topics Concern  . Not on file  Social History Narrative   On her second marriage, helping raise her husband's 64 yo niece.    Review of Systems: See HPI, otherwise negative ROS  Physical Exam: BP 110/73   Pulse 74   Temp 97.7 F (36.5 C) (Tympanic)   Resp (!) 22   Ht 5\' 3"  (1.6 m)   Wt 135 lb (61.2 kg)   SpO2 100%   BMI 23.91 kg/m  General:   Alert,  pleasant and cooperative in NAD Head:  Normocephalic and atraumatic. Neck:  Supple; no masses or thyromegaly. Lungs:  Clear throughout to auscultation, normal respiratory effort.    Heart:  +S1, +S2, Regular rate and rhythm, No edema. Abdomen:  Soft, nontender and nondistended. Normal bowel sounds, without guarding, and without rebound.   Neurologic:  Alert and  oriented x4;  grossly normal neurologically.  Impression/Plan: Helen Green is here for an endoscopy and colonoscopy  to be performed for  evaluation of abdominal pain     Risks, benefits, limitations, and alternatives regarding endoscopy have been reviewed with the patient.  Questions have been answered.  All parties agreeable.   Jonathon Bellows, MD  09/27/2017, 11:06 AM

## 2017-09-27 NOTE — Transfer of Care (Signed)
Immediate Anesthesia Transfer of Care Note  Patient: Helen Green  Procedure(s) Performed: ESOPHAGOGASTRODUODENOSCOPY (EGD) WITH PROPOFOL (N/A ) COLONOSCOPY WITH PROPOFOL (N/A )  Patient Location: PACU  Anesthesia Type:General  Level of Consciousness: sedated  Airway & Oxygen Therapy: Patient Spontanous Breathing  Post-op Assessment: Report given to RN  Post vital signs: stable  Last Vitals:  Vitals:   09/27/17 1034 09/27/17 1230  BP: 110/73   Pulse: 74   Resp: (!) 22   Temp: 36.5 C (!) 36.3 C  SpO2: 100%     Last Pain:  Vitals:   09/27/17 1230  TempSrc: Tympanic         Complications: No apparent anesthesia complications

## 2017-09-27 NOTE — Addendum Note (Signed)
Addendum  created 09/27/17 1619 by Gunnar Fusi, MD   Charge Capture section accepted

## 2017-09-27 NOTE — Op Note (Signed)
[facility] Gastroenterology Patient Name: Helen Green Procedure Date: 09/27/2017 12:02 PM MRN: [id number] Account #: [id number] Date of Birth: 1965-12-25 Admit Type: Outpatient Age: 51 Room: [facility] ENDO ROOM 4 Gender: Female Note Status: Finalized Procedure:            Colonoscopy Indications:          Abdominal pain Providers:            Jonathon Bellows MD, MD Referring MD:         Bethena Roys. Sowles, MD (Referring MD) Medicines:            Monitored Anesthesia Care Complications:        No immediate complications. Procedure:            Pre-Anesthesia Assessment:                       - Prior to the procedure, a History and Physical was                        performed, and patient medications, allergies and                        sensitivities were reviewed. The patient's tolerance of                        previous anesthesia was reviewed.                       - The risks and benefits of the procedure and the                        sedation options and risks were discussed with the                        patient. All questions were answered and informed                        consent was obtained.                       - ASA Grade Assessment: II - A patient with mild                        systemic disease.                       After obtaining informed consent, the colonoscope was                        passed under direct vision. Throughout the procedure,                        the patient's blood pressure, pulse, and oxygen                        saturations were monitored continuously. The                        Colonoscope was introduced through the anus and                        advanced  to the the cecum, identified by the                        appendiceal orifice, IC valve and transillumination.                        The colonoscopy was performed with ease. The patient                        tolerated the procedure well. The quality of the bowel                  preparation was good. Findings:      The perianal and digital rectal examinations were normal.      A 3 mm polyp was found in the transverse colon. The polyp was sessile.       The polyp was removed with a cold biopsy forceps. Resection and       retrieval were complete.      The exam was otherwise without abnormality on direct and retroflexion       views. Impression:           - One 3 mm polyp in the transverse colon, removed with                        a cold biopsy forceps. Resected and retrieved.                       - The examination was otherwise normal on direct and                        retroflexion views. Recommendation:       - Discharge patient to home (with escort).                       - Resume previous diet.                       - Continue present medications.                       - Await pathology results.                       - Repeat colonoscopy in 5-10 years for surveillance                        based on pathology results. Procedure Code(s):    --- Professional ---                       [phone], Colonoscopy, flexible; with biopsy, single or                        multiple Diagnosis Code(s):    --- Professional ---                       D12.3, Benign neoplasm of transverse colon (hepatic                        flexure or splenic flexure)  R10.9, Unspecified abdominal pain CPT copyright 2016 American Medical Association. All rights reserved. The codes documented in this report are preliminary and upon coder review may  be revised to meet current compliance requirements. Jonathon Bellows, MD Jonathon Bellows MD, MD 09/27/2017 12:26:29 PM This report has been signed electronically. Number of Addenda: 0 Note Initiated On: 09/27/2017 12:02 PM Scope Withdrawal Time: 0 hours 8 minutes 45 seconds  Total Procedure Duration: 0 hours 12 minutes 2 seconds       [facility]

## 2017-09-27 NOTE — Op Note (Signed)
[facility] Gastroenterology Patient Name: Helen Green Procedure Date: 09/27/2017 12:04 PM MRN: [id number] Account #: [id number] Date of Birth: 1966-09-15 Admit Type: Outpatient Age: 51 Room: [facility] ENDO ROOM 4 Gender: Female Note Status: Finalized Procedure:            Upper GI endoscopy Indications:          Abdominal pain Providers:            Jonathon Bellows MD, MD Referring MD:         Bethena Roys. Sowles, MD (Referring MD) Medicines:            Monitored Anesthesia Care Complications:        No immediate complications. Procedure:            Pre-Anesthesia Assessment:                       - Prior to the procedure, a History and Physical was                        performed, and patient medications, allergies and                        sensitivities were reviewed. The patient's tolerance of                        previous anesthesia was reviewed.                       - The risks and benefits of the procedure and the                        sedation options and risks were discussed with the                        patient. All questions were answered and informed                        consent was obtained.                       - ASA Grade Assessment: II - A patient with mild                        systemic disease.                       After obtaining informed consent, the endoscope was                        passed under direct vision. Throughout the procedure,                        the patient's blood pressure, pulse, and oxygen                        saturations were monitored continuously. The                        Colonoscope was introduced through the mouth, and  advanced to the third part of duodenum. The upper GI                        endoscopy was accomplished with ease. The patient                        tolerated the procedure well. Findings:      The examined duodenum was normal.      The esophagus was normal.      The  entire examined stomach was normal. Biopsies were taken with a cold       forceps for histology. Impression:           - Normal examined duodenum.                       - Normal esophagus.                       - Normal stomach. Biopsied. Recommendation:       - Await pathology results.                       - Perform a colonoscopy today. Procedure Code(s):    --- Professional ---                       [phone], Esophagogastroduodenoscopy, flexible, transoral;                        with biopsy, single or multiple Diagnosis Code(s):    --- Professional ---                       R10.9, Unspecified abdominal pain CPT copyright 2016 American Medical Association. All rights reserved. The codes documented in this report are preliminary and upon coder review may  be revised to meet current compliance requirements. Jonathon Bellows, MD Jonathon Bellows MD, MD 09/27/2017 12:11:30 PM This report has been signed electronically. Number of Addenda: 0 Note Initiated On: 09/27/2017 12:04 PM      [facility]

## 2017-09-27 NOTE — Anesthesia Preprocedure Evaluation (Signed)
Anesthesia Evaluation  Patient identified by MRN, date of birth, ID band Patient awake    Reviewed: Allergy & Precautions, NPO status , Patient's Chart, lab work & pertinent test results  History of Anesthesia Complications Negative for: history of anesthetic complications  Airway Mallampati: II       Dental   Pulmonary neg sleep apnea, neg COPD,  Seasonal allergies, no meds in months          Cardiovascular (-) hypertension(-) Past MI and (-) CHF (-) dysrhythmias (-) Valvular Problems/Murmurs     Neuro/Psych Anxiety Depression    GI/Hepatic Neg liver ROS, GERD  Medicated and Controlled,  Endo/Other  neg diabetes  Renal/GU negative Renal ROS     Musculoskeletal   Abdominal   Peds  Hematology   Anesthesia Other Findings   Reproductive/Obstetrics                           Anesthesia Physical Anesthesia Plan  ASA: II  Anesthesia Plan: General   Post-op Pain Management:    Induction:   PONV Risk Score and Plan: 3 and TIVA, Propofol infusion, Treatment may vary due to age or medical condition and Midazolam  Airway Management Planned: Nasal Cannula  Additional Equipment:   Intra-op Plan:   Post-operative Plan:   Informed Consent: I have reviewed the patients History and Physical, chart, labs and discussed the procedure including the risks, benefits and alternatives for the proposed anesthesia with the patient or authorized representative who has indicated his/her understanding and acceptance.     Plan Discussed with:   Anesthesia Plan Comments:         Anesthesia Quick Evaluation

## 2017-09-27 NOTE — Anesthesia Postprocedure Evaluation (Signed)
Anesthesia Post Note  Patient: Helen Green  Procedure(s) Performed: ESOPHAGOGASTRODUODENOSCOPY (EGD) WITH PROPOFOL (N/A ) COLONOSCOPY WITH PROPOFOL (N/A )  Patient location during evaluation: Endoscopy Anesthesia Type: General Level of consciousness: awake and alert Pain management: pain level controlled Vital Signs Assessment: post-procedure vital signs reviewed and stable Respiratory status: spontaneous breathing and respiratory function stable Cardiovascular status: stable Anesthetic complications: no     Last Vitals:  Vitals:   09/27/17 1034 09/27/17 1230  BP: 110/73 (!) 93/59  Pulse: 74 76  Resp: (!) 22 16  Temp: 36.5 C (!) 36.3 C  SpO2: 100% 99%    Last Pain:  Vitals:   09/27/17 1230  TempSrc: Tympanic                 KEPHART,WILLIAM K

## 2017-09-27 NOTE — Anesthesia Post-op Follow-up Note (Signed)
Anesthesia QCDR form completed.        

## 2017-09-30 ENCOUNTER — Encounter: Payer: Self-pay | Admitting: Gastroenterology

## 2017-09-30 LAB — SURGICAL PATHOLOGY

## 2017-10-06 ENCOUNTER — Encounter: Payer: Self-pay | Admitting: Gastroenterology

## 2017-10-24 ENCOUNTER — Telehealth: Payer: Self-pay | Admitting: Gastroenterology

## 2017-10-24 NOTE — Telephone Encounter (Signed)
Yes send omeprazole

## 2017-10-24 NOTE — Telephone Encounter (Signed)
Patient stated that she is experiencing stomach discomfort, and burning in her chest.  She stated that she is not taking omeprazole although it is listed in her chart.  Can we send a rx to pharmacy for her to resume Omeprazole 40mg  one a day?  She has not been taking Bentyl as directed.  She has been advised to resume Bentyl taking it before meals and bedtime to see if this helps. Advised her to avoid spicy foods, eat smaller portion meals, avoid fatty foods as this can irritate her stomach lining.  Thanks [employer]

## 2017-10-24 NOTE — Telephone Encounter (Signed)
Please call patient as she has questions on her EGD and Colonoscopy result.

## 2017-10-25 ENCOUNTER — Telehealth: Payer: Self-pay

## 2017-10-25 ENCOUNTER — Other Ambulatory Visit: Payer: Self-pay

## 2017-10-25 DIAGNOSIS — K219 Gastro-esophageal reflux disease without esophagitis: Secondary | ICD-10-CM

## 2017-10-25 MED ORDER — OMEPRAZOLE 40 MG PO CPDR: 40.0000 mg | DELAYED_RELEASE_CAPSULE | Freq: Every day | ORAL | 5 refills | Status: DC

## 2017-10-25 NOTE — Telephone Encounter (Signed)
Patient notified rx has been sent to pharmacy for omeprazole 40 mg one daily. Thanks [employer]

## 2017-11-25 ENCOUNTER — Other Ambulatory Visit: Payer: Self-pay

## 2017-11-25 ENCOUNTER — Encounter: Payer: Self-pay | Admitting: Obstetrics and Gynecology

## 2017-11-25 ENCOUNTER — Ambulatory Visit: Payer: 59 | Admitting: Obstetrics and Gynecology

## 2017-11-25 VITALS — BP 110/68 | HR 72 | Ht 62.0 in | Wt 137.0 lb

## 2017-11-25 DIAGNOSIS — R102 Pelvic and perineal pain: Secondary | ICD-10-CM | POA: Diagnosis not present

## 2017-11-25 LAB — POCT URINALYSIS DIPSTICK
BILIRUBIN UA: NEGATIVE
Blood, UA: NEGATIVE
GLUCOSE UA: NEGATIVE
Ketones, UA: NEGATIVE
Leukocytes, UA: NEGATIVE
Nitrite, UA: NEGATIVE
Protein, UA: NEGATIVE
Spec Grav, UA: 1.01 (ref 1.010–1.025)
pH, UA: 5 (ref 5.0–8.0)

## 2017-11-25 NOTE — Progress Notes (Signed)
Chief Complaint  Patient presents with  . Pelvic Pain    Intermittent x1 mo    HPI:      [name] is a 51 y.o. G2P2 who LMP was No LMP recorded. Patient has had a hysterectomy., presents today for NP eval of pelvic pain for the past month. Sx started after carrying boxes at work. She then felt like she had to bend over for pain relief. Sx have persisted and are a dull ache, unless she wears heels and walks a lot or with lifting. No problems sleeping, in fact resting help sx. Can't take NSAIDs due to gastritis. She has had urinary frequency/urgency without dysuria for the past 2 wks. No GI sx even though has hx of IBS-D. No vag sx. She is s/p lap supracx hyst about 2009 for leio. Has bilat ovaries. Had Neg CT scan 7/18 for LLQ pain.  Annuals with PCP.   Past Medical History:  Diagnosis Date  . Allergy   . Chronic abdominal pain   . Chronic insomnia   . Food allergy   . Functional dyspepsia   . GERD (gastroesophageal reflux disease)   . IBS (irritable bowel syndrome)   . Insomnia   . Irritable bowel syndrome with diarrhea   . Left arm weakness   . Neck pain   . Subacute maxillary sinusitis   . Vitamin A deficiency     Past Surgical History:  Procedure Laterality Date  . ABDOMINAL HYSTERECTOMY    . COLONOSCOPY WITH PROPOFOL N/A 09/27/2017   Procedure: COLONOSCOPY WITH PROPOFOL;  Surgeon: Jonathon Bellows, MD;  Location: [facility] ENDOSCOPY;  Service: Gastroenterology;  Laterality: N/A;  . ESOPHAGOGASTRODUODENOSCOPY (EGD) WITH PROPOFOL N/A 09/27/2017   Procedure: ESOPHAGOGASTRODUODENOSCOPY (EGD) WITH PROPOFOL;  Surgeon: Jonathon Bellows, MD;  Location: [facility] ENDOSCOPY;  Service: Gastroenterology;  Laterality: N/A;  . TUBAL LIGATION      Family History  Problem Relation Age of Onset  . Diabetes Mother   . Hyperlipidemia Mother   . Hypertension Mother   . Liver disease Father   . Cancer Brother 74       brain cancer  . Breast cancer Neg Hx     Social History   Socioeconomic  History  . Marital status: Married    Spouse name: Not on file  . Number of children: Not on file  . Years of education: Not on file  . Highest education level: Not on file  Social Needs  . Financial resource strain: Not on file  . Food insecurity - worry: Not on file  . Food insecurity - inability: Not on file  . Transportation needs - medical: Not on file  . Transportation needs - non-medical: Not on file  Occupational History  . 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 Smoking status: Never Smoker  . Smokeless tobacco: Never Used  Substance and Sexual Activity  . Alcohol use: No    Alcohol/week: 0.0 oz  . Drug use: No  . Sexual activity: Yes  Other Topics Concern  . Not on file  Social History Narrative   On her second marriage, helping raise her husband's 71 yo niece.     Current Outpatient Medications:  .  albuterol (PROVENTIL HFA;VENTOLIN HFA) 108 (90 Base) MCG/ACT inhaler, Inhale 2 puffs into the lungs every 6 (six) hours as needed for wheezing or shortness of breath., Disp: 1 Inhaler, Rfl: 2 .  dicyclomine (BENTYL) 10 MG capsule, Take 1 capsule (  10 mg total) by mouth 4 (four) times daily -  before meals and at bedtime., Disp: 90 capsule, Rfl: 0 .  fexofenadine (ALLEGRA ALLERGY) 180 MG tablet, Take 1 tablet (180 mg total) by mouth as needed., Disp: 30 tablet, Rfl: 5 .  fluticasone (FLONASE) 50 MCG/ACT nasal spray, instill 2 spray into each nostril at bedtime (Patient taking differently: instill 2 spray into each nostril at bedtime as needed), Disp: 16 g, Rfl: 2 .  hyoscyamine (LEVSIN, ANASPAZ) 0.125 MG tablet, take 1 tablet by mouth every 4 hours if needed, Disp: 90 tablet, Rfl: 0 .  omeprazole (PRILOSEC) 40 MG capsule, Take 1 capsule (40 mg total) by mouth daily., Disp: 30 capsule, Rfl: 5 .  ranitidine (ZANTAC) 150 MG tablet, Take by mouth., Disp: , Rfl:  .  baclofen (LIORESAL) 20 MG tablet, Take 1 tablet (20 mg total) by mouth 3  (three) times daily. (Patient not taking: Reported on 07/22/2017), Disp: 30 each, Rfl: 0 .  fluticasone furoate-vilanterol (BREO ELLIPTA) 100-25 MCG/INH AEPB, Inhale 1 puff into the lungs daily. (Patient not taking: Reported on 07/22/2017), Disp: 60 each, Rfl: 1 .  montelukast (SINGULAIR) 10 MG tablet, Take 1 tablet (10 mg total) by mouth at bedtime. (Patient not taking: Reported on 07/22/2017), Disp: 30 tablet, Rfl: 3 .  nortriptyline (PAMELOR) 10 MG capsule, Take 1-2 capsules (10-20 mg total) by mouth at bedtime. (Patient not taking: Reported on 07/22/2017), Disp: 60 capsule, Rfl: 0   ROS:  Review of Systems  Constitutional: Negative for fatigue, fever and unexpected weight change.  Respiratory: Negative for cough, shortness of breath and wheezing.   Cardiovascular: Negative for chest pain, palpitations and leg swelling.  Gastrointestinal: Negative for blood in stool, constipation, diarrhea, nausea and vomiting.  Endocrine: Negative for cold intolerance, heat intolerance and polyuria.  Genitourinary: Positive for frequency, pelvic pain and urgency. Negative for dyspareunia, dysuria, flank pain, genital sores, hematuria, menstrual problem, vaginal bleeding, vaginal discharge and vaginal pain.  Musculoskeletal: Negative for back pain, joint swelling and myalgias.  Skin: Negative for rash.  Neurological: Negative for dizziness, syncope, light-headedness, numbness and headaches.  Hematological: Negative for adenopathy.  Psychiatric/Behavioral: Negative for agitation, confusion, sleep disturbance and suicidal ideas. The patient is not nervous/anxious.      OBJECTIVE:   Vitals:  BP 110/68 (BP Location: Left Arm, Patient Position: Sitting, Cuff Size: Normal)   Pulse 72   Ht 5\' 2"  (1.575 m)   Wt 137 lb (62.1 kg)   BMI 25.06 kg/m   Physical Exam  Constitutional: She is oriented to person, place, and time and well-developed, well-nourished, and in no distress. Vital signs are normal.    Abdominal: Soft. Normal appearance. She exhibits no distension and no mass. There is tenderness in the right lower quadrant, suprapubic area and left lower quadrant.  Genitourinary: Vagina normal, cervix normal and vulva normal. Uterus is tender. Cervix exhibits no motion tenderness and no tenderness. Right adnexum displays tenderness. Right adnexum displays no mass. Left adnexum displays tenderness. Left adnexum displays no mass. Vulva exhibits no erythema, no exudate, no lesion, no rash and no tenderness. Vagina exhibits no lesion.  Neurological: She is oriented to person, place, and time.  Vitals reviewed.   Results: Results for orders placed or performed in visit on 11/25/17 (from the past 24 hour(s))  POCT Urinalysis Dipstick     Status: Normal   Collection Time: 11/25/17 11:02 AM  Result Value Ref Range   Color, UA straw    Clarity, UA clear  Glucose, UA neg    Bilirubin, UA neg    Ketones, UA neg    Spec Grav, UA 1.010 1.010 - 1.025   Blood, UA neg    pH, UA 5.0 5.0 - 8.0   Protein, UA neg    Urobilinogen, UA  0.2 or 1.0 E.U./dL   Nitrite, UA neg    Leukocytes, UA Negative Negative   Appearance     Odor       Assessment/Plan: Suprapubic pain - Neg dip, mildly tender on exam. S/p LSH. Check C&S and u/s. If neg, question MSK since occurred after carrying boxes. Try heating pad. - Plan: POCT Urinalysis Dipstick, Urine Culture, US PELVIS TRANSVANGINAL NON-OB (TV ONLY)    Return in about 1 day (around 11/26/2017) for GYN u/s for pelvic pain--[facility] to call pt.  Josepha Barbier B. Chryl Holten, PA-C 11/25/2017 11:12 AM

## 2017-11-25 NOTE — Patient Instructions (Signed)
I value your feedback and entrusting us with your care. If you get a [place] patient survey, I would appreciate you taking the time to let us know about your experience today. Thank you! 

## 2017-11-26 ENCOUNTER — Ambulatory Visit (INDEPENDENT_AMBULATORY_CARE_PROVIDER_SITE_OTHER): Payer: 59

## 2017-11-26 DIAGNOSIS — R102 Pelvic and perineal pain: Secondary | ICD-10-CM | POA: Diagnosis not present

## 2017-11-27 LAB — URINE CULTURE: Organism ID, Bacteria: NO GROWTH

## 2017-12-21 ENCOUNTER — Other Ambulatory Visit: Payer: Self-pay | Admitting: Family Medicine

## 2017-12-21 DIAGNOSIS — K580 Irritable bowel syndrome with diarrhea: Secondary | ICD-10-CM

## 2018-02-07 ENCOUNTER — Telehealth: Payer: Self-pay | Admitting: Family Medicine

## 2018-02-07 NOTE — Telephone Encounter (Signed)
Copied from [place] [phone]. Topic: Quick Communication - Rx Refill/Question >> Feb 07, 2018  9:36 AM Clack, Laban Emperor wrote: Medication: fluticasone (FLONASE) 50 MCG/ACT nasal spray [[id number]]  Has the patient contacted their pharmacy? Yes.   (Agent: If no, request that the patient contact the pharmacy for the refill.) Preferred Pharmacy (with phone number or street name): [facility] [place], [address] AT [address] [phone] (Phone) [phone] (Fax)     Agent: Please be advised that RX refills may take up to 3 business days. We ask that you follow-up with your pharmacy.

## 2018-02-11 ENCOUNTER — Other Ambulatory Visit: Payer: Self-pay

## 2018-02-11 MED ORDER — FLUTICASONE PROPIONATE 50 MCG/ACT NA SUSP: NASAL | 2 refills | Status: DC

## 2018-02-11 NOTE — Telephone Encounter (Signed)
Refill request for general medication: Flonase Nasal Spray   Last office visit: 07/22/2017  Last physical exam: None indicated   Follow-ups on file. None indicated

## 2018-02-11 NOTE — Telephone Encounter (Signed)
Routed Rx request to PCP.

## 2018-02-24 ENCOUNTER — Ambulatory Visit: Payer: 59 | Admitting: Family Medicine

## 2018-03-03 ENCOUNTER — Ambulatory Visit: Payer: 59 | Admitting: Nurse Practitioner

## 2018-03-07 ENCOUNTER — Encounter: Payer: Self-pay | Admitting: Nurse Practitioner

## 2018-03-07 ENCOUNTER — Ambulatory Visit: Payer: 59 | Admitting: Nurse Practitioner

## 2018-03-07 VITALS — BP 108/66 | HR 80 | Temp 98.5°F | Resp 16 | Ht 62.0 in | Wt 138.6 lb

## 2018-03-07 DIAGNOSIS — L639 Alopecia areata, unspecified: Secondary | ICD-10-CM | POA: Diagnosis not present

## 2018-03-07 NOTE — Patient Instructions (Signed)
Alopecia Areata, Adult Alopecia areata is a condition that causes you to lose hair. You may lose hair on your scalp in patches. In some cases, you may lose all the hair on your scalp (alopecia totalis) or all the hair from your face and body (alopecia universalis). Alopecia areata is an autoimmune disease. This means that your body's defense system (immune system) mistakes normal parts of the body for germs or other things that can make you sick. When you have alopecia areata, the immune system attacks the hair follicles. Alopecia areata usually develops in childhood, but it can develop at any age. For some people, their hair grows back on its own and hair loss does not happen again. For others, their hair may fall out and grow back in cycles. The hair loss may last many years. Having this condition can be emotionally difficult, but it is not dangerous. What are the causes? The cause of this condition is not known. What increases the risk? This condition is more likely to develop in people who have:  A family history of alopecia.  A family history of another autoimmune disease, including type 1 diabetes and rheumatoid arthritis.  Asthma and allergies.  Down syndrome.  What are the signs or symptoms? Round spots of patchy hair loss on the scalp is the main symptom of this condition. The spots may be mildly itchy. Other symptoms include:  Short dark hairs in the bald patches that are wider at the top (exclamation point hairs).  Dents, white spots, or lines in the fingernails or toenails.  Balding and body hair loss. This is rare.  How is this diagnosed? This condition is diagnosed based on your symptoms and family history. Your health care provider will also check your scalp skin, teeth, and nails. Your health care provider may refer you to a specialist in hair and skin disorders (dermatologist). You may also have tests, including:  A hair pull test.  Blood tests or other screening tests  to check for autoimmune diseases, such as thyroid disease or diabetes.  Skin biopsy to confirm the diagnosis.  A procedure to examine the skin with a lighted magnifying instrument (dermoscopy).  How is this treated? There is no cure for alopecia areata. Treatment is aimed at promoting the regrowth of hair and preventing the immune system from overreacting. No single treatment is right for all people with alopecia areata. It depends on the type of hair loss you have and how severe it is. Work with your health care provider to find the best treatment for you. Treatment may include:  Having regular checkups to make sure the condition is not getting worse (watchful waiting).  Steroid creams or pills for 6-8 weeks to stop the immune reaction and help hair to regrow more quickly.  Other topical medicines to alter the immune system response and support the hair growth cycle.  Steroid injections.  Therapy and counseling with a support group or therapist if you are having trouble coping with hair loss.  Follow these instructions at home:  Learn as much as you can about your condition.  Apply topical creams only as told by your health care provider.  Take over-the-counter and prescription medicines only as told by your health care provider.  Consider getting a wig or products to make hair look fuller or to cover bald spots, if you feel uncomfortable with your appearance.  Get therapy or counseling if you are having a hard time coping with hair loss. Ask your health care  products to make hair look fuller or to cover bald spots, if you feel uncomfortable with your appearance.   Get therapy or counseling if you are having a hard time coping with hair loss. Ask your health care provider to recommend a counselor or support group.   Keep all follow-up visits as told by your health care provider. This is important.  Contact a health care provider if:   Your hair loss gets worse, even with treatment.   You have new symptoms.   You are struggling emotionally.  Summary   Alopecia areata is an autoimmune condition that makes your body's defense system (immune system) attack the hair follicles. This causes  you to lose hair.   Treatments may include regular checkups to make sure that the condition is not getting worse (watchful waiting), medicines, and steroid injections.  This information is not intended to replace advice given to you by your health care provider. Make sure you discuss any questions you have with your health care provider.  Document Released: 05/12/2004 Document Revised: 10/26/2016 Document Reviewed: 10/26/2016  [employer]vier Interactive Patient Education  2018 [employer]

## 2018-03-07 NOTE — Progress Notes (Addendum)
Name: Helen Green   MRN: [id number]    DOB: Sep 22, 1966   Date:03/07/2018       Progress Note  Subjective  Chief Complaint  Chief Complaint  Patient presents with  . Hair/Scalp Problem    patient presents with a "bald spot" in the topr/ right part of her scalp. she stated that there was no redness, no drainage, crusting or burning but some slight pain. patient has not tried any medications and stated that she has not changed anything with her hair care regimen.    HPI  Alopecia First noticed bald spot about a month ago when brush hair. First spot she has ever noticed.   Positive IBS, seasonal allergies  Denies hx of thyroid disease, asthma, eczema or other autoimmune disease    Patient Active Problem List   Diagnosis Date Noted  . Insomnia, persistent 05/03/2015  . Functional dyspepsia 05/03/2015  . Gastro-esophageal reflux disease without esophagitis 05/03/2015  . History of anemia 05/03/2015  . Perennial allergic rhinitis with seasonal variation 05/03/2015  . 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 Vitamin D deficiency 05/03/2015  . Irritable bowel syndrome with diarrhea 05/03/2015    Past Medical History:  Diagnosis Date  . Allergy   . Chronic abdominal pain   . Chronic insomnia   . Food allergy   . Functional dyspepsia   . GERD (gastroesophageal reflux disease)   . IBS (irritable bowel syndrome)   . Insomnia   . Irritable bowel syndrome with diarrhea   . Left arm weakness   . Neck pain   . Subacute maxillary sinusitis   . Vitamin A deficiency     Past Surgical History:  Procedure Laterality Date  . ABDOMINAL HYSTERECTOMY    . COLONOSCOPY WITH PROPOFOL N/A 09/27/2017   Procedure: COLONOSCOPY WITH PROPOFOL;  Surgeon: Jonathon Bellows, MD;  Location: [facility] ENDOSCOPY;  Service: Gastroenterology;  Laterality: N/A;  . ESOPHAGOGASTRODUODENOSCOPY (EGD) WITH PROPOFOL N/A 09/27/2017   Procedure: ESOPHAGOGASTRODUODENOSCOPY (EGD) WITH PROPOFOL;  Surgeon: Jonathon Bellows, MD;  Location: [facility] ENDOSCOPY;  Service: Gastroenterology;  Laterality: N/A;  . TUBAL LIGATION      Social History   Tobacco Use  . Smoking status: Never Smoker  . Smokeless tobacco: Never Used  Substance Use Topics  . Alcohol use: No    Alcohol/week: 0.0 oz     Current Outpatient Medications:  .  dicyclomine (BENTYL) 10 MG capsule, Take 1 capsule (10 mg total) by mouth 4 (four) times daily -  before meals and at bedtime., Disp: 90 capsule, Rfl: 0 .  omeprazole (PRILOSEC) 40 MG capsule, Take 1 capsule (40 mg total) by mouth daily., Disp: 30 capsule, Rfl: 5 .  albuterol (PROVENTIL HFA;VENTOLIN HFA) 108 (90 Base) MCG/ACT inhaler, Inhale 2 puffs into the lungs every 6 (six) hours as needed for wheezing or shortness of breath. (Patient not taking: Reported on 03/07/2018), Disp: 1 Inhaler, Rfl: 2 .  fexofenadine (ALLEGRA ALLERGY) 180 MG tablet, Take 1 tablet (180 mg total) by mouth as needed. (Patient not taking: Reported on 03/07/2018), Disp: 30 tablet, Rfl: 5 .  fluticasone (FLONASE) 50 MCG/ACT nasal spray, instill 2 spray into each nostril at bedtime as needed (Patient not taking: Reported on 03/07/2018), Disp: 16 g, Rfl: 2 .  hyoscyamine (LEVSIN, ANASPAZ) 0.125 MG tablet, TAKE 1 TABLET BY MOUTH EVERY 4 HOURS IF NEEDED, Disp: 90 tablet, Rfl: 0  Allergies  Allergen Reactions  . Apple Hives, Shortness Of Breath and Swelling  . Avocado Hives, Shortness Of  Breath and Swelling    and raw fruit and vegetables  . Daucus Carota Hives, Shortness Of Breath and Swelling  . Other Hives, Shortness Of Breath and Swelling    Pears, avacados, nuts    ROS   No other specific complaints in a complete review of systems (except as listed in HPI above).  Objective  Vitals:   03/07/18 1543  BP: 108/66  Pulse: 80  Resp: 16  Temp: 98.5 F (36.9 C)  TempSrc: Oral  SpO2: 97%  Weight: 138 lb 9.6 oz (62.9 kg)  Height: 5\' 2"  (1.575 m)     Body mass index is 25.35 kg/m.  Nursing Note and  Vital Signs reviewed.  Physical Exam   Constitutional: Patient appears well-developed and well-nourished. No distress.  HEENT: head atraumatic, normocephalic, small area 1/2 inch diameter on top of scalp- hairless, non-tender, no redness, drainage, or rash Cardiovascular: Normal rate Pulmonary/Chest: Effort normal and breath sounds clear. Psychiatric: Patient has a normal mood and affect. behavior is normal. Judgment and thought content normal.  No results found for this or any previous visit (from the past 72 hour(s)).  Assessment & Plan  1. Alopecia areata  - Ambulatory referral to Dermatology    -Red flags and when to present for emergency care or RTC including fever >101.80F, chest pain, shortness of breath, new/worsening/un-resolving symptoms, reviewed with patient at time of visit. Follow up and care instructions discussed and provided in AVS. --------------------------------------- I have reviewed this encounter including the documentation in this note and/or discussed this patient with the provider, Suezanne Cheshire DNP AGNP-C. I am certifying that I agree with the content of this note as supervising physician. Enid Derry, [facility] 03/19/2018, 3:47 PM

## 2018-04-30 ENCOUNTER — Other Ambulatory Visit: Payer: Self-pay | Admitting: Family Medicine

## 2018-04-30 DIAGNOSIS — K580 Irritable bowel syndrome with diarrhea: Secondary | ICD-10-CM

## 2018-04-30 NOTE — Telephone Encounter (Signed)
Copied from [place] [id number]. Topic: Quick Communication - Rx Refill/Question >> Apr 30, 2018  2:49 PM Bea Graff, NT wrote: Medication: hyoscyamine (LEVSIN, ANASPAZ) 0.125 MG tablet  Has the patient contacted their pharmacy? Yes.   (Agent: If no, request that the patient contact the pharmacy for the refill.) (Agent: If yes, when and what did the pharmacy advise?)  Preferred Pharmacy (with phone number or street name): [facility], [place] AT [address] [phone] (Phone) [phone] (Fax)      Agent: Please be advised that RX refills may take up to 3 business days. We ask that you follow-up with your pharmacy.

## 2018-04-30 NOTE — Telephone Encounter (Signed)
Refill request was sent to Dr. Krichna Sowles for approval and submission.  

## 2018-05-16 ENCOUNTER — Encounter: Payer: Self-pay | Admitting: Nurse Practitioner

## 2018-05-16 ENCOUNTER — Ambulatory Visit: Payer: 59 | Admitting: Nurse Practitioner

## 2018-05-16 ENCOUNTER — Other Ambulatory Visit: Payer: Self-pay | Admitting: Family Medicine

## 2018-05-16 VITALS — BP 100/70 | HR 77 | Temp 98.1°F | Resp 16 | Ht 62.0 in | Wt 139.9 lb

## 2018-05-16 DIAGNOSIS — K625 Hemorrhage of anus and rectum: Secondary | ICD-10-CM | POA: Diagnosis not present

## 2018-05-16 DIAGNOSIS — K649 Unspecified hemorrhoids: Secondary | ICD-10-CM

## 2018-05-16 LAB — HEMOGLOBIN AND HEMATOCRIT, BLOOD
HCT: 40.3 % (ref 35.0–45.0)
Hemoglobin: 13.9 g/dL (ref 11.7–15.5)

## 2018-05-16 LAB — HEMOCCULT GUIAC POC 1CARD (OFFICE): Fecal Occult Blood, POC: POSITIVE — AB

## 2018-05-16 MED ORDER — NITROGLYCERIN 0.4 % RE OINT
1.0000 | TOPICAL_OINTMENT | Freq: Two times a day (BID) | RECTAL | 0 refills | Status: DC
Start: 2018-05-16 — End: 2018-06-10

## 2018-05-16 MED ORDER — DOCUSATE SODIUM 100 MG PO CAPS: 100.0000 mg | ORAL_CAPSULE | Freq: Two times a day (BID) | ORAL | 0 refills | Status: DC

## 2018-05-16 MED ORDER — HYDROCORTISONE ACETATE 25 MG RE SUPP: 25.0000 mg | Freq: Two times a day (BID) | RECTAL | 0 refills | Status: DC

## 2018-05-16 NOTE — Patient Instructions (Addendum)
- Use preparation H wipes, use medicated suppositories ( Do not use laxative suppositories), use stool softner like colace and Nitro medicaitons- use glove when applying nitro cream twice a day.  - Sending referral to GI to discuss further options   Hemorrhoids Hemorrhoids are swollen veins in and around the rectum or anus. There are two types of hemorrhoids:  Internal hemorrhoids. These occur in the veins that are just inside the rectum. They may poke through to the outside and become irritated and painful.  External hemorrhoids. These occur in the veins that are outside of the anus and can be felt as a painful swelling or hard lump near the anus.  Most hemorrhoids do not cause serious problems, and they can be managed with home treatments such as diet and lifestyle changes. If home treatments do not help your symptoms, procedures can be done to shrink or remove the hemorrhoids. What are the causes? This condition is caused by increased pressure in the anal area. This pressure may result from various things, including:  Constipation.  Straining to have a bowel movement.  Diarrhea.  Pregnancy.  Obesity.  Sitting for long periods of time.  Heavy lifting or other activity that causes you to strain.  Anal sex.  What are the signs or symptoms? Symptoms of this condition include:  Pain.  Anal itching or irritation.  Rectal bleeding.  Leakage of stool (feces).  Anal swelling.  One or more lumps around the anus.  How is this diagnosed? This condition can often be diagnosed through a visual exam. Other exams or tests may also be done, such as:  Examination of the rectal area with a gloved hand (digital rectal exam).  Examination of the anal canal using a small tube (anoscope).  A blood test, if you have lost a significant amount of blood.  A test to look inside the colon (sigmoidoscopy or colonoscopy).  How is this treated? This condition can usually be treated at  home. However, various procedures may be done if dietary changes, lifestyle changes, and other home treatments do not help your symptoms. These procedures can help make the hemorrhoids smaller or remove them completely. Some of these procedures involve surgery, and others do not. Common procedures include:  Rubber band ligation. Rubber bands are placed at the base of the hemorrhoids to cut off the blood supply to them.  Sclerotherapy. Medicine is injected into the hemorrhoids to shrink them.  Infrared coagulation. A type of light energy is used to get rid of the hemorrhoids.  Hemorrhoidectomy surgery. The hemorrhoids are surgically removed, and the veins that supply them are tied off.  Stapled hemorrhoidopexy surgery. A circular stapling device is used to remove the hemorrhoids and use staples to cut off the blood supply to them.  Follow these instructions at home: Eating and drinking  Eat foods that have a lot of fiber in them, such as whole grains, beans, nuts, fruits, and vegetables. Ask your health care provider about taking products that have added fiber (fiber supplements).  Drink enough fluid to keep your urine clear or pale yellow. Managing pain and swelling  Take warm sitz baths for 20 minutes, 3-4 times a day to ease pain and discomfort.  If directed, apply ice to the affected area. Using ice packs between sitz baths may be helpful. ? Put ice in a plastic bag. ? Place a towel between your skin and the bag. ? Leave the ice on for 20 minutes, 2-3 times a day. General instructions  Take over-the-counter and prescription medicines only as told by your health care provider.  Use medicated creams or suppositories as told.  Exercise regularly.  Go to the bathroom when you have the urge to have a bowel movement. Do not wait.  Avoid straining to have bowel movements.  Keep the anal area dry and clean. Use wet toilet paper or moist towelettes after a bowel movement.  Do not  sit on the toilet for long periods of time. This increases blood pooling and pain. Contact a health care provider if:  You have increasing pain and swelling that are not controlled by treatment or medicine.  You have uncontrolled bleeding.  You have difficulty having a bowel movement, or you are unable to have a bowel movement.  You have pain or inflammation outside the area of the hemorrhoids. This information is not intended to replace advice given to you by your health care provider. Make sure you discuss any questions you have with your health care provider. Document Released: 10/05/2000 Document Revised: 03/07/2016 Document Reviewed: 06/22/2015 Elsevier Interactive Patient Education  [employer].

## 2018-05-16 NOTE — Progress Notes (Addendum)
Name: Helen Green   MRN: [id number]    DOB: 11/09/1965   Date:05/16/2018       Progress Note  Subjective  Chief Complaint  Chief Complaint  Patient presents with  . Rectal Bleeding    HPI  She complains of rectal bleeding 2 months on and off. She reports that she was constipated and when it finally broke loss. She had bleeding and pain. The constipation has resolved but she continues to have bleeding and pain. She has been dealing with these symptoms x 2 months. She is unsure if she has hemorrhoids. She treated symptoms with suppositories and Perparation H-cream with little improvement. Notices bright red blood when she wipes.  Colonoscopy was 09/2017 and had polyps removed no hemorrhoids noted then.   Patient Active Problem List   Diagnosis Date Noted  . Insomnia, persistent 05/03/2015  . Functional dyspepsia 05/03/2015  . Gastro-esophageal reflux disease without esophagitis 05/03/2015  . History of anemia 05/03/2015  . Perennial allergic rhinitis with seasonal variation 05/03/2015  . 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 Vitamin D deficiency 05/03/2015  . Irritable bowel syndrome with diarrhea 05/03/2015    Past Medical History:  Diagnosis Date  . Allergy   . Chronic abdominal pain   . Chronic insomnia   . Food allergy   . Functional dyspepsia   . GERD (gastroesophageal reflux disease)   . IBS (irritable bowel syndrome)   . Insomnia   . Irritable bowel syndrome with diarrhea   . Left arm weakness   . Neck pain   . Subacute maxillary sinusitis   . Vitamin A deficiency     Past Surgical History:  Procedure Laterality Date  . ABDOMINAL HYSTERECTOMY    . COLONOSCOPY WITH PROPOFOL N/A 09/27/2017   Procedure: COLONOSCOPY WITH PROPOFOL;  Surgeon: Jonathon Bellows, MD;  Location: [facility] ENDOSCOPY;  Service: Gastroenterology;  Laterality: N/A;  . ESOPHAGOGASTRODUODENOSCOPY (EGD) WITH PROPOFOL N/A 09/27/2017   Procedure: ESOPHAGOGASTRODUODENOSCOPY (EGD) WITH  PROPOFOL;  Surgeon: Jonathon Bellows, MD;  Location: [facility] ENDOSCOPY;  Service: Gastroenterology;  Laterality: N/A;  . TUBAL LIGATION      Social History   Tobacco Use  . Smoking status: Never Smoker  . Smokeless tobacco: Never Used  Substance Use Topics  . Alcohol use: No    Alcohol/week: 0.0 oz     Current Outpatient Medications:  .  albuterol (PROVENTIL HFA;VENTOLIN HFA) 108 (90 Base) MCG/ACT inhaler, Inhale 2 puffs into the lungs every 6 (six) hours as needed for wheezing or shortness of breath., Disp: 1 Inhaler, Rfl: 2 .  dicyclomine (BENTYL) 10 MG capsule, Take 1 capsule (10 mg total) by mouth 4 (four) times daily -  before meals and at bedtime., Disp: 90 capsule, Rfl: 0 .  fexofenadine (ALLEGRA ALLERGY) 180 MG tablet, Take 1 tablet (180 mg total) by mouth as needed., Disp: 30 tablet, Rfl: 5 .  fluticasone (FLONASE) 50 MCG/ACT nasal spray, instill 2 spray into each nostril at bedtime as needed, Disp: 16 g, Rfl: 2 .  hyoscyamine (LEVSIN, ANASPAZ) 0.125 MG tablet, TAKE 1 TABLET BY MOUTH EVERY 4 HOURS IF NEEDED, Disp: 30 tablet, Rfl: 0 .  omeprazole (PRILOSEC) 40 MG capsule, Take 1 capsule (40 mg total) by mouth daily., Disp: 30 capsule, Rfl: 5  Allergies  Allergen Reactions  . Apple Hives, Shortness Of Breath and Swelling  . Avocado Hives, Shortness Of Breath and Swelling    and raw fruit and vegetables  . Daucus Carota Hives, Shortness Of Breath and Swelling  .  Other Hives, Shortness Of Breath and Swelling    Pears, avacados, nuts    Review of Systems  Constitutional: Negative for chills, fever and malaise/fatigue.  Respiratory: Negative for shortness of breath.   Cardiovascular: Negative for chest pain and palpitations.  Gastrointestinal: Positive for abdominal pain (chronic), blood in stool, constipation and heartburn. Negative for nausea and vomiting.       Positive for rectal bleeding, pain and itching.  Musculoskeletal: Negative for back pain.  Neurological: Negative for  dizziness, weakness and headaches.    No other specific complaints in a complete review of systems (except as listed in HPI above).  Objective  Vitals:   05/16/18 1509  BP: 100/70  Pulse: 77  Resp: 16  Temp: 98.1 F (36.7 C)  TempSrc: Oral  SpO2: 98%  Weight: 139 lb 14.4 oz (63.5 kg)  Height: 5\' 2"  (1.575 m)    Body mass index is 25.59 kg/m.  Nursing Note and Vital Signs reviewed.  Physical Exam  Constitutional: She is oriented to person, place, and time. She appears well-developed and well-nourished.  Neck: Normal range of motion. Neck supple.  Cardiovascular: Normal rate and regular rhythm.  Pulmonary/Chest: Effort normal and breath sounds normal.  Abdominal: Soft. Bowel sounds are normal. There is no tenderness.  Genitourinary: Rectal exam shows guaiac positive stool.     Neurological: She is alert and oriented to person, place, and time.  Skin: Skin is warm and dry.  Psychiatric: She has a normal mood and affect. Her behavior is normal. Judgment and thought content normal.    No results found for this or any previous visit (from the past 48 hour(s)).  Assessment & Plan  1. Rectal bleeding - Ambulatory referral to Gastroenterology - Hemoglobin and hematocrit, blood - POCT occult blood stool- POSITIVE.   2. Hemorrhoids, unspecified hemorrhoid type Patient would like to discuss surgical options; refer to GI  - docusate sodium (COLACE) 100 MG capsule; Take 1 capsule (100 mg total) by mouth 2 (two) times daily.  Dispense: 10 capsule; Refill: 0 - hydrocortisone (ANUSOL-HC) 25 MG suppository; Place 1 suppository (25 mg total) rectally 2 (two) times daily.  Dispense: 12 suppository; Refill: 0 - Nitroglycerin 0.4 % OINT; Place 1 application rectally 2 (two) times daily. Pea sized amount twice a day  Dispense: 30 g; Refill: 0   -Red flags and when to present for emergency care or RTC including fever >101.3F, passing large clots or blood, lightheadedness,  palpitations, new/worsening/un-resolving symptoms,  reviewed with patient at time of visit. Follow up and care instructions discussed and provided in AVS.   ------------------------------------------ I have reviewed this encounter including the documentation in this note and/or discussed this patient with the provider, Suezanne Cheshire DNP AGNP-C. I am certifying that I agree with the content of this note as supervising physician. En[place] Derry, [facility] 05/20/2018, 5:49 PM

## 2018-06-09 ENCOUNTER — Encounter: Payer: Self-pay | Admitting: Family Medicine

## 2018-06-09 ENCOUNTER — Ambulatory Visit: Payer: 59 | Admitting: Family Medicine

## 2018-06-09 ENCOUNTER — Telehealth: Payer: Self-pay | Admitting: Family Medicine

## 2018-06-09 VITALS — BP 102/60 | HR 79 | Temp 98.3°F | Resp 12 | Ht 62.0 in | Wt 143.2 lb

## 2018-06-09 DIAGNOSIS — K582 Mixed irritable bowel syndrome: Secondary | ICD-10-CM | POA: Diagnosis not present

## 2018-06-09 DIAGNOSIS — K649 Unspecified hemorrhoids: Secondary | ICD-10-CM | POA: Diagnosis not present

## 2018-06-09 DIAGNOSIS — K625 Hemorrhage of anus and rectum: Secondary | ICD-10-CM

## 2018-06-09 NOTE — Telephone Encounter (Signed)
Copied from [place] [id number]. Topic: Quick Communication - Rx Refill/Question >> Jun 09, 2018  5:03 PM Oliver Pila B wrote: Pt called to ask about a medication that was suppose to be sent to her pharmacy after her visit today but no medication was listed on AVS for pickup; contact pt to advise

## 2018-06-09 NOTE — Progress Notes (Signed)
Name: Helen Green   MRN: [id number]    DOB: September 17, 1966   Date:06/09/2018       Progress Note  Subjective  Chief Complaint  Chief Complaint  Patient presents with  . Follow-up  . Rectal Bleeding    HPI  Rectal burning and bleeding: patient states symptoms started about 3 months ago after she strain to have a bowel movement. She has IBS with periods of diarrhea and constipation and had a forceful bowel movement prior to burning on rectum area and bleeding when she wiped. Marland Kitchen She was seen by Suezanne Cheshire, NP about one month ago  who gave her Anusol, nitroglycerin ointment and colace. She did not get NTG ointment filled because of cost. She states pain improve slightly but continues to be very painful/burning sensation and sometimes a sharp sensation inside her rectum. She already has a follow up with GI but it is not until September. Her colonoscopy is up to date. No weight loss, nausea or vomiting. Appetite is normal    Patient Active Problem List   Diagnosis Date Noted  . Insomnia, persistent 05/03/2015  . Functional dyspepsia 05/03/2015  . Gastro-esophageal reflux disease without esophagitis 05/03/2015  . History of anemia 05/03/2015  . Perennial allergic rhinitis with seasonal variation 05/03/2015  . 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 Vitamin D deficiency 05/03/2015  . Irritable bowel syndrome with diarrhea 05/03/2015    Past Surgical History:  Procedure Laterality Date  . ABDOMINAL HYSTERECTOMY    . COLONOSCOPY WITH PROPOFOL N/A 09/27/2017   Procedure: COLONOSCOPY WITH PROPOFOL;  Surgeon: Jonathon Bellows, MD;  Location: [facility] ENDOSCOPY;  Service: Gastroenterology;  Laterality: N/A;  . ESOPHAGOGASTRODUODENOSCOPY (EGD) WITH PROPOFOL N/A 09/27/2017   Procedure: ESOPHAGOGASTRODUODENOSCOPY (EGD) WITH PROPOFOL;  Surgeon: Jonathon Bellows, MD;  Location: [facility] ENDOSCOPY;  Service: Gastroenterology;  Laterality: N/A;  . TUBAL LIGATION      Family History  Problem  Relation Age of Onset  . Diabetes Mother   . Hyperlipidemia Mother   . Hypertension Mother   . Liver disease Father   . Cancer Brother 50       brain cancer  . Breast cancer Neg Hx     Social History   Socioeconomic History  . Marital status: Married    Spouse name: [name]  . Number of children: 2  . Years of education: Not on file  . Highest education level: Associate degree: occupational, Hotel manager, or vocational program  Occupational History  . 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 Financial resource strain: Somewhat hard  . Food insecurity:    Worry: Never true    Inability: Never true  . Transportation needs:    Medical: No    Non-medical: No  Tobacco Use  . Smoking status: Never Smoker  . Smokeless tobacco: Never Used  Substance and Sexual Activity  . Alcohol use: No    Alcohol/week: 0.0 standard drinks  . Drug use: No  . Sexual activity: Yes  Lifestyle  . Physical activity:    Days per week: 7 days    Minutes per session: 30 min  . Stress: To some extent  Relationships  . Social connections:    Talks on phone: More than three times a week    Gets together: More than three times a week    Attends religious service: More than 4 times per year    Active member of club or organization: Yes    Attends meetings  of clubs or organizations: More than 4 times per year    Relationship status: Married  . Intimate partner violence:    Fear of current or ex partner: No    Emotionally abused: No    Physically abused: No    Forced sexual activity: No  Other Topics Concern  . Not on file  Social History Narrative   On her second marriage, helping raise her husband's 62 yo niece.      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  albuterol (PROVENTIL HFA;VENTOLIN HFA) 108 (90 Base) MCG/ACT inhaler, Inhale 2 puffs into the lungs every 6 (six) hours as needed for wheezing or  shortness of breath., Disp: 1 Inhaler, Rfl: 2 .  dicyclomine (BENTYL) 10 MG capsule, Take 1 capsule (10 mg total) by mouth 4 (four) times daily -  before meals and at bedtime., Disp: 90 capsule, Rfl: 0 .  docusate sodium (COLACE) 100 MG capsule, Take 1 capsule (100 mg total) by mouth 2 (two) times daily., Disp: 10 capsule, Rfl: 0 .  fexofenadine (ALLEGRA ALLERGY) 180 MG tablet, Take 1 tablet (180 mg total) by mouth as needed., Disp: 30 tablet, Rfl: 5 .  fluticasone (FLONASE) 50 MCG/ACT nasal spray, instill 2 spray into each nostril at bedtime as needed, Disp: 16 g, Rfl: 2 .  hydrocortisone (ANUSOL-HC) 25 MG suppository, Place 1 suppository (25 mg total) rectally 2 (two) times daily., Disp: 12 suppository, Rfl: 0 .  hyoscyamine (LEVSIN, ANASPAZ) 0.125 MG tablet, TAKE 1 TABLET BY MOUTH EVERY 4 HOURS IF NEEDED, Disp: 30 tablet, Rfl: 0 .  Nitroglycerin 0.4 % OINT, Place 1 application rectally 2 (two) times daily. Pea sized amount twice a day, Disp: 30 g, Rfl: 0 .  omeprazole (PRILOSEC) 40 MG capsule, Take 1 capsule (40 mg total) by mouth daily., Disp: 30 capsule, Rfl: 5  Allergies  Allergen Reactions  . Apple Hives, Shortness Of Breath and Swelling  . Avocado Hives, Shortness Of Breath and Swelling    and raw fruit and vegetables  . Daucus Carota Hives, Shortness Of Breath and Swelling  . Other Hives, Shortness Of Breath and Swelling    Pears, avacados, nuts     ROS  Constitutional: Negative for fever or weight change.  Respiratory: Negative for cough and shortness of breath.   Cardiovascular: Negative for chest pain or palpitations.  Gastrointestinal: Negative for abdominal pain, no bowel changes.  Musculoskeletal: Negative for gait problem or joint swelling.  Skin: Negative for rash.  Neurological: Negative for dizziness or headache.  No other specific complaints in a complete review of systems (except as listed in HPI above).  Objective  Vitals:   06/09/18 1610  BP: 102/60  Pulse:  79  Resp: 12  Temp: 98.3 F (36.8 C)  TempSrc: Oral  SpO2: 97%  Weight: 143 lb 3.2 oz (65 kg)  Height: 5\' 2"  (1.575 m)    Body mass index is 26.19 kg/m.  Physical Exam  Constitutional: Patient appears well-developed and well-nourished.  No distress.  HEENT: head atraumatic, normocephalic, pupils equal and reactive to light,  neck supple, throat within normal limits Cardiovascular: Normal rate, regular rhythm and normal heart sounds.  No murmur heard. No BLE edema. Pulmonary/Chest: Effort normal and breath sounds normal. No respiratory distress. Abdominal: Soft.  There is no tenderness.Not done today per patient request . She refused to have rectal exam yesterday since she will see GI  Psychiatric: Patient has a normal mood and affect. behavior is normal. Judgment and thought content normal.  Recent Results (from the past 2160 hour(s))  Hemoglobin and hematocrit, blood     Status: None   Collection Time: 05/16/18  4:21 PM  Result Value Ref Range   Hemoglobin 13.9 11.7 - 15.5 g/dL   HCT 40.3 35.0 - 45.0 %  POCT occult blood stool     Status: Abnormal   Collection Time: 05/16/18  5:19 PM  Result Value Ref Range   Fecal Occult Blood, POC Positive (A) Negative   Card #1 Date     Card #2 Fecal Occult Blod, POC     Card #2 Date     Card #3 Fecal Occult Blood, POC     Card #3 Date       PHQ2/9: Depression screen [facility] 2/9 06/09/2018 03/07/2018 07/22/2017 10/11/2016 08/03/2016  Decreased Interest 0 0 0 0 0  Down, Depressed, Hopeless 0 0 0 0 0  PHQ - 2 Score 0 0 0 0 0  Altered sleeping 0 - - - -  Tired, decreased energy 0 - - - -  Change in appetite 0 - - - -  Feeling bad or failure about yourself  0 - - - -  Trouble concentrating 0 - - - -  Moving slowly or fidgety/restless 0 - - - -  Suicidal thoughts 0 - - - -  PHQ-9 Score 0 - - - -  Difficult doing work/chores Not difficult at all - - - -     Fall Risk: Fall Risk  06/09/2018 05/16/2018 03/07/2018 07/22/2017 10/11/2016   Falls in the past year? No No No No No     Functional Status Survey: Is the patient deaf or have difficulty hearing?: No Does the patient have difficulty seeing, even when wearing glasses/contacts?: No Does the patient have difficulty concentrating, remembering, or making decisions?: No Does the patient have difficulty walking or climbing stairs?: No Does the patient have difficulty dressing or bathing?: No Does the patient have difficulty doing errands alone such as visiting a doctor's office or shopping?: No    Assessment & Plan  1. Hemorrhoids, unspecified hemorrhoid type  Possible fissure   2. Rectal bleeding  Doing better, still bleeds, never got rx of nitroglycerine because of cost, but based on her symptoms she likely has a fissure and advised her to try medication for 2 months or until she sees GI. She asked me not to do a rectal exam on her today . She will cal me if she needs another rx  3. Irritable bowel syndrome with both constipation and diarrhea

## 2018-06-10 ENCOUNTER — Other Ambulatory Visit: Payer: Self-pay | Admitting: Family Medicine

## 2018-06-10 DIAGNOSIS — K649 Unspecified hemorrhoids: Secondary | ICD-10-CM

## 2018-06-10 MED ORDER — NITROGLYCERIN 0.4 % RE OINT: 1.0000 "application " | TOPICAL_OINTMENT | Freq: Two times a day (BID) | RECTAL | 0 refills | Status: DC

## 2018-06-10 NOTE — Telephone Encounter (Signed)
Left message regarding medication Dr. Ancil Boozer sent in for the patient to her pharmacy-[facility].

## 2018-06-10 NOTE — Telephone Encounter (Signed)
Re-sent it today. Nitroglycerin ointment

## 2018-07-10 ENCOUNTER — Encounter: Payer: Self-pay | Admitting: Emergency Medicine

## 2018-07-10 ENCOUNTER — Other Ambulatory Visit: Payer: Self-pay

## 2018-07-10 ENCOUNTER — Emergency Department
Admission: EM | Admit: 2018-07-10 | Discharge: 2018-07-10 | Disposition: A | Discharge status: Home / Self Care | Payer: 59 | Attending: Emergency Medicine | Admitting: Emergency Medicine

## 2018-07-10 DIAGNOSIS — Z79899 Other long term (current) drug therapy: Secondary | ICD-10-CM | POA: Insufficient documentation

## 2018-07-10 DIAGNOSIS — M5442 Lumbago with sciatica, left side: Secondary | ICD-10-CM | POA: Diagnosis not present

## 2018-07-10 DIAGNOSIS — M545 Low back pain: Secondary | ICD-10-CM | POA: Diagnosis present

## 2018-07-10 DIAGNOSIS — M5441 Lumbago with sciatica, right side: Secondary | ICD-10-CM | POA: Insufficient documentation

## 2018-07-10 LAB — URINALYSIS, COMPLETE (UACMP) WITH MICROSCOPIC
Bacteria, UA: NONE SEEN
Bilirubin Urine: NEGATIVE
Glucose, UA: NEGATIVE mg/dL
Ketones, ur: NEGATIVE mg/dL
Leukocytes, UA: NEGATIVE
Nitrite: NEGATIVE
Protein, ur: NEGATIVE mg/dL
Specific Gravity, Urine: 1.003 — ABNORMAL LOW (ref 1.005–1.030)
pH: 6 (ref 5.0–8.0)

## 2018-07-10 MED ORDER — ONDANSETRON HCL 4 MG PO TABS: 4.0000 mg | ORAL_TABLET | Freq: Three times a day (TID) | ORAL | 1 refills | Status: AC | PRN

## 2018-07-10 MED ORDER — KETOROLAC TROMETHAMINE 10 MG PO TABS: 10.0000 mg | ORAL_TABLET | Freq: Four times a day (QID) | ORAL | 0 refills | Status: AC | PRN

## 2018-07-10 MED ORDER — KETOROLAC TROMETHAMINE 30 MG/ML IJ SOLN
30.0000 mg | Freq: Once | INTRAMUSCULAR | Status: AC
  Administered 2018-07-10: 30 mg via INTRAMUSCULAR
  Filled 2018-07-10: qty 1

## 2018-07-10 MED ORDER — METHOCARBAMOL 500 MG PO TABS
500.0000 mg | ORAL_TABLET | Freq: Once | ORAL | Status: AC
  Administered 2018-07-10: 500 mg via ORAL
  Filled 2018-07-10: qty 1

## 2018-07-10 MED ORDER — CYCLOBENZAPRINE HCL 5 MG PO TABS: 5.0000 mg | ORAL_TABLET | Freq: Three times a day (TID) | ORAL | 0 refills | Status: AC | PRN

## 2018-07-10 MED ORDER — ONDANSETRON 4 MG PO TBDP
4.0000 mg | ORAL_TABLET | Freq: Once | ORAL | Status: AC
  Administered 2018-07-10: 4 mg via ORAL
  Filled 2018-07-10: qty 1

## 2018-07-10 NOTE — ED Provider Notes (Signed)
[facility] Emergency Department Provider Note  ____________________________________________  Time seen: Approximately 6:44 PM  I have reviewed the triage vital signs and the nursing notes.   HISTORY  Chief Complaint Back Pain    HPI Helen Green is a 52 y.o. female presents to the emergency department with 8 out of 10 acute low back pain that radiated to the bilateral lower legs that started approximately 7 AM this morning.  Patient reports that she was moving things around her suitcase when symptoms started.  Patient reports that she felt exquisite pain and then radiation of pain down the back of her legs.  Patient reports that she experiences radiating pain bilaterally.  She has been able to ambulate and has not noticed any weakness.  She has never had any back pain in the past.  She does report a sensation of incomplete voiding and some mild dysuria today.  Patient reports that she has been traveling and has not been staying hydrated.  No falls or instances of trauma.  No bowel or bladder incontinence or saddle anesthesia.  No alleviating measures of been attempted.   Past Medical History:  Diagnosis Date  . Allergy   . Chronic abdominal pain   . Chronic insomnia   . Food allergy   . Functional dyspepsia   . GERD (gastroesophageal reflux disease)   . IBS (irritable bowel syndrome)   . Insomnia   . Irritable bowel syndrome with diarrhea   . Left arm weakness   . Neck pain   . Subacute maxillary sinusitis   . Vitamin A deficiency     Patient Active Problem List   Diagnosis Date Noted  . Insomnia, persistent 05/03/2015  . Functional dyspepsia 05/03/2015  . Gastro-esophageal reflux disease without esophagitis 05/03/2015  . History of anemia 05/03/2015  . Perennial allergic rhinitis with seasonal variation 05/03/2015  . 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 Vitamin D deficiency 05/03/2015  . Irritable bowel syndrome with diarrhea  05/03/2015    Past Surgical History:  Procedure Laterality Date  . ABDOMINAL HYSTERECTOMY    . COLONOSCOPY WITH PROPOFOL N/A 09/27/2017   Procedure: COLONOSCOPY WITH PROPOFOL;  Surgeon: Jonathon Bellows, MD;  Location: [facility] ENDOSCOPY;  Service: Gastroenterology;  Laterality: N/A;  . ESOPHAGOGASTRODUODENOSCOPY (EGD) WITH PROPOFOL N/A 09/27/2017   Procedure: ESOPHAGOGASTRODUODENOSCOPY (EGD) WITH PROPOFOL;  Surgeon: Jonathon Bellows, MD;  Location: [facility] ENDOSCOPY;  Service: Gastroenterology;  Laterality: N/A;  . TUBAL LIGATION      Prior to Admission medications   Medication Sig Start Date End Date Taking? Authorizing Provider  albuterol (PROVENTIL HFA;VENTOLIN HFA) 108 (90 Base) MCG/ACT inhaler Inhale 2 puffs into the lungs every 6 (six) hours as needed for wheezing or shortness of breath. 03/26/16   Plonk, Gwyndolyn Saxon, MD  cyclobenzaprine (FLEXERIL) 5 MG tablet Take 1 tablet (5 mg total) by mouth 3 (three) times daily as needed for up to 3 days for muscle spasms. 07/10/18 07/13/18  Lannie Fields, PA-C  dicyclomine (BENTYL) 10 MG capsule Take 1 capsule (10 mg total) by mouth 4 (four) times daily -  before meals and at bedtime. 07/22/17   Steele Sizer, MD  docusate sodium (COLACE) 100 MG capsule Take 1 capsule (100 mg total) by mouth 2 (two) times daily. 05/16/18   Poulose, Bethel Born, NP  fexofenadine (ALLEGRA ALLERGY) 180 MG tablet Take 1 tablet (180 mg total) by mouth as needed. 07/12/15   Steele Sizer, MD  fluticasone (FLONASE) 50 MCG/ACT nasal spray instill 2 spray into  each nostril at bedtime as needed 02/11/18   Steele Sizer, MD  hydrocortisone (ANUSOL-HC) 25 MG suppository Place 1 suppository (25 mg total) rectally 2 (two) times daily. 05/16/18   Poulose, Bethel Born, NP  hyoscyamine (LEVSIN, ANASPAZ) 0.125 MG tablet TAKE 1 TABLET BY MOUTH EVERY 4 HOURS IF NEEDED 04/30/18   Ancil Boozer, Drue Stager, MD  ketorolac (TORADOL) 10 MG tablet Take 1 tablet (10 mg total) by mouth every 6 (six) hours as needed for up to  5 days. 07/10/18 07/15/18  Lannie Fields, PA-C  Nitroglycerin 0.4 % OINT Place 1 application rectally 2 (two) times daily. Pea sized amount twice a day 06/10/18   Steele Sizer, MD  omeprazole (PRILOSEC) 40 MG capsule Take 1 capsule (40 mg total) by mouth daily. 10/25/17   Jonathon Bellows, MD  ondansetron (ZOFRAN) 4 MG tablet Take 1 tablet (4 mg total) by mouth every 8 (eight) hours as needed for up to 3 days for nausea or vomiting. 07/10/18 07/13/18  Lannie Fields, PA-C    Allergies Apple; Avocado; Daucus carota; and Other  Family History  Problem Relation Age of Onset  . Diabetes Mother   . Hyperlipidemia Mother   . Hypertension Mother   . Liver disease Father   . Cancer Brother 57       brain cancer  . Breast cancer Neg Hx     Social History Social History   Tobacco Use  . Smoking status: Never Smoker  . Smokeless tobacco: Never Used  Substance Use Topics  . Alcohol use: No    Alcohol/week: 0.0 standard drinks  . Drug use: No     Review of Systems  Constitutional: No fever/chills Eyes: No visual changes. No discharge ENT: No upper respiratory complaints. Cardiovascular: no chest pain. Respiratory: no cough. No SOB. Gastrointestinal: No abdominal pain.  No nausea, no vomiting.  No diarrhea.  No constipation. Genitourinary: Patient has dysuria. No hematuria Musculoskeletal: Patient has low back pain.  Skin: Negative for rash, abrasions, lacerations, ecchymosis. Neurological: Negative for headaches, focal weakness or numbness.   ______________________________________   PHYSICAL EXAM:  VITAL SIGNS: ED Triage Vitals  Enc Vitals Group     BP 07/10/18 1741 120/74     Pulse Rate 07/10/18 1741 80     Resp 07/10/18 1741 18     Temp 07/10/18 1741 98.3 F (36.8 C)     Temp Source 07/10/18 1741 Oral     SpO2 07/10/18 1741 100 %     Weight 07/10/18 1741 141 lb (64 kg)     Height 07/10/18 1741 5\' 3"  (1.6 m)     Head Circumference --      Peak Flow --      Pain Score  07/10/18 1743 6     Pain Loc --      Pain Edu? --      Excl. in [place]? --      Constitutional: Alert and oriented. Well appearing and in no acute distress. Eyes: Conjunctivae are normal. PERRL. EOMI. Head: Atraumatic. Cardiovascular: Normal rate, regular rhythm. Normal S1 and S2.  Good peripheral circulation. Respiratory: Normal respiratory effort without tachypnea or retractions. Lungs CTAB. Good air entry to the bases with no decreased or absent breath sounds. Gastrointestinal: Bowel sounds 4 quadrants. Soft and nontender to palpation. No guarding or rigidity. No palpable masses. No distention. No CVA tenderness. Musculoskeletal: Full range of motion to all extremities. No gross deformities appreciated.  Positive straight leg raise bilaterally.  Patient has paraspinal muscle  tenderness along the lumbar spine. Neurologic:  Normal speech and language. No gross focal neurologic deficits are appreciated.  Skin:  Skin is warm, dry and intact. No rash noted. Psychiatric: Mood and affect are normal. Speech and behavior are normal. Patient exhibits appropriate insight and judgement.   ____________________________________________   LABS (all labs ordered are listed, but only abnormal results are displayed)  Labs Reviewed  URINALYSIS, COMPLETE (UACMP) WITH MICROSCOPIC - Abnormal; Notable for the following components:      Result Value   Color, Urine COLORLESS (*)    APPearance CLEAR (*)    Specific Gravity, Urine 1.003 (*)    Hgb urine dipstick SMALL (*)    All other components within normal limits   ____________________________________________  EKG   ____________________________________________  RADIOLOGY   No results found.  ____________________________________________    PROCEDURES  Procedure(s) performed:    Procedures    Medications  ketorolac (TORADOL) 30 MG/ML injection 30 mg (30 mg Intramuscular Given 07/10/18 1824)  methocarbamol (ROBAXIN) tablet 500 mg (500  mg Oral Given 07/10/18 1825)  ondansetron (ZOFRAN-ODT) disintegrating tablet 4 mg (4 mg Oral Given 07/10/18 1916)     ____________________________________________   INITIAL IMPRESSION / ASSESSMENT AND PLAN / ED COURSE  Pertinent labs & imaging results that were available during my care of the patient were reviewed by me and considered in my medical decision making (see chart for details).  Review of the [place] CSRS was performed in accordance of the [place] prior to dispensing any controlled drugs.      Assessment and plan:  Back pain:  Patient presents to the emergency department with acute onset of low back pain started today.  History and physical exam findings are consistent with a lumbar strain.  Patient's pain improved with Toradol and Robaxin in the emergency department.  Urinalysis was noncontributory for cystitis.  Patient was discharged with Toradol and Flexeril.  Vital signs are reassuring prior to discharge.  All patient questions were answered.   ____________________________________________  FINAL CLINICAL IMPRESSION(S) / ED DIAGNOSES  Final diagnoses:  Acute bilateral low back pain with bilateral sciatica      NEW MEDICATIONS STARTED DURING THIS VISIT:  ED Discharge Orders         Ordered    ketorolac (TORADOL) 10 MG tablet  Every 6 hours PRN     07/10/18 1916    cyclobenzaprine (FLEXERIL) 5 MG tablet  3 times daily PRN     07/10/18 1916    ondansetron (ZOFRAN) 4 MG tablet  Every 8 hours PRN     07/10/18 1916              This chart was dictated using voice recognition software/Dragon. Despite best efforts to proofread, errors can occur which can change the meaning. Any change was purely unintentional.    Karren Cobble 07/10/18 2113    Lisa Roca, MD 07/14/18 1019

## 2018-07-10 NOTE — ED Notes (Signed)
Pt presents to ED with c/c of acute low back pain beginning this morning when Pt was packing her luggage in [place]. She reports sudden stabbing pain in her low back that radiated to her legs. She has been unable to support weight to ambulate since the episode this morning. Denies any urinary/bowel changes in frequency/urgency. No previous Hx of similar low back Sx. Pt self medicated at approx 1000 this morning with OTC ibuprofen 400mg  with little reported relief.

## 2018-07-10 NOTE — ED Triage Notes (Signed)
Patient to ER for c/o pain to lower back pain with shooting pain down legs. Patient denies any injury or heavy lifting.

## 2018-07-11 ENCOUNTER — Ambulatory Visit: Payer: 59 | Admitting: Gastroenterology

## 2018-07-11 ENCOUNTER — Encounter: Payer: Self-pay | Admitting: Gastroenterology

## 2018-07-11 VITALS — BP 102/63 | HR 76 | Resp 17 | Ht 62.0 in | Wt 141.4 lb

## 2018-07-11 DIAGNOSIS — K581 Irritable bowel syndrome with constipation: Secondary | ICD-10-CM

## 2018-07-11 DIAGNOSIS — K589 Irritable bowel syndrome without diarrhea: Secondary | ICD-10-CM | POA: Diagnosis not present

## 2018-07-11 DIAGNOSIS — K219 Gastro-esophageal reflux disease without esophagitis: Secondary | ICD-10-CM | POA: Diagnosis not present

## 2018-07-11 MED ORDER — HYOSCYAMINE SULFATE ER 0.375 MG PO TBCR: 0.3750 mg | EXTENDED_RELEASE_TABLET | Freq: Two times a day (BID) | ORAL | 1 refills | Status: DC

## 2018-07-11 NOTE — Progress Notes (Signed)
Cephas Darby, MD [address]  [place]  [place], [place] [address]  Main: [phone]  Fax: [phone] Pager: [phone]   Primary Care Physician: Steele Sizer, MD  Primary Gastroenterologist:  Dr. Cephas Darby  Chief Complaint  Patient presents with  . Follow-up    from colonoscopy with Dr. Vicente Males 6 months ago  . Rectal Bleeding  . Hemorrhoids    HPI: Helen Green is a 52 y.o. female, Here for follow-up of chronic left-sided abdominal pain. Patient was previously seen by Dr. Vicente Males, underwent imaging and endoscopy to evaluation for chronic abdominal pain which were unremarkable. She continues to have chronic left-sided abdominal pain associated with bloating. She continues to have constipation, straining and hard stools. She reports that her abdominal pain occurs mostly at night interfering with her sleep, with significant gas and burping. She has been taking hyoscyamine as needed which provides some relief. Bentyl has not helped. She switched from omeprazole to Pepcid for heartburn as omeprazole did not help.  She was sent to me to discuss about rectal bleeding and possible hemorrhoid ligation has her colonoscopy was normal. She was seen by Dr. Ancil Boozer about a month ago due to three-months history of severe rectal burning/sharp pain and bleeding that resulted after straining to have a bowel movement. She was prescribed Anusol and nitroglycerin ointment along with Colace. She reports that the nitroglycerin ointment significantly helped and her symptoms resolved.  She went to [facility] ER yesterday due to sudden onset of severe low back pain. She was returning from [facility] yesterday by flight, suddenly experienced low back pain in the airport and dropped down to the floor. She went to ER right away. She was seen by PA, the symptoms were attributed to lumbar strain and patient's pain improved with Toradol and Robaxin in the ER. She was  discharged home on Toradol, Flexeril. She is accompanied by her husband to the office today and walking uncomfortable due to ongoing low back pain.  Her main concern today is ongoing left-sided abdominal pain, bloating and heartburn  Current Outpatient Medications:  .  albuterol (PROVENTIL HFA;VENTOLIN HFA) 108 (90 Base) MCG/ACT inhaler, Inhale 2 puffs into the lungs every 6 (six) hours as needed for wheezing or shortness of breath., Disp: 1 Inhaler, Rfl: 2 .  cyclobenzaprine (FLEXERIL) 5 MG tablet, Take 1 tablet (5 mg total) by mouth 3 (three) times daily as needed for up to 3 days for muscle spasms., Disp: 10 tablet, Rfl: 0 .  fexofenadine (ALLEGRA ALLERGY) 180 MG tablet, Take 1 tablet (180 mg total) by mouth as needed., Disp: 30 tablet, Rfl: 5 .  fluticasone (FLONASE) 50 MCG/ACT nasal spray, instill 2 spray into each nostril at bedtime as needed, Disp: 16 g, Rfl: 2 .  hyoscyamine (LEVSIN, ANASPAZ) 0.125 MG tablet, TAKE 1 TABLET BY MOUTH EVERY 4 HOURS IF NEEDED, Disp: 30 tablet, Rfl: 0 .  ketorolac (TORADOL) 10 MG tablet, Take 1 tablet (10 mg total) by mouth every 6 (six) hours as needed for up to 5 days., Disp: 20 tablet, Rfl: 0 .  omeprazole (PRILOSEC) 40 MG capsule, Take 1 capsule (40 mg total) by mouth daily., Disp: 30 capsule, Rfl: 5 .  ondansetron (ZOFRAN) 4 MG tablet, Take 1 tablet (4 mg total) by mouth every 8 (eight) hours as needed for up to 3 days for nausea or vomiting., Disp: 10 tablet, Rfl: 1 .  Hyoscyamine Sulfate 0.375 MG TBCR, Take 1 tablet (0.375 mg total) by  mouth 2 (two) times daily., Disp: 60 tablet, Rfl: 1   Allergies as of 07/11/2018 - Review Complete 07/11/2018  Allergen Reaction Noted  . Apple Hives, Shortness Of Breath, and Swelling 05/03/2015  . Avocado Hives, Shortness Of Breath, and Swelling 05/03/2015  . Daucus carota Hives, Shortness Of Breath, and Swelling 07/30/2014  . Other Hives, Shortness Of Breath, and Swelling 05/03/2015    NSAIDs:  none  Antiplts/Anticoagulants/Anti thrombotics: none  GI procedures:  EGD and colonoscopy by Dr. Vicente Males 09/27/2017  - Normal examined duodenum. - Normal esophagus. - Normal stomach. Biopsied.  - One 3 mm polyp in the transverse colon, removed with a cold biopsy forceps. Resected and retrieved. - The examination was otherwise normal on direct and retroflexion views.  DIAGNOSIS:  A. STOMACH; COLD BIOPSY:  - ANTRAL MUCOSA WITH MILD CHRONIC GASTRITIS.  - NEGATIVE FOR H. PYLORI, DYSPLASIA AND MALIGNANCY.   B. COLON POLYP, TRANSVERSE; COLD BIOPSY:  - TUBULAR ADENOMA.  - NEGATIVE FOR HIGH-GRADE DYSPLASIA AND MALIGNANCY.   ROS:  General: Negative for anorexia, weight loss, fever, chills, fatigue, weakness. ENT: Negative for hoarseness, difficulty swallowing , nasal congestion. CV: Negative for chest pain, angina, palpitations, dyspnea on exertion, peripheral edema.  Respiratory: Negative for dyspnea at rest, dyspnea on exertion, cough, sputum, wheezing.  GI: See history of present illness. GU:  Negative for dysuria, hematuria, urinary incontinence, urinary frequency, nocturnal urination.  Endo: Negative for unusual weight change.    Physical Examination:   BP 102/63 (BP Location: Left Arm, Patient Position: Sitting, Cuff Size: Normal)   Pulse 76   Resp 17   Ht 5\' 2"  (1.575 m)   Wt 141 lb 6.4 oz (64.1 kg)   BMI 25.86 kg/m   General: Well-nourished, well-developed in no acute distress.  Eyes: No icterus. Conjunctivae pink. Mouth: Oropharyngeal mucosa moist and pink , no lesions erythema or exudate. Lungs: Clear to auscultation bilaterally. Non-labored. Heart: Regular rate and rhythm, no murmurs rubs or gallops.  Abdomen: Bowel sounds are normal, left mid quadrant tenderness, moderately distended, tympanitic, no hepatosplenomegaly or masses, no hernia , no rebound or guarding.   Extremities: No lower extremity edema. No clubbing or deformities. Neuro: Alert and oriented x 3.   Grossly intact. Skin: Warm and dry, no jaundice.   Psych: Alert and cooperative, normal mood and affect.   Imaging Studies: reviewed  Assessment and Plan:   Helen Green is a 52 y.o. Hispanic female chronic left-sided abdominal pain associated with bloating and constipation consistent with constipation predominant IBS.   IBS-C: Colonoscopy and CT A/P unremarkable Discussed with her about high-fiber diet, fiber supplements Start low-dose linaclotide 72 MCG daily if above does not work Start hyoscyamine 0.375 mg twice daily for abdominal pain  GERD: EGD unremarkable She thinks Pepcid is working better than Prilosec Continue Pepcid for now  Probable anal fissure/symptomatic hemorrhoids: Relieved with topical nitroglycerin and Anusol suppository Defer hemorrhoid ligation at this time  tubular adenoma colon: Surveillance colonoscopy in 09/2022  Follow up in 1 month   Dr [name], MD

## 2018-07-11 NOTE — Patient Instructions (Signed)
Dieta rica en fibra High-Fiber Diet La fibra, tambin llamada fibra dietaria, es un tipo de carbohidrato que se encuentra en las frutas, las verduras, los cereales integrales y los frijoles. Una dieta rica en fibra puede tener muchos beneficios para la salud. El mdico puede recomendar una dieta rica en fibra para ayudar a:  Contractor. La fibra puede hacer que defeque con ms frecuencia.  Disminuir el nivel de colesterol.  [place] hemorroides, la diverticulosis no complicada o el sndrome de colon irritable.  Evitar comer en exceso como parte de un plan para bajar de peso.  Evitar la enfermedad cardaca, la diabetes tipo 2 y ciertos cnceres.  En qu consiste el plan? El consumo diario recomendado de fibra incluye lo siguiente:  38gramos para los hombres menores de 67 aos.  30gramos para los hombres mayores de 50 aos.  25gramos para las mujeres menores de 50 aos.  21gramos para las [employer] de 50 aos.  Puede lograr el consumo diario recomendado de fibra si come una variedad de frutas, verduras, cereales y frijoles. El mdico tambin puede recomendar un suplemento de fibra si no es posible obtener suficiente fibra a travs de la dieta. Qu debo saber acerca de la dieta rica en fibra?  La eficacia de los suplementos de [place] no ha sido estudiada ampliamente, de modo que es mejor obtener fibra directamente de los alimentos.  Verifique siempre el contenido de fibra en la etiqueta de informacin nutricional de los alimentos preenvasados. Busque alimentos que contengan al menos 5gramos de fibra por porcin.  Consulte a un nutricionista si tiene preguntas sobre algunos alimentos especficos relacionados con su enfermedad, especialmente si estos alimentos no se mencionan a continuacin.  Aumente el consumo diario de fibra en forma gradual. Aumentar demasiado rpido el consumo de fibra dietaria puede provocar distensin abdominal, clicos o gases.  Beber  abundante agua. El [place] a Economist. Qu alimentos puedo comer? Cereales Panes integrales. Cereal multigrano. Avena. Arroz integral. Dwyane Luo. Trigo burgol. Mijo. Magdalenas de salvado. Palomitas de maz. Galletas de centeno. Verduras Batatas. [place]spinaca. Col rizada. [place]ofas. Repollo. Brcoli. Guisantes. Zanahorias. [place]. Lambert Mody Bayas. Peras. Manzanas. Naranjas. Aguacates. Ciruelas y pasas. Higos secos. Carnes y otras fuentes de protenas Frijoles blancos, colorados, pintos y porotos de soja. Guisantes secos. Lentejas. Frutos secos y semillas. Lcteos Yogur fortificado con Pharmacist, hospital. Bebidas Leche de soja fortificada con Fredderick Phenix. Jugo de naranja fortificado con Fredderick Phenix. Otros Barras de [place]. Es posible que los productos que se enumeran ms arriba no sean una lista completa de las bebidas o los alimentos recomendados. Comunquese con el nutricionista para conocer ms opciones. Qu alimentos no se recomiendan? Cereales Pan blanco. Pastas hechas con Letitia Neri. Arroz blanco. Verduras Papas fritas. Verduras enlatadas. Verduras muy cocidas. Frutas Jugo de frutas. Frutas cocidas coladas. Carnes y otras fuentes de protenas Cortes de carne con alto contenido de Lobbyist. Aves o pescados fritos. Lcteos Leche. Yogur. Queso crema. [employer]. Bebidas Gaseosas. Otros Tortas y pasteles. [place] y aceites. Es posible que los productos que se enumeran ms arriba no sean una lista completa de los alimentos y las bebidas que se Higher education careers adviser. Comunquese con el nutricionista para obtener ms informacin. Cules son algunos consejos para incluir alimentos ricos en fibra en la dieta?  Consuma una gran variedad de alimentos ricos en fibra.  Asegrese de que la mitad de todos los cereales consumidos cada da sean cereales integrales.  Reemplace los panes y cereales hechos de harina refinada o harina blanca por panes  y cereales integrales.  Reemplace el arroz blanco por arroz  integral, trigo burgol o mijo.  Comience Games developer con un desayuno rico en [place], como un cereal que contenga al menos 5gramos de fibra por porcin.  Use guisantes en lugar de carne en las sopas, ensaladas o pastas.  Coma refrigerios ricos en fibra, como frutos rojos, verduras crudas, frutos secos o palomitas de maz. Esta informacin no tiene Marine scientist el consejo del mdico. Asegrese de hacerle al mdico cualquier pregunta que tenga. Document Released: 10/08/2005 Document Revised: 02/13/2017 Document Reviewed: 03/23/2014 Elsevier Interactive Patient Education  [employer].

## 2018-07-15 ENCOUNTER — Ambulatory Visit: Payer: 59 | Admitting: Family Medicine

## 2018-07-15 ENCOUNTER — Encounter: Payer: Self-pay | Admitting: Family Medicine

## 2018-07-15 VITALS — BP 110/74 | HR 99 | Temp 98.1°F | Resp 16 | Ht 62.0 in | Wt 139.8 lb

## 2018-07-15 DIAGNOSIS — M5442 Lumbago with sciatica, left side: Secondary | ICD-10-CM

## 2018-07-15 MED ORDER — PREDNISONE 5 MG (48) PO TBPK: ORAL_TABLET | ORAL | 0 refills | Status: DC

## 2018-07-15 NOTE — Progress Notes (Signed)
Name: Helen Green   MRN: [id number]    DOB: 01-31-1966   Date:07/15/2018       Progress Note  Subjective  Chief Complaint  Chief Complaint  Patient presents with  . Back Pain    seen at ER     HPI  Pt presents with concern for ongoing back pain.  She was in [place], leaning over to pack a back, had sudden onset of lower back pain with numbness/tingling in BLE.  She got on a plane and flew home, went directly to [facility] ER on 07/10/2018.  Still having intermittent LLE radiation.  Was given toradol and robaxin in the ER, this helped, but she never filled her Rx for flexeril.  No imaging at [facility] ER, UA showed small amnt of blood - she has never had hematuria work-up. Denies BLE weakness.  Patient Active Problem List   Diagnosis Date Noted  . Insomnia, persistent 05/03/2015  . Functional dyspepsia 05/03/2015  . Gastro-esophageal reflux disease without esophagitis 05/03/2015  . History of anemia 05/03/2015  . Perennial allergic rhinitis with seasonal variation 05/03/2015  . 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 Vitamin D deficiency 05/03/2015  . Irritable bowel syndrome with diarrhea 05/03/2015    Social History   Tobacco Use  . Smoking status: Never Smoker  . Smokeless tobacco: Never Used  Substance Use Topics  . Alcohol use: No    Alcohol/week: 0.0 standard drinks     Current Outpatient Medications:  .  albuterol (PROVENTIL HFA;VENTOLIN HFA) 108 (90 Base) MCG/ACT inhaler, Inhale 2 puffs into the lungs every 6 (six) hours as needed for wheezing or shortness of breath., Disp: 1 Inhaler, Rfl: 2 .  fexofenadine (ALLEGRA ALLERGY) 180 MG tablet, Take 1 tablet (180 mg total) by mouth as needed., Disp: 30 tablet, Rfl: 5 .  fluticasone (FLONASE) 50 MCG/ACT nasal spray, instill 2 spray into each nostril at bedtime as needed, Disp: 16 g, Rfl: 2 .  hyoscyamine (LEVSIN, ANASPAZ) 0.125 MG tablet, TAKE 1 TABLET BY MOUTH EVERY 4 HOURS IF NEEDED, Disp: 30 tablet, Rfl: 0 .   Hyoscyamine Sulfate 0.375 MG TBCR, Take 1 tablet (0.375 mg total) by mouth 2 (two) times daily., Disp: 60 tablet, Rfl: 1 .  ketorolac (TORADOL) 10 MG tablet, Take 1 tablet (10 mg total) by mouth every 6 (six) hours as needed for up to 5 days., Disp: 20 tablet, Rfl: 0 .  omeprazole (PRILOSEC) 40 MG capsule, Take 1 capsule (40 mg total) by mouth daily., Disp: 30 capsule, Rfl: 5  Allergies  Allergen Reactions  . Apple Hives, Shortness Of Breath and Swelling  . Avocado Hives, Shortness Of Breath and Swelling    and raw fruit and vegetables  . Daucus Carota Hives, Shortness Of Breath and Swelling  . Other Hives, Shortness Of Breath and Swelling    Pears, avacados, nuts    I personally reviewed active problem list, medication list, allergies, notes from last encounter with the patient/caregiver today.  ROS  Ten systems reviewed and is negative except as mentioned in HPI.  Objective  Vitals:   07/15/18 1347  BP: 110/74  Pulse: 99  Resp: 16  Temp: 98.1 F (36.7 C)  TempSrc: Oral  SpO2: 99%  Weight: 139 lb 12.8 oz (63.4 kg)  Height: 5\' 2"  (1.575 m)   Body mass index is 25.57 kg/m.  Nursing Note and Vital Signs reviewed.  Physical Exam  Constitutional: Patient appears well-developed and well-nourished. No distress.  HENT: Head: Normocephalic and  atraumatic.  Neck: Normal range of motion. Neck supple. No JVD present. No thyromegaly present.  Cardiovascular: Normal rate, regular rhythm and normal heart sounds.  No murmur heard. No BLE edema. Pulmonary/Chest: Effort normal and breath sounds normal. No respiratory distress. Abdominal: Soft. Bowel sounds are normal, no distension. There is no tenderness. No masses. No CVA tenderness. Musculoskeletal: Normal range of motion, no joint effusions. No gross deformities. +Tenderness to low back musculature. Neurological: Pt is alert and oriented to person, place, and time. No cranial nerve deficit. Coordination, balance, strength, speech  and gait are normal.  Skin: Skin is warm and dry. No rash noted. No erythema.  Psychiatric: Patient has a normal mood and affect. behavior is normal. Judgment and thought content normal.  No results found for this or any previous visit (from the past 72 hour(s)).  Assessment & Plan  1. Acute bilateral low back pain with left-sided sciatica - predniSONE (STERAPRED UNI-PAK 48 TAB) 5 MG (48) TBPK tablet; Take as directed  Dispense: 48 tablet; Refill: 0 - Back exercises discussed; recommend rest and progress exercises as tolerated.  - At the end of the patient's visit she notes that she has workman's comp paperwork for the injury.  She did not tell this to our front desk staff nor to myself prior to the very end of the visit.  I advised that we do not file workman's comp, and that this will need to be a self-pay visit instead.  She verbalizes understanding.  She does decline hematuria evaluation today and will schedule a separate follow up to address this.  -Red flags and when to present for emergency care or RTC including fever >101.44F, chest pain, shortness of breath, new/worsening/un-resolving symptoms, frank hematuria, saddle anesthesia, BLE weakness. reviewed with patient at time of visit. Follow up and care instructions discussed and provided in AVS.

## 2018-07-15 NOTE — Patient Instructions (Signed)
Low Back Sprain Rehab  Ask your health care provider which exercises are safe for you. Do exercises exactly as told by your health care provider and adjust them as directed. It is normal to feel mild stretching, pulling, tightness, or discomfort as you do these exercises, but you should stop right away if you feel sudden pain or your pain gets worse. Do not begin these exercises until told by your health care provider.  Stretching and range of motion exercises  These exercises warm up your muscles and joints and improve the movement and flexibility of your back. These exercises also help to relieve pain, numbness, and tingling.  Exercise A: Lumbar rotation    1. Lie on your back on a firm surface and bend your knees.  2. Straighten your arms out to your sides so each arm forms an "L" shape with a side of your body (a 90 degree angle).  3. Slowly move both of your knees to one side of your body until you feel a stretch in your lower back. Try not to let your shoulders move off of the floor.  4. Hold for __________ seconds.  5. Tense your abdominal muscles and slowly move your knees back to the starting position.  6. Repeat this exercise on the other side of your body.  Repeat __________ times. Complete this exercise __________ times a day.  Exercise B: Prone extension on elbows    1. Lie on your abdomen on a firm surface.  2. Prop yourself up on your elbows.  3. Use your arms to help lift your chest up until you feel a gentle stretch in your abdomen and your lower back.  ? This will place some of your body weight on your elbows. If this is uncomfortable, try stacking pillows under your chest.  ? Your hips should stay down, against the surface that you are lying on. Keep your hip and back muscles relaxed.  4. Hold for __________ seconds.  5. Slowly relax your upper body and return to the starting position.  Repeat __________ times. Complete this exercise __________ times a day.  Strengthening exercises  These  exercises build strength and endurance in your back. Endurance is the ability to use your muscles for a long time, even after they get tired.  Exercise C: Pelvic tilt  1. Lie on your back on a firm surface. Bend your knees and keep your feet flat.  2. Tense your abdominal muscles. Tip your pelvis up toward the ceiling and flatten your lower back into the floor.  ? To help with this exercise, you may place a small towel under your lower back and try to push your back into the towel.  3. Hold for __________ seconds.  4. Let your muscles relax completely before you repeat this exercise.  Repeat __________ times. Complete this exercise __________ times a day.  Exercise D: Alternating arm and leg raises    1. Get on your hands and knees on a firm surface. If you are on a hard floor, you may want to use padding to cushion your knees, such as an exercise mat.  2. Line up your arms and legs. Your hands should be below your shoulders, and your knees should be below your hips.  3. Lift your left leg behind you. At the same time, raise your right arm and straighten it in front of you.  ? Do not lift your leg higher than your hip.  ? Do not lift your arm   higher than your shoulder.  ? Keep your abdominal and back muscles tight.  ? Keep your hips facing the ground.  ? Do not arch your back.  ? Keep your balance carefully, and do not hold your breath.  4. Hold for __________ seconds.  5. Slowly return to the starting position and repeat with your right leg and your left arm.  Repeat __________ times. Complete this exercise __________ times a day.  Exercise E: Abdominal set with straight leg raise    1. Lie on your back on a firm surface.  2. Bend one of your knees and keep your other leg straight.  3. Tense your abdominal muscles and lift your straight leg up, 4-6 inches (10-15 cm) off the ground.  4. Keep your abdominal muscles tight and hold for __________ seconds.  ? Do not hold your breath.  ? Do not arch your back. Keep it  flat against the ground.  5. Keep your abdominal muscles tense as you slowly lower your leg back to the starting position.  6. Repeat with your other leg.  Repeat __________ times. Complete this exercise __________ times a day.  Posture and body mechanics    Body mechanics refers to the movements and positions of your body while you do your daily activities. Posture is part of body mechanics. Good posture and healthy body mechanics can help to relieve stress in your body's tissues and joints. Good posture means that your spine is in its natural S-curve position (your spine is neutral), your shoulders are pulled back slightly, and your head is not tipped forward. The following are general guidelines for applying improved posture and body mechanics to your everyday activities.  Standing    · When standing, keep your spine neutral and your feet about hip-width apart. Keep a slight bend in your knees. Your ears, shoulders, and hips should line up.  · When you do a task in which you stand in one place for a long time, place one foot up on a stable object that is 2-4 inches (5-10 cm) high, such as a footstool. This helps keep your spine neutral.  Sitting    · When sitting, keep your spine neutral and keep your feet flat on the floor. Use a footrest, if necessary, and keep your thighs parallel to the floor. Avoid rounding your shoulders, and avoid tilting your head forward.  · When working at a desk or a computer, keep your desk at a height where your hands are slightly lower than your elbows. Slide your chair under your desk so you are close enough to maintain good posture.  · When working at a computer, place your monitor at a height where you are looking straight ahead and you do not have to tilt your head forward or downward to look at the screen.  Resting    · When lying down and resting, avoid positions that are most painful for you.  · If you have pain with activities such as sitting, bending, stooping, or squatting  (flexion-based activities), lie in a position in which your body does not bend very much. For example, avoid curling up on your side with your arms and knees near your chest (fetal position).  · If you have pain with activities such as standing for a long time or reaching with your arms (extension-based activities), lie with your spine in a neutral position and bend your knees slightly. Try the following positions:  · Lying on your side with a   pillow between your knees.  · Lying on your back with a pillow under your knees.  Lifting    · When lifting objects, keep your feet at least shoulder-width apart and tighten your abdominal muscles.  · Bend your knees and hips and keep your spine neutral. It is important to lift using the strength of your legs, not your back. Do not lock your knees straight out.  · Always ask for help to lift heavy or awkward objects.  This information is not intended to replace advice given to you by your health care provider. Make sure you discuss any questions you have with your health care provider.  Document Released: 10/08/2005 Document Revised: 06/14/2016 Document Reviewed: 07/20/2015  Elsevier Interactive Patient Education © 2018 Elsevier Inc.

## 2018-08-08 ENCOUNTER — Encounter: Payer: Self-pay | Admitting: Gastroenterology

## 2018-08-08 ENCOUNTER — Ambulatory Visit: Payer: 59 | Admitting: Gastroenterology

## 2018-08-08 VITALS — BP 109/75 | HR 73 | Resp 16 | Ht 62.0 in | Wt 140.0 lb

## 2018-08-08 DIAGNOSIS — K581 Irritable bowel syndrome with constipation: Secondary | ICD-10-CM | POA: Diagnosis not present

## 2018-08-08 DIAGNOSIS — K640 First degree hemorrhoids: Secondary | ICD-10-CM

## 2018-08-08 DIAGNOSIS — K602 Anal fissure, unspecified: Secondary | ICD-10-CM | POA: Diagnosis not present

## 2018-08-08 NOTE — Progress Notes (Signed)
Cephas Darby, MD [address]  [place]  [place],  [address]  Main: [phone]  Fax: [phone] Pager: [phone]   Primary Care Physician: Steele Sizer, MD  Primary Gastroenterologist:  Dr. Cephas Darby  Chief Complaint  Patient presents with  . Irritable Bowel Syndrome  . Hemorrhoids    HPI: Helen Green is a 52 y.o. female, Here for follow-up of chronic left-sided abdominal pain. Patient was previously seen by Dr. Vicente Males, underwent imaging and endoscopy to evaluation for chronic abdominal pain which were unremarkable. She continues to have chronic left-sided abdominal pain associated with bloating. She continues to have constipation, straining and hard stools. She reports that her abdominal pain occurs mostly at night interfering with her sleep, with significant gas and burping. She has been taking hyoscyamine as needed which provides some relief. Bentyl has not helped. She switched from omeprazole to Pepcid for heartburn as omeprazole did not help.  She was sent to me to discuss about rectal bleeding and possible hemorrhoid ligation has her colonoscopy was normal. She was seen by Dr. Ancil Boozer about a month ago due to three-months history of severe rectal burning/sharp pain and bleeding that resulted after straining to have a bowel movement. She was prescribed Anusol and nitroglycerin ointment along with Colace. She reports that the nitroglycerin ointment significantly helped and her symptoms resolved.  She went to [facility] ER yesterday due to sudden onset of severe low back pain. She was returning from [facility] yesterday by flight, suddenly experienced low back pain in the airport and dropped down to the floor. She went to ER right away. She was seen by PA, the symptoms were attributed to lumbar strain and patient's pain improved with Toradol and Robaxin in the ER. She was discharged home on Toradol, Flexeril. She is accompanied by  her husband to the office today and walking uncomfortable due to ongoing low back pain.  Her main concern today is ongoing left-sided abdominal pain, bloating and heartburn  Follow-up visit 08/08/2018 Patient is taking hyoscyamine 0.375 mg once a day, at bedtime which helps her sleep without abdominal discomfort.  She has not tried twice daily.  She has been bothered by her hemorrhoid symptoms.  She reports that her stools are fairly regular and does alternate between constipation and diarrhea.  Current Outpatient Medications:  .  fexofenadine (ALLEGRA ALLERGY) 180 MG tablet, Take 1 tablet (180 mg total) by mouth as needed., Disp: 30 tablet, Rfl: 5 .  fluticasone (FLONASE) 50 MCG/ACT nasal spray, instill 2 spray into each nostril at bedtime as needed, Disp: 16 g, Rfl: 2 .  Hyoscyamine Sulfate 0.375 MG TBCR, Take 1 tablet (0.375 mg total) by mouth 2 (two) times daily., Disp: 60 tablet, Rfl: 1 .  omeprazole (PRILOSEC) 40 MG capsule, Take 1 capsule (40 mg total) by mouth daily., Disp: 30 capsule, Rfl: 5 .  albuterol (PROVENTIL HFA;VENTOLIN HFA) 108 (90 Base) MCG/ACT inhaler, Inhale 2 puffs into the lungs every 6 (six) hours as needed for wheezing or shortness of breath. (Patient not taking: Reported on 08/08/2018), Disp: 1 Inhaler, Rfl: 2 .  hyoscyamine (LEVSIN, ANASPAZ) 0.125 MG tablet, TAKE 1 TABLET BY MOUTH EVERY 4 HOURS IF NEEDED (Patient not taking: Reported on 08/08/2018), Disp: 30 tablet, Rfl: 0 .  predniSONE (STERAPRED UNI-PAK 48 TAB) 5 MG (48) TBPK tablet, Take as directed (Patient not taking: Reported on 08/08/2018), Disp: 48 tablet, Rfl: 0   Allergies as of 08/08/2018 - Review Complete 08/08/2018  Allergen Reaction Noted  . Apple Hives, Shortness Of Breath, and Swelling 05/03/2015  . Avocado Hives, Shortness Of Breath, and Swelling 05/03/2015  . Daucus carota Hives, Shortness Of Breath, and Swelling 07/30/2014  . Other Hives, Shortness Of Breath, and Swelling 05/03/2015    NSAIDs:  none  Antiplts/Anticoagulants/Anti thrombotics: none  GI procedures:  EGD and colonoscopy by Dr. Vicente Males 09/27/2017  - Normal examined duodenum. - Normal esophagus. - Normal stomach. Biopsied.  - One 3 mm polyp in the transverse colon, removed with a cold biopsy forceps. Resected and retrieved. - The examination was otherwise normal on direct and retroflexion views.  DIAGNOSIS:  A. STOMACH; COLD BIOPSY:  - ANTRAL MUCOSA WITH MILD CHRONIC GASTRITIS.  - NEGATIVE FOR H. PYLORI, DYSPLASIA AND MALIGNANCY.   B. COLON POLYP, TRANSVERSE; COLD BIOPSY:  - TUBULAR ADENOMA.  - NEGATIVE FOR HIGH-GRADE DYSPLASIA AND MALIGNANCY.   ROS:  General: Negative for anorexia, weight loss, fever, chills, fatigue, weakness. ENT: Negative for hoarseness, difficulty swallowing , nasal congestion. CV: Negative for chest pain, angina, palpitations, dyspnea on exertion, peripheral edema.  Respiratory: Negative for dyspnea at rest, dyspnea on exertion, cough, sputum, wheezing.  GI: See history of present illness. GU:  Negative for dysuria, hematuria, urinary incontinence, urinary frequency, nocturnal urination.  Endo: Negative for unusual weight change.    Physical Examination:   BP 109/75 (BP Location: Left Arm, Patient Position: Sitting, Cuff Size: Normal)   Pulse 73   Resp 16   Ht 5\' 2"  (1.575 m)   Wt 140 lb (63.5 kg)   BMI 25.61 kg/m   General: Well-nourished, well-developed in no acute distress.  Eyes: No icterus. Conjunctivae pink. Mouth: Oropharyngeal mucosa moist and pink , no lesions erythema or exudate. Lungs: Clear to auscultation bilaterally. Non-labored. Heart: Regular rate and rhythm, no murmurs rubs or gallops.  Abdomen: Bowel sounds are normal, left mid quadrant tenderness, moderately distended, tympanitic, no hepatosplenomegaly or masses, no hernia , no rebound or guarding.   Extremities: No lower extremity edema. No clubbing or deformities. Neuro: Alert and oriented x 3.  Grossly  intact. Skin: Warm and dry, no jaundice.   Psych: Alert and cooperative, normal mood and affect.   Imaging Studies: reviewed  Assessment and Plan:   Helen Green is a 52 y.o. Hispanic female chronic left-sided abdominal pain associated with bloating and constipation consistent with constipation predominant IBS.   IBS-C: Colonoscopy and CT A/P unremarkable Continue high-fiber diet Given her samples of her low-dose linaclotide 72 MCG daily if constipation gets worse Continue hyoscyamine 0.375 mg twice daily for left lower quadrant abdominal pain  GERD: EGD unremarkable Continue Pepcid for now  Probable anal fissure/symptomatic hemorrhoids: Relieved with topical nitroglycerin and Anusol suppository Restart topical nitroglycerin Perform hemorrhoid ligation today, consent obtained  tubular adenoma colon: Surveillance colonoscopy in 09/2022  Follow up in 2 weeks   Dr [name], MD

## 2018-08-21 ENCOUNTER — Ambulatory Visit: Payer: 59 | Admitting: Gastroenterology

## 2018-08-21 ENCOUNTER — Encounter: Payer: Self-pay | Admitting: Gastroenterology

## 2018-08-21 VITALS — BP 118/72 | HR 69 | Ht 62.0 in | Wt 140.2 lb

## 2018-08-21 DIAGNOSIS — K640 First degree hemorrhoids: Secondary | ICD-10-CM

## 2018-08-21 NOTE — Progress Notes (Signed)

## 2018-08-21 NOTE — Progress Notes (Deleted)
Helen Darby, MD [address]  [place]  [place], [place] [address]  Main: [phone]  Fax: [phone] Pager: [phone]   Primary Care Physician: Steele Sizer, MD  Primary Gastroenterologist:  Dr. Cephas Green  Chief Complaint  Patient presents with  . Hemorrhoid Banding #2    HPI: Helen Green is a 52 y.o. female, Here for follow-up of chronic left-sided abdominal pain. Patient was previously seen by Dr. Vicente Males, underwent imaging and endoscopy to evaluation for chronic abdominal pain which were unremarkable. She continues to have chronic left-sided abdominal pain associated with bloating. She continues to have constipation, straining and hard stools. She reports that her abdominal pain occurs mostly at night interfering with her sleep, with significant gas and burping. She has been taking hyoscyamine as needed which provides some relief. Bentyl has not helped. She switched from omeprazole to Pepcid for heartburn as omeprazole did not help.  She was sent to me to discuss about rectal bleeding and possible hemorrhoid ligation has her colonoscopy was normal. She was seen by Dr. Ancil Boozer about a month ago due to three-months history of severe rectal burning/sharp pain and bleeding that resulted after straining to have a bowel movement. She was prescribed Anusol and nitroglycerin ointment along with Colace. She reports that the nitroglycerin ointment significantly helped and her symptoms resolved.  She went to [facility] ER yesterday due to sudden onset of severe low back pain. She was returning from [facility] yesterday by flight, suddenly experienced low back pain in the airport and dropped down to the floor. She went to ER right away. She was seen by PA, the symptoms were attributed to lumbar strain and patient's pain improved with Toradol and Robaxin in the ER. She was discharged home on Toradol, Flexeril. She is accompanied by her husband to the  office today and walking uncomfortable due to ongoing low back pain.  Her main concern today is ongoing left-sided abdominal pain, bloating and heartburn  Follow-up visit 08/08/2018 Patient is taking hyoscyamine 0.375 mg once a day, at bedtime which helps her sleep without abdominal discomfort.  She has not tried twice daily.  She has been bothered by her hemorrhoid symptoms.  She reports that her stools are fairly regular and does alternate between constipation and diarrhea.  Current Outpatient Medications:  .  fexofenadine (ALLEGRA ALLERGY) 180 MG tablet, Take 1 tablet (180 mg total) by mouth as needed., Disp: 30 tablet, Rfl: 5 .  fluticasone (FLONASE) 50 MCG/ACT nasal spray, instill 2 spray into each nostril at bedtime as needed, Disp: 16 g, Rfl: 2 .  omeprazole (PRILOSEC) 40 MG capsule, Take 1 capsule (40 mg total) by mouth daily., Disp: 30 capsule, Rfl: 5 .  predniSONE (STERAPRED UNI-PAK 48 TAB) 5 MG (48) TBPK tablet, Take as directed, Disp: 48 tablet, Rfl: 0 .  albuterol (PROVENTIL HFA;VENTOLIN HFA) 108 (90 Base) MCG/ACT inhaler, Inhale 2 puffs into the lungs every 6 (six) hours as needed for wheezing or shortness of breath. (Patient not taking: Reported on 08/21/2018), Disp: 1 Inhaler, Rfl: 2 .  Hyoscyamine Sulfate 0.375 MG TBCR, Take 1 tablet (0.375 mg total) by mouth 2 (two) times daily., Disp: 60 tablet, Rfl: 1   Allergies as of 08/21/2018 - Review Complete 08/21/2018  Allergen Reaction Noted  . Apple Hives, Shortness Of Breath, and Swelling 05/03/2015  . Avocado Hives, Shortness Of Breath, and Swelling 05/03/2015  . Daucus carota Hives, Shortness Of Breath, and Swelling 07/30/2014  . Other Hives,  Shortness Of Breath, and Swelling 05/03/2015    NSAIDs: none  Antiplts/Anticoagulants/Anti thrombotics: none  GI procedures:  EGD and colonoscopy by Dr. Vicente Males 09/27/2017  - Normal examined duodenum. - Normal esophagus. - Normal stomach. Biopsied.  - One 3 mm polyp in the  transverse colon, removed with a cold biopsy forceps. Resected and retrieved. - The examination was otherwise normal on direct and retroflexion views.  DIAGNOSIS:  A. STOMACH; COLD BIOPSY:  - ANTRAL MUCOSA WITH MILD CHRONIC GASTRITIS.  - NEGATIVE FOR H. PYLORI, DYSPLASIA AND MALIGNANCY.   B. COLON POLYP, TRANSVERSE; COLD BIOPSY:  - TUBULAR ADENOMA.  - NEGATIVE FOR HIGH-GRADE DYSPLASIA AND MALIGNANCY.   ROS:  General: Negative for anorexia, weight loss, fever, chills, fatigue, weakness. ENT: Negative for hoarseness, difficulty swallowing , nasal congestion. CV: Negative for chest pain, angina, palpitations, dyspnea on exertion, peripheral edema.  Respiratory: Negative for dyspnea at rest, dyspnea on exertion, cough, sputum, wheezing.  GI: See history of present illness. GU:  Negative for dysuria, hematuria, urinary incontinence, urinary frequency, nocturnal urination.  Endo: Negative for unusual weight change.    Physical Examination:   BP 118/72   Pulse 69   Ht 5\' 2"  (1.575 m)   Wt 140 lb 3.2 oz (63.6 kg)   BMI 25.64 kg/m   General: Well-nourished, well-developed in no acute distress.  Eyes: No icterus. Conjunctivae pink. Mouth: Oropharyngeal mucosa moist and pink , no lesions erythema or exudate. Lungs: Clear to auscultation bilaterally. Non-labored. Heart: Regular rate and rhythm, no murmurs rubs or gallops.  Abdomen: Bowel sounds are normal, left mid quadrant tenderness, moderately distended, tympanitic, no hepatosplenomegaly or masses, no hernia , no rebound or guarding.   Extremities: No lower extremity edema. No clubbing or deformities. Neuro: Alert and oriented x 3.  Grossly intact. Skin: Warm and dry, no jaundice.   Psych: Alert and cooperative, normal mood and affect.   Imaging Studies: reviewed  Assessment and Plan:   Helen Green is a 52 y.o. Hispanic female chronic left-sided abdominal pain associated with bloating and constipation consistent with  constipation predominant IBS.   IBS-C: Colonoscopy and CT A/P unremarkable Continue high-fiber diet Given her samples of her low-dose linaclotide 72 MCG daily if constipation gets worse Continue hyoscyamine 0.375 mg twice daily for left lower quadrant abdominal pain  GERD: EGD unremarkable Continue Pepcid for now  Probable anal fissure/symptomatic hemorrhoids: Relieved with topical nitroglycerin and Anusol suppository Restart topical nitroglycerin Perform hemorrhoid ligation today, consent obtained  tubular adenoma colon: Surveillance colonoscopy in 09/2022  Follow up in 2 weeks   Dr [name], MD

## 2018-08-21 NOTE — Progress Notes (Signed)
PROCEDURE NOTE: The patient presents with symptomatic grade 1 hemorrhoids, unresponsive to maximal medical therapy, requesting rubber band ligation of his/her hemorrhoidal disease.  All risks, benefits and alternative forms of therapy were described and informed consent was obtained.  In the Left Lateral Decubitus position (if anoscopy is performed) anoscopic examination revealed grade 1 hemorrhoids in the all position(s).   The decision was made to band the RA internal hemorrhoid, and the [facility] O'Regan System was used to perform band ligation without complication.  Digital anorectal examination was then performed to assure proper positioning of the band, and to adjust the banded tissue as required.  The patient was discharged home without pain or other issues.  Dietary and behavioral recommendations were given and (if necessary - prescriptions were given), along with follow-up instructions.  The patient will return 2 weeks for follow-up and possible additional banding as required.  No complications were encountered and the patient tolerated the procedure well.   

## 2018-09-26 ENCOUNTER — Ambulatory Visit: Payer: 59 | Admitting: Gastroenterology

## 2018-11-14 ENCOUNTER — Encounter: Payer: Self-pay | Admitting: Family Medicine

## 2018-11-14 ENCOUNTER — Ambulatory Visit: Payer: 59 | Admitting: Family Medicine

## 2018-11-14 VITALS — BP 110/68 | HR 97 | Temp 98.3°F | Resp 16 | Ht 62.0 in | Wt 140.4 lb

## 2018-11-14 DIAGNOSIS — J101 Influenza due to other identified influenza virus with other respiratory manifestations: Secondary | ICD-10-CM

## 2018-11-14 DIAGNOSIS — K219 Gastro-esophageal reflux disease without esophagitis: Secondary | ICD-10-CM | POA: Diagnosis not present

## 2018-11-14 DIAGNOSIS — R509 Fever, unspecified: Secondary | ICD-10-CM | POA: Diagnosis not present

## 2018-11-14 LAB — POCT INFLUENZA A/B
INFLUENZA A, POC: NEGATIVE
Influenza B, POC: POSITIVE — AB

## 2018-11-14 MED ORDER — OMEPRAZOLE 40 MG PO CPDR: 40.0000 mg | DELAYED_RELEASE_CAPSULE | Freq: Every day | ORAL | 0 refills | Status: DC

## 2018-11-14 MED ORDER — OSELTAMIVIR PHOSPHATE 75 MG PO CAPS: 75.0000 mg | ORAL_CAPSULE | Freq: Two times a day (BID) | ORAL | 0 refills | Status: AC

## 2018-11-14 MED ORDER — BENZONATATE 100 MG PO CAPS: 100.0000 mg | ORAL_CAPSULE | Freq: Three times a day (TID) | ORAL | 0 refills | Status: DC | PRN

## 2018-11-14 NOTE — Progress Notes (Signed)
Name: Helen Green   MRN: [id number]    DOB: 12-09-1965   Date:11/14/2018       Progress Note  Subjective  Chief Complaint  Chief Complaint  Patient presents with  . Fever    101.3 yesterdaycough, bodyaches, chills,  . Otalgia    HPI  Pt presents with concern for 36 hours of productive cough, body aches, headaches, chills, fevers up to 101.3 at home.  No NVD, no chest pain or shortness of breath.  She is eating and drinking well. Took tylenol and this did help with her symptoms.  She is Flu B+ in office today.  She also requests refill of omeprazole - 30 day supply is provided.  Patient Active Problem List   Diagnosis Date Noted  . Insomnia, persistent 05/03/2015  . Functional dyspepsia 05/03/2015  . Gastro-esophageal reflux disease without esophagitis 05/03/2015  . History of anemia 05/03/2015  . Perennial allergic rhinitis with seasonal variation 05/03/2015  . 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 Vitamin D deficiency 05/03/2015  . Irritable bowel syndrome with diarrhea 05/03/2015    Social History   Tobacco Use  . Smoking status: Never Smoker  . Smokeless tobacco: Never Used  Substance Use Topics  . Alcohol use: No    Alcohol/week: 0.0 standard drinks     Current Outpatient Medications:  .  albuterol (PROVENTIL HFA;VENTOLIN HFA) 108 (90 Base) MCG/ACT inhaler, Inhale 2 puffs into the lungs every 6 (six) hours as needed for wheezing or shortness of breath., Disp: 1 Inhaler, Rfl: 2 .  fexofenadine (ALLEGRA ALLERGY) 180 MG tablet, Take 1 tablet (180 mg total) by mouth as needed., Disp: 30 tablet, Rfl: 5 .  fluticasone (FLONASE) 50 MCG/ACT nasal spray, instill 2 spray into each nostril at bedtime as needed, Disp: 16 g, Rfl: 2 .  omeprazole (PRILOSEC) 40 MG capsule, Take 1 capsule (40 mg total) by mouth daily., Disp: 30 capsule, Rfl: 0 .  benzonatate (TESSALON PERLES) 100 MG capsule, Take 1 capsule (100 mg total) by mouth 3 (three) times daily as  needed., Disp: 30 capsule, Rfl: 0 .  Hyoscyamine Sulfate 0.375 MG TBCR, Take 1 tablet (0.375 mg total) by mouth 2 (two) times daily., Disp: 60 tablet, Rfl: 1 .  oseltamivir (TAMIFLU) 75 MG capsule, Take 1 capsule (75 mg total) by mouth 2 (two) times daily for 5 days., Disp: 10 capsule, Rfl: 0 .  predniSONE (STERAPRED UNI-PAK 48 TAB) 5 MG (48) TBPK tablet, Take as directed (Patient not taking: Reported on 11/14/2018), Disp: 48 tablet, Rfl: 0  Allergies  Allergen Reactions  . Apple Hives, Shortness Of Breath and Swelling  . Avocado Hives, Shortness Of Breath and Swelling    and raw fruit and vegetables  . Daucus Carota Hives, Shortness Of Breath and Swelling  . Other Hives, Shortness Of Breath and Swelling    Pears, avacados, nuts    I personally reviewed active problem list, medication list, allergies, notes from last encounter, lab results with the patient/caregiver today.  ROS  Ten systems reviewed and is negative except as mentioned in HPI  Objective  Vitals:   11/14/18 1110  BP: 110/68  Pulse: 97  Resp: 16  Temp: 98.3 F (36.8 C)  TempSrc: Oral  SpO2: 93%  Weight: 140 lb 6.4 oz (63.7 kg)  Height: 5\' 2"  (1.575 m)    Body mass index is 25.68 kg/m.  Nursing Note and Vital Signs reviewed.  Physical Exam  Constitutional: Patient appears well-developed and well-nourished. No distress.  HENT: Head: Normocephalic and atraumatic. Ears: bilateral TMs with no erythema or effusion; Nose: Nose normal. Mouth/Throat: Oropharynx is clear and moist. No oropharyngeal exudate or tonsillar swelling.  Eyes: Conjunctivae and EOM are normal. No scleral icterus.  Pupils are equal, round, and reactive to light.  Neck: Normal range of motion. Neck supple. No JVD present. No thyromegaly present.  Cardiovascular: Normal rate, regular rhythm and normal heart sounds.  No murmur heard. No BLE edema. Pulmonary/Chest: Effort normal and breath sounds normal. No respiratory distress. Musculoskeletal:  Normal range of motion, no joint effusions. No gross deformities Neurological: Pt is alert and oriented to person, place, and time. No cranial nerve deficit. Coordination, balance, strength, speech and gait are normal.  Skin: Skin is warm and dry. No rash noted. No erythema.  Psychiatric: Patient has a normal mood and affect. behavior is normal. Judgment and thought content normal.  Results for orders placed or performed in visit on 11/14/18 (from the past 72 hour(s))  POCT Influenza A/B     Status: Abnormal   Collection Time: 11/14/18 11:20 AM  Result Value Ref Range   Influenza A, POC Negative Negative   Influenza B, POC Positive (A) Negative    Assessment & Plan  1. Fever and chills - POCT Influenza A/B  2. Influenza B - Tylenol PRN; declines Mucinex as it upsets her stomach - oseltamivir (TAMIFLU) 75 MG capsule; Take 1 capsule (75 mg total) by mouth 2 (two) times daily for 5 days.  Dispense: 10 capsule; Refill: 0 - benzonatate (TESSALON PERLES) 100 MG capsule; Take 1 capsule (100 mg total) by mouth 3 (three) times daily as needed.  Dispense: 30 capsule; Refill: 0  -Red flags and when to present for emergency care or RTC including fever >101.30F, chest pain, shortness of breath, new/worsening/un-resolving symptoms, reviewed with patient at time of visit. Follow up and care instructions discussed and provided in AVS.

## 2018-11-14 NOTE — Patient Instructions (Signed)

## 2018-11-18 ENCOUNTER — Encounter: Payer: Self-pay | Admitting: Emergency Medicine

## 2018-11-18 ENCOUNTER — Telehealth: Payer: Self-pay | Admitting: Emergency Medicine

## 2018-11-18 NOTE — Telephone Encounter (Signed)
Copied from [place] [id number]. Topic: General - Inquiry >> Nov 18, 2018  8:12 AM Scherrie Gerlach wrote: Reason for CRM: pt states she is still not feeling better.  Margaretha Seeds on 11/14/2018. Pt would like a work note to be out one more day, today.  Pt states please put on mychart

## 2018-11-18 NOTE — Telephone Encounter (Signed)
Patient notified letter in [place]

## 2018-11-18 NOTE — Telephone Encounter (Signed)
Please provide note for her - she may return after fever is <99.5 x24 hours.

## 2018-11-18 NOTE — Telephone Encounter (Signed)
Please advise. Can she get a note for work See [employer]

## 2018-12-01 ENCOUNTER — Telehealth: Payer: Self-pay | Admitting: Gastroenterology

## 2018-12-01 NOTE — Telephone Encounter (Signed)
Patient called today stating she needed to make an appointment with Dr Marius Ditch. (last seen 07-2018) She has an appointment on 12-30-2018. She would like to talk with the nurse about her bleeding hemorrhoids since this is new to her.

## 2018-12-03 NOTE — Telephone Encounter (Signed)
Spoke with pt concerning rectal bleeding and hemorrhoids, pt has scheduled appt for 12/04/2018

## 2018-12-04 ENCOUNTER — Ambulatory Visit: Payer: 59 | Admitting: Gastroenterology

## 2018-12-04 ENCOUNTER — Encounter: Payer: Self-pay | Admitting: Gastroenterology

## 2018-12-04 VITALS — BP 103/72 | HR 94 | Resp 17 | Ht 62.0 in | Wt 140.8 lb

## 2018-12-04 DIAGNOSIS — K589 Irritable bowel syndrome without diarrhea: Secondary | ICD-10-CM | POA: Diagnosis not present

## 2018-12-04 DIAGNOSIS — K601 Chronic anal fissure: Secondary | ICD-10-CM | POA: Insufficient documentation

## 2018-12-04 MED ORDER — HYOSCYAMINE SULFATE 0.125 MG PO TABS: 0.1250 mg | ORAL_TABLET | ORAL | 0 refills | Status: DC | PRN

## 2018-12-04 NOTE — Progress Notes (Signed)
Cephas Darby, MD [address]  [place]  [place], [place] [address]  Main: [phone]  Fax: [phone] Pager: [phone]   Primary Care Physician: Steele Sizer, MD  Primary Gastroenterologist:  Dr. Cephas Darby  Chief Complaint  Patient presents with  . Rectal Bleeding    HPI: Helen Green is a 53 y.o. female, Here for follow-up of chronic left-sided abdominal pain. Patient was previously seen by Dr. Vicente Males, underwent imaging and endoscopy to evaluation for chronic abdominal pain which were unremarkable. She continues to have chronic left-sided abdominal pain associated with bloating. She continues to have constipation, straining and hard stools. She reports that her abdominal pain occurs mostly at night interfering with her sleep, with significant gas and burping. She has been taking hyoscyamine as needed which provides some relief. Bentyl has not helped. She switched from omeprazole to Pepcid for heartburn as omeprazole did not help.  She was sent to me to discuss about rectal bleeding and possible hemorrhoid ligation has her colonoscopy was normal. She was seen by Dr. Ancil Boozer about a month ago due to three-months history of severe rectal burning/sharp pain and bleeding that resulted after straining to have a bowel movement. She was prescribed Anusol and nitroglycerin ointment along with Colace. She reports that the nitroglycerin ointment significantly helped and her symptoms resolved.  She went to [facility] ER yesterday due to sudden onset of severe low back pain. She was returning from [facility] yesterday by flight, suddenly experienced low back pain in the airport and dropped down to the floor. She went to ER right away. She was seen by PA, the symptoms were attributed to lumbar strain and patient's pain improved with Toradol and Robaxin in the ER. She was discharged home on Toradol, Flexeril. She is accompanied by her husband to the office  today and walking uncomfortable due to ongoing low back pain.  Her main concern today is ongoing left-sided abdominal pain, bloating and heartburn  Follow-up visit 08/08/2018 Patient is taking hyoscyamine 0.375 mg once a day, at bedtime which helps her sleep without abdominal discomfort.  She has not tried twice daily.  She has been bothered by her hemorrhoid symptoms.  She reports that her stools are fairly regular and does alternate between constipation and diarrhea.  Follow-up visit 12/04/2018 She made an urgent visit today to see me due to 3 weeks of worsening of severe rectal pain, every time she has a bowel movement, reports that its ripping sensation.  She has history of anal fissure for which I started her on Nitropaste.  She felt significant relief using it, however discontinued.  Now with recurrence of pain, she just started using it once a day for the last 2 days.  She reports that the bleeding has subsided but continues to have severe pain.  I perform hemorrhoid ligation for symptomatic hemorrhoids few months ago.  She reports that hyoscyamine is helping with her IBS symptoms.  Current Outpatient Medications:  .  albuterol (PROVENTIL HFA;VENTOLIN HFA) 108 (90 Base) MCG/ACT inhaler, Inhale 2 puffs into the lungs every 6 (six) hours as needed for wheezing or shortness of breath., Disp: 1 Inhaler, Rfl: 2 .  fexofenadine (ALLEGRA ALLERGY) 180 MG tablet, Take 1 tablet (180 mg total) by mouth as needed., Disp: 30 tablet, Rfl: 5 .  fluticasone (FLONASE) 50 MCG/ACT nasal spray, instill 2 spray into each nostril at bedtime as needed, Disp: 16 g, Rfl: 2 .  omeprazole (PRILOSEC) 40 MG capsule, Take  1 capsule (40 mg total) by mouth daily., Disp: 30 capsule, Rfl: 0 .  benzonatate (TESSALON PERLES) 100 MG capsule, Take 1 capsule (100 mg total) by mouth 3 (three) times daily as needed. (Patient not taking: Reported on 12/04/2018), Disp: 30 capsule, Rfl: 0 .  hyoscyamine (LEVSIN, ANASPAZ) 0.125 MG tablet,  Take 1 tablet (0.125 mg total) by mouth every 4 (four) hours as needed., Disp: 30 tablet, Rfl: 0   Allergies as of 12/04/2018 - Review Complete 12/04/2018  Allergen Reaction Noted  . Apple Hives, Shortness Of Breath, and Swelling 05/03/2015  . Avocado Hives, Shortness Of Breath, and Swelling 05/03/2015  . Daucus carota Hives, Shortness Of Breath, and Swelling 07/30/2014  . Other Hives, Shortness Of Breath, and Swelling 05/03/2015    NSAIDs: none  Antiplts/Anticoagulants/Anti thrombotics: none  GI procedures:  EGD and colonoscopy by Dr. Vicente Males 09/27/2017  - Normal examined duodenum. - Normal esophagus. - Normal stomach. Biopsied.  - One 3 mm polyp in the transverse colon, removed with a cold biopsy forceps. Resected and retrieved. - The examination was otherwise normal on direct and retroflexion views.  DIAGNOSIS:  A. STOMACH; COLD BIOPSY:  - ANTRAL MUCOSA WITH MILD CHRONIC GASTRITIS.  - NEGATIVE FOR H. PYLORI, DYSPLASIA AND MALIGNANCY.   B. COLON POLYP, TRANSVERSE; COLD BIOPSY:  - TUBULAR ADENOMA.  - NEGATIVE FOR HIGH-GRADE DYSPLASIA AND MALIGNANCY.   ROS:  General: Negative for anorexia, weight loss, fever, chills, fatigue, weakness. ENT: Negative for hoarseness, difficulty swallowing , nasal congestion. CV: Negative for chest pain, angina, palpitations, dyspnea on exertion, peripheral edema.  Respiratory: Negative for dyspnea at rest, dyspnea on exertion, cough, sputum, wheezing.  GI: See history of present illness. GU:  Negative for dysuria, hematuria, urinary incontinence, urinary frequency, nocturnal urination.  Endo: Negative for unusual weight change.    Physical Examination:   BP 103/72 (BP Location: Left Arm, Patient Position: Sitting, Cuff Size: Normal)   Pulse 94   Resp 17   Ht 5\' 2"  (1.575 m)   Wt 140 lb 12.8 oz (63.9 kg)   BMI 25.75 kg/m   General: Well-nourished, well-developed in no acute distress.  Eyes: No icterus. Conjunctivae pink. Mouth:  Oropharyngeal mucosa moist and pink , no lesions erythema or exudate. Lungs: Clear to auscultation bilaterally. Non-labored. Heart: Regular rate and rhythm, no murmurs rubs or gallops.  Abdomen: Bowel sounds are normal, left mid quadrant tenderness, moderately distended, tympanitic, no hepatosplenomegaly or masses, no hernia , no rebound or guarding.   Rectum: Mild tenderness in the posterior anal canal, moderate tenderness in the anterior anal canal consistent with anal fissure Extremities: No lower extremity edema. No clubbing or deformities. Neuro: Alert and oriented x 3.  Grossly intact. Skin: Warm and dry, no jaundice.   Psych: Alert and cooperative, normal mood and affect.   Imaging Studies: reviewed  Assessment and Plan:   Helen Green is a 53 y.o. Hispanic female chronic left-sided abdominal pain associated with bloating and constipation consistent with constipation predominant IBS.   IBS-C: Fairly under control Colonoscopy and CT A/P unremarkable Continue high-fiber diet Continue hyoscyamine 0.375 mg twice daily for left lower quadrant abdominal pain   GERD: EGD unremarkable Continue Pepcid for now  Chronic anal fissure Relieved with topical nitroglycerin in the past and Anusol suppository Patient has rectiva, 0.4% nitroglycerin.  Advised her to complete the tube I will send in prescription for nifedipine 0.5% with lidocaine Advised her to continue at least for 3 months even if her symptoms resolve Defer  hemorrhoid ligation today  tubular adenoma colon: Surveillance colonoscopy in 09/2022  Follow up in 3 months or sooner if needed  Dr [name], MD

## 2018-12-30 ENCOUNTER — Ambulatory Visit: Payer: 59 | Admitting: Gastroenterology

## 2018-12-30 ENCOUNTER — Encounter

## 2019-03-03 ENCOUNTER — Ambulatory Visit (INDEPENDENT_AMBULATORY_CARE_PROVIDER_SITE_OTHER): Payer: 59 | Admitting: Gastroenterology

## 2019-03-03 ENCOUNTER — Encounter: Payer: Self-pay | Admitting: Gastroenterology

## 2019-03-03 DIAGNOSIS — K601 Chronic anal fissure: Secondary | ICD-10-CM

## 2019-03-03 NOTE — Progress Notes (Signed)
[name], MD [address]  [place]  [place], [place] [address]  Main: [phone]  Fax: [phone]    Gastroenterology Consultation Video Visit  Referring Provider:     Steele Sizer, MD Primary Care Physician:  Steele Sizer, MD Primary Gastroenterologist:  Dr. Cephas Darby Reason for Consultation:     Chronic anal fissure        HPI:   Helen Green is a 53 y.o. female referred by Dr. Steele Sizer, MD  for consultation & management of chronic anal fissure  Virtual Visit Video Note  I connected with Helen Green on 03/03/19 at 10:00 AM EDT by video and verified that I am speaking with the correct person using two identifiers.   I discussed the limitations, risks, security and privacy concerns of performing an evaluation and management service by video and the availability of in person appointments. I also discussed with the patient that there may be a patient responsible charge related to this service. The patient expressed understanding and agreed to proceed.  Location of the Patient: Home  Location of the provider: Home office  Persons participating in the visit: Patient and provider only   History of Present Illness: She reports feeling better but still having intermittent flareups.  She continues to use topical nifedipine about 2 times a day.  Denies constipation, pushing or straining.  She just started taking sitz baths which provides good relief.  She works on the computer for long hours sitting, she is trying to get a standing desk .  She does notice scant amount of blood on wiping intermittently   Past Medical History:  Diagnosis Date  . Allergy   . Chronic abdominal pain   . Chronic insomnia   . Food allergy   . Functional dyspepsia   . GERD (gastroesophageal reflux disease)   . IBS (irritable bowel syndrome)   . Insomnia   . Irritable bowel syndrome with diarrhea   . Left arm weakness   . Neck pain   . Subacute  maxillary sinusitis   . Vitamin A deficiency     Past Surgical History:  Procedure Laterality Date  . ABDOMINAL HYSTERECTOMY    . COLONOSCOPY WITH PROPOFOL N/A 09/27/2017   Procedure: COLONOSCOPY WITH PROPOFOL;  Surgeon: Jonathon Bellows, MD;  Location: [facility] ENDOSCOPY;  Service: Gastroenterology;  Laterality: N/A;  . ESOPHAGOGASTRODUODENOSCOPY (EGD) WITH PROPOFOL N/A 09/27/2017   Procedure: ESOPHAGOGASTRODUODENOSCOPY (EGD) WITH PROPOFOL;  Surgeon: Jonathon Bellows, MD;  Location: [facility] ENDOSCOPY;  Service: Gastroenterology;  Laterality: N/A;  . TUBAL LIGATION      Current Outpatient Medications:  .  fexofenadine (ALLEGRA ALLERGY) 180 MG tablet, Take 1 tablet (180 mg total) by mouth as needed., Disp: 30 tablet, Rfl: 5 .  fluticasone (FLONASE) 50 MCG/ACT nasal spray, instill 2 spray into each nostril at bedtime as needed, Disp: 16 g, Rfl: 2 .  hyoscyamine (LEVSIN, ANASPAZ) 0.125 MG tablet, Take 1 tablet (0.125 mg total) by mouth every 4 (four) hours as needed., Disp: 30 tablet, Rfl: 0 .  omeprazole (PRILOSEC) 40 MG capsule, Take 1 capsule (40 mg total) by mouth daily., Disp: 30 capsule, Rfl: 0 .  albuterol (PROVENTIL HFA;VENTOLIN HFA) 108 (90 Base) MCG/ACT inhaler, Inhale 2 puffs into the lungs every 6 (six) hours as needed for wheezing or shortness of breath. (Patient not taking: Reported on 03/03/2019), Disp: 1 Inhaler, Rfl: 2 .  benzonatate (TESSALON PERLES) 100 MG capsule, Take 1 capsule (100 mg total) by mouth 3 (  three) times daily as needed. (Patient not taking: Reported on 12/04/2018), Disp: 30 capsule, Rfl: 0    Family History  Problem Relation Age of Onset  . Diabetes Mother   . Hyperlipidemia Mother   . Hypertension Mother   . Liver disease Father   . Cancer Brother 81       brain cancer  . Breast cancer Neg Hx      Social History   Tobacco Use  . Smoking status: Never Smoker  . Smokeless tobacco: Never Used  Substance Use Topics  . Alcohol use: No    Alcohol/week: 0.0 standard  drinks  . Drug use: No    Allergies as of 03/03/2019 - Review Complete 03/03/2019  Allergen Reaction Noted  . Apple Hives, Shortness Of Breath, and Swelling 05/03/2015  . Avocado Hives, Shortness Of Breath, and Swelling 05/03/2015  . Daucus carota Hives, Shortness Of Breath, and Swelling 07/30/2014  . Other Hives, Shortness Of Breath, and Swelling 05/03/2015    Imaging Studies: Reviewed  Assessment and Plan:   Helen Green is a 53 y.o. female with IBS constipation, chronic anal fissure.  Constipation is under control  Chronic anal fissure: Continue topical nifedipine, increase to 3-4 times a day Continue sitz bath Try surgical donut I have discussed with her about other management options including Botox injection and surgical repair.  Patient said she would like to give some more time   Follow Up Instructions:   I discussed the assessment and treatment plan with the patient. The patient was provided an opportunity to ask questions and all were answered. The patient agreed with the plan and demonstrated an understanding of the instructions.   The patient was advised to call back or seek an in-person evaluation if the symptoms worsen or if the condition fails to improve as anticipated.  I provided 15 minutes of face-to-face time during this encounter.   Follow up in 2 to 3 months   Cephas Darby, MD

## 2019-03-05 ENCOUNTER — Ambulatory Visit: Payer: 59 | Admitting: Gastroenterology

## 2019-05-20 ENCOUNTER — Encounter: Payer: Self-pay | Admitting: Family Medicine

## 2019-05-20 ENCOUNTER — Other Ambulatory Visit: Payer: Self-pay

## 2019-05-20 ENCOUNTER — Telehealth: Payer: Self-pay | Admitting: Family Medicine

## 2019-05-20 ENCOUNTER — Ambulatory Visit: Payer: Self-pay | Admitting: *Deleted

## 2019-05-20 ENCOUNTER — Ambulatory Visit (INDEPENDENT_AMBULATORY_CARE_PROVIDER_SITE_OTHER): Payer: 59 | Admitting: Family Medicine

## 2019-05-20 DIAGNOSIS — E559 Vitamin D deficiency, unspecified: Secondary | ICD-10-CM

## 2019-05-20 DIAGNOSIS — Z862 Personal history of diseases of the blood and blood-forming organs and certain disorders involving the immune mechanism: Secondary | ICD-10-CM

## 2019-05-20 DIAGNOSIS — R200 Anesthesia of skin: Secondary | ICD-10-CM | POA: Diagnosis not present

## 2019-05-20 DIAGNOSIS — R29898 Other symptoms and signs involving the musculoskeletal system: Secondary | ICD-10-CM | POA: Diagnosis not present

## 2019-05-20 NOTE — Progress Notes (Signed)
Name: Helen Green   MRN: [id number]    DOB: 11/01/1965   Date:05/20/2019       Progress Note  Subjective  Chief Complaint  Chief Complaint  Patient presents with  . Numbness    left arm numbness, weak. left leg weakness for 3 days    I connected with  [name]  on 05/20/19 at 10:20 AM EDT by a video enabled telemedicine application and verified that I am speaking with the correct person using two identifiers.  I discussed the limitations of evaluation and management by telemedicine and the availability of in person appointments. The patient expressed understanding and agreed to proceed. Staff also discussed with the patient that there may be a patient responsible charge related to this service. Patient Location: Home Provider Location: Home Additional Individuals present: None  HPI  Pt presents with concern for LEFT arm numbness and weaknes for 3 days.  She has reportedly gone to the ER 3 years ago for similar incident, it is unfortunately not available in [place] or Chart Review.  Chart review shows MRI brain in 2016 due to severe headaches, which was normal. Also had CT head in 2018 after several weeks of severe headaches, and this was normal. She reports ongoing intermittent episodes of similar symptoms over 3-5 years now, this being a typical episodes for her.  She notes pain starts in the left side of her neck and posterior head and radiates into the left arm - she has some numbness and difficulty typing and gripping objects.  She notes the numbness and some weakness goes into the left leg.  Symptoms have been present consistently for 3 days now.   Denies confusion, slurred speech, blurred vision, double vision, no right-sided weakness, nausea, vomiting, fevers, chills.  Patient Active Problem List   Diagnosis Date Noted  . 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 Insomnia, persistent 05/03/2015  . Functional dyspepsia 05/03/2015  .  Gastro-esophageal reflux disease without esophagitis 05/03/2015  . History of anemia 05/03/2015  . Perennial allergic rhinitis with seasonal variation 05/03/2015  . 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 Vitamin D deficiency 05/03/2015  . Irritable bowel syndrome with diarrhea 05/03/2015    Past Surgical History:  Procedure Laterality Date  . ABDOMINAL HYSTERECTOMY    . COLONOSCOPY WITH PROPOFOL N/A 09/27/2017   Procedure: COLONOSCOPY WITH PROPOFOL;  Surgeon: Jonathon Bellows, MD;  Location: [facility] ENDOSCOPY;  Service: Gastroenterology;  Laterality: N/A;  . ESOPHAGOGASTRODUODENOSCOPY (EGD) WITH PROPOFOL N/A 09/27/2017   Procedure: ESOPHAGOGASTRODUODENOSCOPY (EGD) WITH PROPOFOL;  Surgeon: Jonathon Bellows, MD;  Location: [facility] ENDOSCOPY;  Service: Gastroenterology;  Laterality: N/A;  . TUBAL LIGATION      Family History  Problem Relation Age of Onset  . Diabetes Mother   . Hyperlipidemia Mother   . Hypertension Mother   . Liver disease Father   . Cancer Brother 70       brain cancer  . Breast cancer Neg Hx     Social History   Socioeconomic History  . Marital status: Married    Spouse name: [name]  . Number of children: 2  . Years of education: Not on file  . Highest education level: Associate degree: occupational, Hotel manager, or vocational program  Occupational History  . 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 Financial resource strain: Somewhat hard  . Food insecurity    Worry: Never true    Inability: Never true  . Transportation  needs    Medical: No    Non-medical: No  Tobacco Use  . Smoking status: Never Smoker  . Smokeless tobacco: Never Used  Substance and Sexual Activity  . Alcohol use: No    Alcohol/week: 0.0 standard drinks  . Drug use: No  . Sexual activity: Yes  Lifestyle  . Physical activity    Days per week: 7 days    Minutes per session: 30 min  . Stress: To some extent  Relationships  . Social connections     Talks on phone: More than three times a week    Gets together: More than three times a week    Attends religious service: More than 4 times per year    Active member of club or organization: Yes    Attends meetings of clubs or organizations: More than 4 times per year    Relationship status: Married  . Intimate partner violence    Fear of current or ex partner: No    Emotionally abused: No    Physically abused: No    Forced sexual activity: No  Other Topics Concern  . Not on file  Social History Narrative   On her second marriage, helping raise her husband's 39 yo niece.      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  albuterol (PROVENTIL HFA;VENTOLIN HFA) 108 (90 Base) MCG/ACT inhaler, Inhale 2 puffs into the lungs every 6 (six) hours as needed for wheezing or shortness of breath. (Patient not taking: Reported on 03/03/2019), Disp: 1 Inhaler, Rfl: 2 .  benzonatate (TESSALON PERLES) 100 MG capsule, Take 1 capsule (100 mg total) by mouth 3 (three) times daily as needed. (Patient not taking: Reported on 12/04/2018), Disp: 30 capsule, Rfl: 0 .  fexofenadine (ALLEGRA ALLERGY) 180 MG tablet, Take 1 tablet (180 mg total) by mouth as needed., Disp: 30 tablet, Rfl: 5 .  fluticasone (FLONASE) 50 MCG/ACT nasal spray, instill 2 spray into each nostril at bedtime as needed, Disp: 16 g, Rfl: 2 .  hyoscyamine (LEVSIN, ANASPAZ) 0.125 MG tablet, Take 1 tablet (0.125 mg total) by mouth every 4 (four) hours as needed., Disp: 30 tablet, Rfl: 0 .  omeprazole (PRILOSEC) 40 MG capsule, Take 1 capsule (40 mg total) by mouth daily., Disp: 30 capsule, Rfl: 0  Allergies  Allergen Reactions  . Apple Hives, Shortness Of Breath and Swelling  . Avocado Hives, Shortness Of Breath and Swelling    and raw fruit and vegetables  . Daucus Carota Hives, Shortness Of Breath and Swelling  . Other Hives, Shortness Of Breath and Swelling    Pears, avacados, nuts    I  personally reviewed active problem list, medication list, allergies, notes from last encounter, lab results, imaging with the patient/caregiver today.   ROS  Ten systems reviewed and is negative except as mentioned in HPI  Objective  Virtual encounter, vitals not obtained.  There is no height or weight on file to calculate BMI.  Physical Exam  Constitutional: Patient appears well-developed and well-nourished. No distress.  HENT: Head: Normocephalic and atraumatic.  Neck: Normal range of motion. Pulmonary/Chest: Effort normal. No respiratory distress. Speaking in complete sentences Neurological: Pt is alert and oriented to person, place, and time. Coordination, speech and gait are normal. UTA strength or sensation as this is virtual appointment. Psychiatric: Patient has a normal mood and affect. behavior is normal. Judgment and thought content normal.  No results  found for this or any previous visit (from the past 76 hour(s)).  PHQ2/9: Depression screen [facility] 2/9 11/14/2018 07/15/2018 06/09/2018 03/07/2018 07/22/2017  Decreased Interest 0 0 0 0 0  Down, Depressed, Hopeless 0 0 0 0 0  PHQ - 2 Score 0 0 0 0 0  Altered sleeping 0 0 0 - -  Tired, decreased energy 0 0 0 - -  Change in appetite 0 0 0 - -  Feeling bad or failure about yourself  0 0 0 - -  Trouble concentrating 0 0 0 - -  Moving slowly or fidgety/restless 0 0 0 - -  Suicidal thoughts 0 0 0 - -  PHQ-9 Score 0 0 0 - -  Difficult doing work/chores Not difficult at all Not difficult at all Not difficult at all - -   PHQ-2/9 Result is negative.    Fall Risk: Fall Risk  11/14/2018 07/15/2018 06/09/2018 05/16/2018 03/07/2018  Falls in the past year? 0 Yes No No No  Number falls in past yr: 0 1 - - -  Injury with Fall? 0 Yes - - -  Follow up Falls evaluation completed Follow up appointment - - -    Assessment & Plan  1. Left arm numbness - CBC with Differential/Platelet - Magnesium - COMPLETE METABOLIC PANEL WITH GFR -  VITAMIN D 25 Hydroxy (Vit-D Deficiency, Fractures) - TSH - Ambulatory referral to Neurology  2. Left leg weakness - CBC with Differential/Platelet - Magnesium - COMPLETE METABOLIC PANEL WITH GFR - VITAMIN D 25 Hydroxy (Vit-D Deficiency, Fractures) - TSH - Ambulatory referral to Neurology  3. Vitamin D deficiency - VITAMIN D 25 Hydroxy (Vit-D Deficiency, Fractures)  4. History of anemia - CBC with Differential/Platelet  She will come for labs today, referral to neurology is placed.  Discussed case with PCP Dr. Ancil Boozer who is more familiar with the patient.  Pt has had negative MRI and negative CT head in the past when having similar episodes.  We will hold off on imaging at this time, but patient is advised on strict ER precautions if symptoms become atypical for her, worsen, or change in any way, and she verbalizes understanding.    I discussed the assessment and treatment plan with the patient. The patient was provided an opportunity to ask questions and all were answered. The patient agreed with the plan and demonstrated an understanding of the instructions.  The patient was advised to call back or seek an in-person evaluation if the symptoms worsen or if the condition fails to improve as anticipated.  I provided 17.5 minutes of non-face-to-face time during this encounter.

## 2019-05-20 NOTE — Telephone Encounter (Signed)
No medications to be called in at this time; will discuss case with Dr. Ancil Boozer again tomorrow for any additional recommendations, but she needs to see the neurologist.  If symptoms worsen, she needs to let us know.  If she develops stroke like symptoms, as discussed in our visit, she needs to call 911.

## 2019-05-20 NOTE — Telephone Encounter (Signed)
Patient stated she made the referral appt for the Neurologist but the appt is not until 07/20/2019 and she wants to know if something is being called in for her until her appt.  Please advise.

## 2019-05-20 NOTE — Telephone Encounter (Signed)
Patient calls after being referred to the ED with right arm numbness. She was told it was stress related and needs to follow up with her PCP. She is requesting an appointment with PCP. Advised agent okay for ov, no triage at this time.

## 2019-05-20 NOTE — Telephone Encounter (Signed)
Patient had a virtual visit by Raquel Sarna today.

## 2019-05-21 LAB — COMPLETE METABOLIC PANEL WITH GFR
AG Ratio: 2 (calc) (ref 1.0–2.5)
ALT: 14 U/L (ref 6–29)
AST: 16 U/L (ref 10–35)
Albumin: 4.4 g/dL (ref 3.6–5.1)
Alkaline phosphatase (APISO): 56 U/L (ref 37–153)
BUN: 9 mg/dL (ref 7–25)
CO2: 29 mmol/L (ref 20–32)
Calcium: 9.4 mg/dL (ref 8.6–10.4)
Chloride: 105 mmol/L (ref 98–110)
Creat: 0.72 mg/dL (ref 0.50–1.05)
GFR, Est African American: 113 mL/min/{1.73_m2} (ref >=60)
GFR, Est Non African American: 98 mL/min/{1.73_m2} (ref >=60)
Globulin: 2.2 g/dL (calc) (ref 1.9–3.7)
Glucose, Bld: 103 mg/dL — ABNORMAL HIGH (ref 65–99)
Potassium: 4.1 mmol/L (ref 3.5–5.3)
Sodium: 141 mmol/L (ref 135–146)
Total Bilirubin: 0.6 mg/dL (ref 0.2–1.2)
Total Protein: 6.6 g/dL (ref 6.1–8.1)

## 2019-05-21 LAB — CBC WITH DIFFERENTIAL/PLATELET
Absolute Monocytes: 489 cells/uL (ref 200–950)
Basophils Absolute: 40 cells/uL (ref 0–200)
Basophils Relative: 0.6 %
Eosinophils Absolute: 348 cells/uL (ref 15–500)
Eosinophils Relative: 5.2 %
HCT: 39.6 % (ref 35.0–45.0)
Hemoglobin: 13.5 g/dL (ref 11.7–15.5)
Lymphs Abs: 2479 cells/uL (ref 850–3900)
MCH: 29.5 pg (ref 27.0–33.0)
MCHC: 34.1 g/dL (ref 32.0–36.0)
MCV: 86.5 fL (ref 80.0–100.0)
MPV: 9.9 fL (ref 7.5–12.5)
Monocytes Relative: 7.3 %
Neutro Abs: 3343 cells/uL (ref 1500–7800)
Neutrophils Relative %: 49.9 %
Platelets: 278 10*3/uL (ref 140–400)
RBC: 4.58 10*6/uL (ref 3.80–5.10)
RDW: 13.7 % (ref 11.0–15.0)
Total Lymphocyte: 37 %
WBC: 6.7 10*3/uL (ref 3.8–10.8)

## 2019-05-21 LAB — VITAMIN D 25 HYDROXY (VIT D DEFICIENCY, FRACTURES): Vit D, 25-Hydroxy: 21 ng/mL — ABNORMAL LOW (ref 30–100)

## 2019-05-21 LAB — TSH: TSH: 1.9 mIU/L

## 2019-05-21 LAB — MAGNESIUM: Magnesium: 2.2 mg/dL (ref 1.5–2.5)

## 2019-06-04 ENCOUNTER — Ambulatory Visit: Payer: Self-pay | Admitting: Gastroenterology

## 2019-06-12 ENCOUNTER — Other Ambulatory Visit: Payer: Self-pay | Admitting: Gastroenterology

## 2019-06-12 DIAGNOSIS — K589 Irritable bowel syndrome without diarrhea: Secondary | ICD-10-CM

## 2019-06-30 ENCOUNTER — Other Ambulatory Visit: Payer: Self-pay

## 2019-06-30 ENCOUNTER — Ambulatory Visit: Payer: BC Managed Care – PPO | Admitting: Gastroenterology

## 2019-06-30 ENCOUNTER — Encounter: Payer: Self-pay | Admitting: Gastroenterology

## 2019-06-30 VITALS — BP 114/72 | HR 72 | Temp 98.1°F | Resp 17 | Ht 62.0 in | Wt 141.2 lb

## 2019-06-30 DIAGNOSIS — R12 Heartburn: Secondary | ICD-10-CM | POA: Diagnosis not present

## 2019-06-30 DIAGNOSIS — G8929 Other chronic pain: Secondary | ICD-10-CM

## 2019-06-30 DIAGNOSIS — K589 Irritable bowel syndrome without diarrhea: Secondary | ICD-10-CM

## 2019-06-30 DIAGNOSIS — R1032 Left lower quadrant pain: Secondary | ICD-10-CM

## 2019-06-30 DIAGNOSIS — R1013 Epigastric pain: Secondary | ICD-10-CM

## 2019-06-30 MED ORDER — HYOSCYAMINE SULFATE 0.125 MG PO TABS: 0.1250 mg | ORAL_TABLET | ORAL | 2 refills | Status: DC | PRN

## 2019-06-30 MED ORDER — DEXILANT 60 MG PO CPDR: 60.0000 mg | DELAYED_RELEASE_CAPSULE | Freq: Every day | ORAL | 1 refills | Status: DC

## 2019-06-30 NOTE — Progress Notes (Signed)
Helen Darby, MD [address]  [place]  [place], [place] [address]  Main: [phone]  Fax: [phone] Pager: [phone]   Primary Care Physician: Steele Sizer, MD  Primary Gastroenterologist:  Dr. Cephas Green  Chief Complaint  Patient presents with  . Follow-up  . Irritable Bowel Syndrome    HPI: Helen Green is a 53 y.o. female, Here for follow-up of chronic left-sided abdominal pain. Patient was previously seen by Dr. Vicente Males, underwent imaging and endoscopy to evaluation for chronic abdominal pain which were unremarkable. She continues to have chronic left-sided abdominal pain associated with bloating. She continues to have constipation, straining and hard stools. She reports that her abdominal pain occurs mostly at night interfering with her sleep, with significant gas and burping. She has been taking hyoscyamine as needed which provides some relief. Bentyl has not helped. She switched from omeprazole to Pepcid for heartburn as omeprazole did not help.  She was sent to me to discuss about rectal bleeding and possible hemorrhoid ligation has her colonoscopy was normal. She was seen by Dr. Ancil Boozer about a month ago due to three-months history of severe rectal burning/sharp pain and bleeding that resulted after straining to have a bowel movement. She was prescribed Anusol and nitroglycerin ointment along with Colace. She reports that the nitroglycerin ointment significantly helped and her symptoms resolved.  She went to [facility] ER yesterday due to sudden onset of severe low back pain. She was returning from [facility] yesterday by flight, suddenly experienced low back pain in the airport and dropped down to the floor. She went to ER right away. She was seen by PA, the symptoms were attributed to lumbar strain and patient's pain improved with Toradol and Robaxin in the ER. She was discharged home on Toradol, Flexeril. She is accompanied by her  husband to the office today and walking uncomfortable due to ongoing low back pain.  Her main concern today is ongoing left-sided abdominal pain, bloating and heartburn  Follow-up visit 08/08/2018 Patient is taking hyoscyamine 0.375 mg once a day, at bedtime which helps her sleep without abdominal discomfort.  She has not tried twice daily.  She has been bothered by her hemorrhoid symptoms.  She reports that her stools are fairly regular and does alternate between constipation and diarrhea.  Follow-up visit 12/04/2018 She made an urgent visit today to see me due to 3 weeks of worsening of severe rectal pain, every time she has a bowel movement, reports that its ripping sensation.  She has history of anal fissure for which I started her on Nitropaste.  She felt significant relief using it, however discontinued.  Now with recurrence of pain, she just started using it once a day for the last 2 days.  She reports that the bleeding has subsided but continues to have severe pain.  I perform hemorrhoid ligation for symptomatic hemorrhoids few months ago.  She reports that hyoscyamine is helping with her IBS symptoms.  Follow-up visit 03/03/2019 She reports feeling better but still having intermittent flareups.  She continues to use topical nifedipine about 2 times a day.  Denies constipation, pushing or straining.  She just started taking sitz baths which provides good relief.  She works on the computer for long hours sitting, she is trying to get a standing desk.  She does notice scant amount of blood on wiping intermittently  Follow-up visit 06/30/2019 She reports having flareup of epigastric pain associated with heartburn as well as left  lower quadrant discomfort.  Her anal fissure has completely resolved.  Her constipation is manageable.  She ran out of hyoscyamine and requesting refill.  She is currently taking omeprazole 20 mg OTC for heartburn and epigastric pain which provides modest relief only.  She  states she has been very busy at work.   Current Outpatient Medications:  .  fexofenadine (ALLEGRA ALLERGY) 180 MG tablet, Take 1 tablet (180 mg total) by mouth as needed., Disp: 30 tablet, Rfl: 5 .  hyoscyamine (LEVSIN) 0.125 MG tablet, Take 1 tablet (0.125 mg total) by mouth every 4 (four) hours as needed., Disp: 30 tablet, Rfl: 2 .  omeprazole (PRILOSEC) 40 MG capsule, Take 1 capsule (40 mg total) by mouth daily., Disp: 30 capsule, Rfl: 0 .  dexlansoprazole (DEXILANT) 60 MG capsule, Take 1 capsule (60 mg total) by mouth daily with breakfast., Disp: 30 capsule, Rfl: 1 .  fluticasone (FLONASE) 50 MCG/ACT nasal spray, instill 2 spray into each nostril at bedtime as needed (Patient not taking: Reported on 06/30/2019), Disp: 16 g, Rfl: 2   Allergies as of 06/30/2019 - Review Complete 06/30/2019  Allergen Reaction Noted  . Apple Hives, Shortness Of Breath, and Swelling 05/03/2015  . Avocado Hives, Shortness Of Breath, and Swelling 05/03/2015  . Daucus carota Hives, Shortness Of Breath, and Swelling 07/30/2014  . Other Hives, Shortness Of Breath, and Swelling 05/03/2015    NSAIDs: none  Antiplts/Anticoagulants/Anti thrombotics: none  GI procedures:  EGD and colonoscopy by Dr. Vicente Males 09/27/2017  - Normal examined duodenum. - Normal esophagus. - Normal stomach. Biopsied.  - One 3 mm polyp in the transverse colon, removed with a cold biopsy forceps. Resected and retrieved. - The examination was otherwise normal on direct and retroflexion views.  DIAGNOSIS:  A. STOMACH; COLD BIOPSY:  - ANTRAL MUCOSA WITH MILD CHRONIC GASTRITIS.  - NEGATIVE FOR H. PYLORI, DYSPLASIA AND MALIGNANCY.   B. COLON POLYP, TRANSVERSE; COLD BIOPSY:  - TUBULAR ADENOMA.  - NEGATIVE FOR HIGH-GRADE DYSPLASIA AND MALIGNANCY.   ROS:  General: Negative for anorexia, weight loss, fever, chills, fatigue, weakness. ENT: Negative for hoarseness, difficulty swallowing , nasal congestion. CV: Negative for chest pain,  angina, palpitations, dyspnea on exertion, peripheral edema.  Respiratory: Negative for dyspnea at rest, dyspnea on exertion, cough, sputum, wheezing.  GI: See history of present illness. GU:  Negative for dysuria, hematuria, urinary incontinence, urinary frequency, nocturnal urination.  Endo: Negative for unusual weight change.    Physical Examination:   BP 114/72 (BP Location: Left Arm, Patient Position: Sitting, Cuff Size: Normal)   Pulse 72   Temp 98.1 F (36.7 C)   Resp 17   Ht 5\' 2"  (1.575 m)   Wt 141 lb 3.2 oz (64 kg)   BMI 25.83 kg/m   General: Well-nourished, well-developed in no acute distress.  Eyes: No icterus. Conjunctivae pink. Mouth: Oropharyngeal mucosa moist and pink , no lesions erythema or exudate. Lungs: Clear to auscultation bilaterally. Non-labored. Heart: Regular rate and rhythm, no murmurs rubs or gallops.  Abdomen: Bowel sounds are normal, left mid quadrant tenderness, moderately distended, tympanitic, no hepatosplenomegaly or masses, no hernia , no rebound or guarding.   Rectum: Mild tenderness in the posterior anal canal, moderate tenderness in the anterior anal canal consistent with anal fissure Extremities: No lower extremity edema. No clubbing or deformities. Neuro: Alert and oriented x 3.  Grossly intact. Skin: Warm and dry, no jaundice.   Psych: Alert and cooperative, normal mood and affect.   Imaging Studies: reviewed  Assessment and Plan:   Helen Green is a 53 y.o. Hispanic female chronic left-sided abdominal pain associated with bloating and constipation consistent with constipation predominant IBS.   IBS-C: Fairly under control Colonoscopy and CT A/P unremarkable Continue high-fiber diet Continue hyoscyamine 0.125 mg at bedtime for chronic left lower quadrant abdominal pain  GERD/epigastric pain: EGD unremarkable, no evidence of H. pylori Trial of Dexilant 60 mg daily  Chronic anal fissure Resolved  Tubular adenoma colon:  Surveillance colonoscopy in 09/2022  Follow up in 3 months or sooner if needed  Dr [name], MD

## 2019-07-16 ENCOUNTER — Encounter: Payer: Self-pay | Admitting: Neurology

## 2019-07-20 ENCOUNTER — Encounter: Payer: Self-pay | Admitting: Neurology

## 2019-07-20 ENCOUNTER — Other Ambulatory Visit: Payer: Self-pay

## 2019-07-20 ENCOUNTER — Ambulatory Visit: Payer: BC Managed Care – PPO | Admitting: Neurology

## 2019-07-20 VITALS — BP 102/60 | HR 74 | Ht 62.0 in | Wt 143.0 lb

## 2019-07-20 DIAGNOSIS — M79605 Pain in left leg: Secondary | ICD-10-CM | POA: Diagnosis not present

## 2019-07-20 DIAGNOSIS — M79602 Pain in left arm: Secondary | ICD-10-CM

## 2019-07-20 DIAGNOSIS — R202 Paresthesia of skin: Secondary | ICD-10-CM

## 2019-07-20 NOTE — Patient Instructions (Addendum)
Nerve testing of the left arm and leg.  Start physical therapy for neck stretching  ELECTROMYOGRAM AND NERVE CONDUCTION STUDIES (EMG/NCS) INSTRUCTIONS  How to Prepare The neurologist conducting the EMG will need to know if you have certain medical conditions. Tell the neurologist and other EMG lab personnel if you: . Have a pacemaker or any other electrical medical device . Take blood-thinning medications . Have hemophilia, a blood-clotting disorder that causes prolonged bleeding Bathing Take a shower or bath shortly before your exam in order to remove oils from your skin. Don't apply lotions or creams before the exam.  What to Expect You'll likely be asked to change into a hospital gown for the procedure and lie down on an examination table. The following explanations can help you understand what will happen during the exam.  . Electrodes. The neurologist or a technician places surface electrodes at various locations on your skin depending on where you're experiencing symptoms. Or the neurologist may insert needle electrodes at different sites depending on your symptoms.  . Sensations. The electrodes will at times transmit a tiny electrical current that you may feel as a twinge or spasm. The needle electrode may cause discomfort or pain that usually ends shortly after the needle is removed. If you are concerned about discomfort or pain, you may want to talk to the neurologist about taking a short break during the exam.  . Instructions. During the needle EMG, the neurologist will assess whether there is any spontaneous electrical activity when the muscle is at rest - activity that isn't present in healthy muscle tissue - and the degree of activity when you slightly contract the muscle.  He or she will give you instructions on resting and contracting a muscle at appropriate times. Depending on what muscles and nerves the neurologist is examining, he or she may ask you to change positions during the  exam.  After your EMG You may experience some temporary, minor bruising where the needle electrode was inserted into your muscle. This bruising should fade within several days. If it persists, contact your primary care doctor.

## 2019-07-20 NOTE — Progress Notes (Signed)
[facility] Clinic Note - Initial Visit   Date: 07/20/19  Helen Green MRN: [id number] DOB: 04-25-1966   Dear Raelyn Ensign, FNP:  Thank you for your kind referral of [facility] for consultation of left arm and leg pain/weakness. Although her history is well known to you, please allow [place] to reiterate it for the purpose of our medical record. The patient was accompanied to the clinic by self.   History of Present Illness: Helen Green is a 53 y.o. right-handed female with irritable bowel syndrome, GERD, and seasonal allergies presenting for evaluation of left arm and leg pain/weakness.   Starting around 2017, she began having spells of left arm pain, radiating into the upper arm and cramping to the left calf and toes.  Pain is described as achy and sore.  She also has numbness of the hand and weakness of the left arm.  She has noticed that stress and fatigue is the biggest triggers.  Rest, soft tissue massage, and sleeping tends to alleviate her symptoms.  These spells occur about 3-4 times per month and lasts about 2-3 days. She does not have associated headache or vision changes.   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     Out-side paper records, electronic medical record, and images have been reviewed where available and summarized as:  CT head without contrast 05/17/2017:  Negative  MRI brain wwo contrast 12/17/2014: 1. Normal MRI appearance of the brain. No acute or focal lesion to  explain the patient's symptoms.  2. Mild inferior maxillary and right frontal sinus disease.    Lab Results  Component Value Date   TSH 1.90 05/20/2019    Past Medical History:  Diagnosis Date  . Allergy   . Chronic abdominal pain   . Chronic insomnia   . Food allergy   . Functional dyspepsia   . GERD (gastroesophageal reflux disease)   . IBS (irritable bowel syndrome)   . Insomnia   . Irritable  bowel syndrome with diarrhea   . Left arm weakness   . Neck pain   . Subacute maxillary sinusitis   . Vitamin A deficiency     Past Surgical History:  Procedure Laterality Date  . ABDOMINAL HYSTERECTOMY    . COLONOSCOPY WITH PROPOFOL N/A 09/27/2017   Procedure: COLONOSCOPY WITH PROPOFOL;  Surgeon: Jonathon Bellows, MD;  Location: [facility] ENDOSCOPY;  Service: Gastroenterology;  Laterality: N/A;  . ESOPHAGOGASTRODUODENOSCOPY (EGD) WITH PROPOFOL N/A 09/27/2017   Procedure: ESOPHAGOGASTRODUODENOSCOPY (EGD) WITH PROPOFOL;  Surgeon: Jonathon Bellows, MD;  Location: [facility] ENDOSCOPY;  Service: Gastroenterology;  Laterality: N/A;  . TUBAL LIGATION       Medications:  Outpatient Encounter Medications as of 07/20/2019  Medication Sig Note  . dexlansoprazole (DEXILANT) 60 MG capsule Take 1 capsule (60 mg total) by mouth daily with breakfast.   . fexofenadine (ALLEGRA ALLERGY) 180 MG tablet Take 1 tablet (180 mg total) by mouth as needed. 03/07/2018: PRN  . fluticasone (FLONASE) 50 MCG/ACT nasal spray instill 2 spray into each nostril at bedtime as needed 03/07/2018: PRN  . hyoscyamine (LEVSIN) 0.125 MG tablet Take 1 tablet (0.125 mg total) by mouth every 4 (four) hours as needed.   Marland Kitchen omeprazole (PRILOSEC) 40 MG capsule Take 1 capsule (40 mg total) by mouth daily.    No facility-administered encounter medications on file as of 07/20/2019.     Allergies:  Allergies  Allergen Reactions  . Apple Hives, Shortness Of  Breath and Swelling  . Avocado Hives, Shortness Of Breath and Swelling    and raw fruit and vegetables  . Daucus Carota Hives, Shortness Of Breath and Swelling  . Other Hives, Shortness Of Breath and Swelling    Pears, avacados, nuts    Family History: Family History  Problem Relation Age of Onset  . Diabetes Mother   . Hyperlipidemia Mother   . Hypertension Mother   . Liver disease Father   . Cancer Brother 61       brain cancer  . Breast cancer Neg Hx     Social History: Social History    Tobacco Use  . Smoking status: Never Smoker  . Smokeless tobacco: Never Used  Substance Use Topics  . Alcohol use: No    Alcohol/week: 0.0 standard drinks  . Drug use: No   Social History   Social History Narrative   On her second marriage, helping raise her husband's 62 yo niece.      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   EYES: No visual changes or eye pain ENT: No hearing changes.  No history of nose bleeds.   RESPIRATORY: No cough, wheezing and shortness of breath.   CARDIOVASCULAR: Negative for chest pain, and palpitations.   GI: Negative for abdominal discomfort, blood in stools or black stools.  No recent change in bowel habits.   GU:  No history of incontinence.   MUSCLOSKELETAL: No history of joint pain or swelling.  No myalgias.   SKIN: Negative for lesions, rash, and itching.   HEMATOLOGY/ONCOLOGY: Negative for prolonged bleeding, bruising easily, and swollen nodes.  No history of cancer.   ENDOCRINE: Negative for cold or heat intolerance, polydipsia or goiter.   PSYCH:  No depression +anxiety symptoms.   NEURO: As Above.   Vital Signs:  BP 102/60   Pulse 74   Ht 5\' 2"  (1.575 m)   Wt 143 lb (64.9 kg)   SpO2 98%   BMI 26.16 kg/m    General Medical Exam:   General:  Well appearing, comfortable.   Eyes/ENT: see cranial nerve examination.   Neck:   No carotid bruits. Respiratory:  Clear to auscultation, good air entry bilaterally.   Cardiac:  Regular rate and rhythm, no murmur.   Extremities:  No deformities, edema, or skin discoloration.  Skin:  No rashes or lesions.  Neurological Exam: MENTAL STATUS including orientation to time, place, person, recent and remote memory, attention span and concentration, language, and fund of knowledge is normal.  Speech is not dysarthric.  CRANIAL NERVES: II:  No visual field  defects.   III-IV-VI: Pupils equal round and reactive to light.  Normal conjugate, extra-ocular eye movements in all directions of gaze.  No nystagmus.  No ptosis.   V:  Normal facial sensation.    VII:  Normal facial symmetry.   VIII:  Normal hearing and vestibular function.   XI:  Normal shoulder shrug and head rotation.    MOTOR:  No atrophy, fasciculations or abnormal movements.  No pronator drift.   Upper Extremity:  Right  Left  Deltoid  5/5   5/5   Biceps  5/5   5/5   Triceps  5/5   5/5   Infraspinatus 5/5  5/5  Medial pectoralis 5/5  5/5  Wrist extensors  5/5   5/5   Wrist flexors  5/5   5/5   Finger extensors  5/5   5/5   Finger flexors  5/5   5/5   Dorsal interossei  5/5   5/5   Abductor pollicis  5/5   5/5   Tone (Ashworth scale)  0  0   Lower Extremity:  Right  Left  Hip flexors  5/5   5/5   Hip extensors  5/5   5/5   Adductor 5/5  5/5  Abductor 5/5  5/5  Knee flexors  5/5   5/5   Knee extensors  5/5   5/5   Dorsiflexors  5/5   5/5   Plantarflexors  5/5   5/5   Toe extensors  5/5   5/5   Toe flexors  5/5   5/5   Tone (Ashworth scale)  0  0   MSRs:  Right        Left                  brachioradialis 2+  2+  biceps 2+  2+  triceps 2+  2+  patellar 2+  2+  ankle jerk 2+  2+  Hoffman no  no  plantar response down  down   SENSORY:  Normal and symmetric perception of light touch, pinprick, vibration, and proprioception.  Romberg's sign absent.   COORDINATION/GAIT: Normal finger-to- nose-finger and heel-to-shin.  Intact rapid alternating movements bilaterally.  Able to rise from a chair without using arms.  Gait narrow based and stable. Tandem and stressed gait intact.    IMPRESSION: 1. Left arm and leg pain due to stress-induced muscle strain.  Symptoms are not suggestive of radiculopathy.  Exam is normal and non-lateralizing.  MRI brain from 2016 was reviewed and is normal.   - Recommend NCS/EMG of the left arm and leg to better characterize the nature of  her symptoms  - Start neck PT for stretching   - Stress is the major trigger and I discussed that better management of this, will reduce the frequency of these symptoms.  She may want to talk to her PCP about seeing a counselor  2. Left hand numbness. ? Entrapment neuropathy.    - NCS/EMG will assess for this  Further recommendations pending results.    Thank you for allowing me to participate in patient's care.  If I can answer any additional questions, I would be pleased to do so.    Sincerely,    Foday Cone K. Posey Pronto, DO

## 2019-07-24 DIAGNOSIS — M542 Cervicalgia: Secondary | ICD-10-CM | POA: Diagnosis not present

## 2019-07-29 DIAGNOSIS — M542 Cervicalgia: Secondary | ICD-10-CM | POA: Diagnosis not present

## 2019-08-10 ENCOUNTER — Other Ambulatory Visit: Payer: Self-pay

## 2019-08-10 ENCOUNTER — Encounter: Payer: Self-pay | Admitting: Obstetrics and Gynecology

## 2019-08-10 ENCOUNTER — Ambulatory Visit (INDEPENDENT_AMBULATORY_CARE_PROVIDER_SITE_OTHER): Payer: BC Managed Care – PPO | Admitting: Obstetrics and Gynecology

## 2019-08-10 VITALS — BP 100/60 | Ht 62.0 in | Wt 143.0 lb

## 2019-08-10 DIAGNOSIS — R102 Pelvic and perineal pain: Secondary | ICD-10-CM

## 2019-08-10 DIAGNOSIS — R350 Frequency of micturition: Secondary | ICD-10-CM

## 2019-08-10 LAB — POCT URINALYSIS DIPSTICK
Bilirubin, UA: NEGATIVE
Blood, UA: NEGATIVE
Glucose, UA: NEGATIVE
Ketones, UA: NEGATIVE
Nitrite, UA: NEGATIVE
Protein, UA: NEGATIVE
Spec Grav, UA: 1.02 (ref 1.010–1.025)
pH, UA: 5 (ref 5.0–8.0)

## 2019-08-10 NOTE — Patient Instructions (Signed)
I value your feedback and entrusting us with your care. If you get a [facility] patient survey, I would appreciate you taking the time to let us know about your experience today. Thank you! 

## 2019-08-10 NOTE — Progress Notes (Signed)
Steele Sizer, MD   Chief Complaint  Patient presents with  . Pelvic Pain    entire pelvic area, urinary frequency but not voiding much urine, no blood in urine x 4 days    HPI:      [name] is a 53 y.o. G2P2 who LMP was No LMP recorded. Patient has had a hysterectomy., presents today for constant, achy pelvic pain for the past 4-5 days. Sx worse in PM, better in AM. Tried advil with some relief before going to bed. Has had urinary frequency/urgency with dysuria and LBP for 4 days, too. No fevers. No vag sx, no vaginal bleeding. No constipation/diarrhea. No heavy lifting. Had similar sx 2019 with neg C&S. Not recently sex active.  S/P LSH in past for leio. Hx of chronic abd pain due to GI issues of IBS/GERD. Sometimes still has period sx of breast tenderness. Doesn't feel those sx now.  Last pap 2017.   Past Medical History:  Diagnosis Date  . Allergy   . Chronic abdominal pain   . Chronic insomnia   . Food allergy   . Functional dyspepsia   . GERD (gastroesophageal reflux disease)   . IBS (irritable bowel syndrome)   . Insomnia   . Irritable bowel syndrome with diarrhea   . Left arm weakness   . Neck pain   . Subacute maxillary sinusitis   . Vitamin A deficiency     Past Surgical History:  Procedure Laterality Date  . ABDOMINAL HYSTERECTOMY    . COLONOSCOPY WITH PROPOFOL N/A 09/27/2017   Procedure: COLONOSCOPY WITH PROPOFOL;  Surgeon: Jonathon Bellows, MD;  Location: [facility] ENDOSCOPY;  Service: Gastroenterology;  Laterality: N/A;  . ESOPHAGOGASTRODUODENOSCOPY (EGD) WITH PROPOFOL N/A 09/27/2017   Procedure: ESOPHAGOGASTRODUODENOSCOPY (EGD) WITH PROPOFOL;  Surgeon: Jonathon Bellows, MD;  Location: [facility] ENDOSCOPY;  Service: Gastroenterology;  Laterality: N/A;  . TUBAL LIGATION      Family History  Problem Relation Age of Onset  . Diabetes Mother   . Hyperlipidemia Mother   . Hypertension Mother   . Liver disease Father   . Cancer Brother 63       brain cancer   . Breast cancer Neg Hx     Social History   Socioeconomic History  . Marital status: Married    Spouse name: [name]  . Number of children: 2  . Years of education: Not on file  . Highest education level: Associate degree: occupational, Hotel manager, or vocational program  Occupational History  . 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 Financial resource strain: Somewhat hard  . Food insecurity    Worry: Never true    Inability: Never true  . Transportation needs    Medical: No    Non-medical: No  Tobacco Use  . Smoking status: Never Smoker  . Smokeless tobacco: Never Used  Substance and Sexual Activity  . Alcohol use: No    Alcohol/week: 0.0 standard drinks  . Drug use: No  . Sexual activity: Yes  Lifestyle  . Physical activity    Days per week: 7 days    Minutes per session: 30 min  . Stress: To some extent  Relationships  . Social connections    Talks on phone: More than three times a week    Gets together: More than three times a week    Attends religious service: More than 4 times per year    Active member of club or  organization: Yes    Attends meetings of clubs or organizations: More than 4 times per year    Relationship status: Married  . Intimate partner violence    Fear of current or ex partner: No    Emotionally abused: No    Physically abused: No    Forced sexual activity: No  Other Topics Concern  . Not on file  Social History Narrative   On her second marriage, helping raise her husband's 57 yo niece.      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 fexofenadine (ALLEGRA ALLERGY) 180 MG tablet Take 1 tablet (180 mg total) by mouth as needed. 30 tablet 5  . fluticasone (FLONASE) 50 MCG/ACT nasal spray instill 2 spray into each nostril at bedtime as needed 16 g 2  . hyoscyamine (LEVSIN)  0.125 MG tablet Take 1 tablet (0.125 mg total) by mouth every 4 (four) hours as needed. 30 tablet 2  . omeprazole (PRILOSEC) 40 MG capsule Take 1 capsule (40 mg total) by mouth daily. 30 capsule 0  . dexlansoprazole (DEXILANT) 60 MG capsule Take 1 capsule (60 mg total) by mouth daily with breakfast. 30 capsule 1   No facility-administered medications prior to visit.       ROS:  Review of Systems  Constitutional: Negative for fatigue, fever and unexpected weight change.  Respiratory: Negative for cough, shortness of breath and wheezing.   Cardiovascular: Negative for chest pain, palpitations and leg swelling.  Gastrointestinal: Negative for blood in stool, constipation, diarrhea, nausea and vomiting.  Endocrine: Negative for cold intolerance, heat intolerance and polyuria.  Genitourinary: Positive for dysuria, frequency, pelvic pain and urgency. Negative for dyspareunia, flank pain, genital sores, hematuria, menstrual problem, vaginal bleeding, vaginal discharge and vaginal pain.  Musculoskeletal: Positive for back pain. Negative for joint swelling and myalgias.  Skin: Negative for rash.  Neurological: Negative for dizziness, syncope, light-headedness, numbness and headaches.  Hematological: Negative for adenopathy.  Psychiatric/Behavioral: Negative for agitation, confusion, sleep disturbance and suicidal ideas. The patient is not nervous/anxious.     OBJECTIVE:   Vitals:  BP 100/60   Ht 5\' 2"  (1.575 m)   Wt 143 lb (64.9 kg)   BMI 26.16 kg/m   Physical Exam Vitals signs reviewed.  Constitutional:      Appearance: She is well-developed. She is not ill-appearing or toxic-appearing.  Neck:     Musculoskeletal: Normal range of motion.  Pulmonary:     Effort: Pulmonary effort is normal.  Abdominal:     Palpations: Abdomen is soft.     Tenderness: There is generalized abdominal tenderness. There is no right CVA tenderness or left CVA tenderness.  Genitourinary:    General:  Normal vulva.     Pubic Area: No rash.      Labia:        Right: No rash, tenderness or lesion.        Left: No rash, tenderness or lesion.      Vagina: Normal. No vaginal discharge, erythema or tenderness.     Cervix: Normal.     Uterus: Absent. Not tender.      Adnexa:        Right: Tenderness present. No mass.         Left: Tenderness present. No mass.    Musculoskeletal: Normal range of  motion.  Skin:    General: Skin is warm and dry.  Neurological:     General: No focal deficit present.     Mental Status: She is alert and oriented to person, place, and time.     Cranial Nerves: No cranial nerve deficit.  Psychiatric:        Mood and Affect: Mood normal.        Behavior: Behavior normal.        Thought Content: Thought content normal.        Judgment: Judgment normal.     Results: Results for orders placed or performed in visit on 08/10/19 (from the past 24 hour(s))  POCT Urinalysis Dipstick     Status: Abnormal   Collection Time: 08/10/19  9:29 AM  Result Value Ref Range   Color, UA yellow    Clarity, UA clear    Glucose, UA Negative Negative   Bilirubin, UA neg    Ketones, UA neg    Spec Grav, UA 1.020 1.010 - 1.025   Blood, UA neg    pH, UA 5.0 5.0 - 8.0   Protein, UA Negative Negative   Urobilinogen, UA     Nitrite, UA neg    Leukocytes, UA Trace (A) Negative   Appearance     Odor       Assessment/Plan: Pelvic pain--Slight tenderness on exam. Essent neg UA. Check C&S. NSAIDs prn sx for now. Will call with C&S results. If neg and still sx, will check GYN u/s. F/u sooner prn.   Urinary frequency - Plan: POCT Urinalysis Dipstick, Urine Culture; Check C&S. If pos, sx should improve with abx.    Return if symptoms worsen or fail to improve.  Artyom Stencel B. Roma Bondar, PA-C 08/10/2019 9:32 AM

## 2019-08-12 LAB — URINE CULTURE: Organism ID, Bacteria: NO GROWTH

## 2019-08-12 NOTE — Progress Notes (Signed)
Pls let pt know C&S neg. If still having pelvic pain, then need to check u/s. Thx

## 2019-08-13 ENCOUNTER — Telehealth: Payer: Self-pay | Admitting: Obstetrics and Gynecology

## 2019-08-13 DIAGNOSIS — R102 Pelvic and perineal pain: Secondary | ICD-10-CM

## 2019-08-13 NOTE — Telephone Encounter (Signed)
Done

## 2019-08-13 NOTE — Telephone Encounter (Signed)
Patient is schedule tomorrow Friday, 08/14/19 at 11:30. Please place order for ultrasound. Thank you!

## 2019-08-13 NOTE — Progress Notes (Signed)
Pls call pt to sched GYN u/s for pelvic pain. Thx.

## 2019-08-13 NOTE — Telephone Encounter (Signed)
-----   Message from Chad Cordial, PA-C sent at 08/13/2019 10:05 AM EDT ----- Pls call pt to sched GYN u/s for pelvic pain. Thx.

## 2019-08-13 NOTE — Progress Notes (Signed)
Called pt, no answer, LVMTRC. 

## 2019-08-14 ENCOUNTER — Ambulatory Visit (INDEPENDENT_AMBULATORY_CARE_PROVIDER_SITE_OTHER): Payer: BC Managed Care – PPO

## 2019-08-14 ENCOUNTER — Other Ambulatory Visit: Payer: Self-pay

## 2019-08-14 ENCOUNTER — Telehealth: Payer: Self-pay | Admitting: Obstetrics and Gynecology

## 2019-08-14 DIAGNOSIS — R102 Pelvic and perineal pain: Secondary | ICD-10-CM | POA: Diagnosis not present

## 2019-08-14 NOTE — Telephone Encounter (Signed)
Pt aware of neg Gyn u/s for pelvic pain, had neg C&S. Sx bad last night after increased activity yesterday with lifting at work. Sx in general worse at night. Question MSK. NSAIDs/offered pelvic PT. Pt to follow sx and will try PT if sx persist.   ULTRASOUND REPORT  Location: [facility]  Date of Service: 08/14/2019    Indications:Pelvic Pain Findings:  The uterus is absent. The cervix measures 1.7 cm  Right Ovary measures 2.1 x 2.1 x 1.0 cm. It is normal in appearance. Left Ovary measures 2.2 x 1.0 x 1.1 cm. It is normal in appearance. Survey of the adnexa demonstrates no adnexal masses. There is no free fluid in the cul de sac.  Impression: 1. The uterus is absent.  2. Normal appearing cervix 3. Normal appearing ovaries.  4. The bladder is empty postvoid.   Recommendations: 1.Clinical correlation with the patient's History and Physical Exam.   Gweneth Dimitri, RT

## 2019-08-27 ENCOUNTER — Encounter: Payer: BC Managed Care – PPO | Admitting: Neurology

## 2019-09-21 ENCOUNTER — Other Ambulatory Visit: Payer: Self-pay

## 2019-09-22 ENCOUNTER — Ambulatory Visit (INDEPENDENT_AMBULATORY_CARE_PROVIDER_SITE_OTHER): Payer: BC Managed Care – PPO | Admitting: Neurology

## 2019-09-22 ENCOUNTER — Other Ambulatory Visit: Payer: Self-pay

## 2019-09-22 DIAGNOSIS — M79602 Pain in left arm: Secondary | ICD-10-CM

## 2019-09-22 DIAGNOSIS — M79605 Pain in left leg: Secondary | ICD-10-CM

## 2019-09-22 NOTE — Procedures (Signed)
[facility]  [address], [address]  [place], [place] [address] Tel: [phone] Fax:  [phone] Test Date:  09/22/2019  Patient: Helen Green DOB: 1966/08/14 Physician: Narda Amber, DO  Sex: Female Height: 5\' 2"  Ref Phys: Narda Amber, DO  ID#: [id number] Temp: 32.5C Technician:    Patient Complaints: This is a 53 year old female referred for evaluation of episodic left side paresthesias and left leg cramps.  NCV & EMG Findings: Extensive electrodiagnostic testing of the left upper and lower extremity shows:  1. Left median, ulnar, mixed palmar, sural, and superficial peroneal nerves are within normal limits. 2. Left median, ulnar, tibial, and peroneal motor responses are within normal limits. 3. Left tibial H reflex study is within normal limits. 4. There is no evidence of active or chronic motor axonal loss changes affecting any of the tested muscles.  Motor unit configuration and recruitment pattern is within normal limits.  Impression: This is a normal study of the left upper and lower extremities.  In particular, there is no evidence of carpal tunnel syndrome, sensorimotor polyneuropathy, or cervical/lumbosacral radiculopathy.   ___________________________ Narda Amber, DO    Nerve Conduction Studies Anti Sensory Summary Table   Site NR Peak (ms) Norm Peak (ms) P-T Amp (V) Norm P-T Amp  Left Median Anti Sensory (2nd Digit)  32.5C  Wrist    2.8 <3.6 61.6 >15  Left Sup Peroneal Anti Sensory (Ant Lat Mall)  32.5C  12 cm    2.2 <4.6 20.7 >4  Left Sural Anti Sensory (Lat Mall)  32.5C  Calf    2.7 <4.6 19.8 >4  Left Ulnar Anti Sensory (5th Digit)  32.5C  Wrist    2.6 <3.1 62.9 >10   Motor Summary Table   Site NR Onset (ms) Norm Onset (ms) O-P Amp (mV) Norm O-P Amp Site1 Site2 Delta-0 (ms) Dist (cm) Vel (m/s) Norm Vel (m/s)  Left Median Motor (Abd Poll Brev)  32.5C  Wrist    2.9 <4.0 10.1 >6 Elbow Wrist 4.0 26.0 65 >50  Elbow    6.9  9.6          Left Peroneal Motor (Ext Dig Brev)  32.5C  Ankle    3.8 <6.0 3.5 >2.5 B Fib Ankle 5.7 32.0 56 >40  B Fib    9.5  3.5  Poplt B Fib 1.0 7.0 70 >40  Poplt    10.5  3.2         Left Tibial Motor (Abd Hall Brev)  32.5C  Ankle    2.7 <6.0 9.3 >4 Knee Ankle 7.4 34.0 46 >40  Knee    10.1  7.6         Left Ulnar Motor (Abd Dig Minimi)  32.5C  Wrist    2.3 <3.1 9.0 >7 B Elbow Wrist 3.2 20.0 63 >50  B Elbow    5.5  8.7  A Elbow B Elbow 1.6 10.0 63 >50  A Elbow    7.1  8.5          Comparison Summary Table   Site NR Peak (ms) Norm Peak (ms) P-T Amp (V) Site1 Site2 Delta-P (ms) Norm Delta (ms)  Left Median/Ulnar Palm Comparison (Wrist - 8cm)  32.5C  Median Palm    1.7 <2.2 69.6 Median Palm Ulnar Palm 0.3   Ulnar Palm    1.4 <2.2 26.8       H Reflex Studies   NR H-Lat (ms) Lat Norm (ms) L-R H-Lat (ms)  Left  Tibial (Gastroc)  32.5C     28.71 <35    EMG   Side Muscle Ins Act Fibs Psw Fasc Number Recrt Dur Dur. Amp Amp. Poly Poly. Comment  Left AntTibialis Nml Nml Nml Nml Nml Nml Nml Nml Nml Nml Nml Nml N/A  Left Gastroc Nml Nml Nml Nml Nml Nml Nml Nml Nml Nml Nml Nml N/A  Left Flex Dig Long Nml Nml Nml Nml Nml Nml Nml Nml Nml Nml Nml Nml N/A  Left RectFemoris Nml Nml Nml Nml Nml Nml Nml Nml Nml Nml Nml Nml N/A  Left GluteusMed Nml Nml Nml Nml Nml Nml Nml Nml Nml Nml Nml Nml N/A  Left 1stDorInt Nml Nml Nml Nml Nml Nml Nml Nml Nml Nml Nml Nml N/A  Left PronatorTeres Nml Nml Nml Nml Nml Nml Nml Nml Nml Nml Nml Nml N/A  Left Biceps Nml Nml Nml Nml Nml Nml Nml Nml Nml Nml Nml Nml N/A  Left Triceps Nml Nml Nml Nml Nml Nml Nml Nml Nml Nml Nml Nml N/A  Left Deltoid Nml Nml Nml Nml Nml Nml Nml Nml Nml Nml Nml Nml N/A      Waveforms:

## 2019-12-01 ENCOUNTER — Encounter: Payer: Self-pay | Admitting: Family Medicine

## 2019-12-01 ENCOUNTER — Other Ambulatory Visit: Payer: Self-pay

## 2019-12-01 ENCOUNTER — Telehealth: Payer: Self-pay | Admitting: Family Medicine

## 2019-12-01 ENCOUNTER — Ambulatory Visit: Payer: BC Managed Care – PPO | Admitting: Family Medicine

## 2019-12-01 VITALS — BP 133/76 | HR 82 | Temp 96.7°F | Resp 16 | Ht 62.0 in | Wt 144.8 lb

## 2019-12-01 DIAGNOSIS — R200 Anesthesia of skin: Secondary | ICD-10-CM

## 2019-12-01 DIAGNOSIS — F419 Anxiety disorder, unspecified: Secondary | ICD-10-CM

## 2019-12-01 DIAGNOSIS — R42 Dizziness and giddiness: Secondary | ICD-10-CM

## 2019-12-01 DIAGNOSIS — N951 Menopausal and female climacteric states: Secondary | ICD-10-CM

## 2019-12-01 DIAGNOSIS — R29898 Other symptoms and signs involving the musculoskeletal system: Secondary | ICD-10-CM | POA: Diagnosis not present

## 2019-12-01 DIAGNOSIS — Z1231 Encounter for screening mammogram for malignant neoplasm of breast: Secondary | ICD-10-CM

## 2019-12-01 DIAGNOSIS — E559 Vitamin D deficiency, unspecified: Secondary | ICD-10-CM | POA: Diagnosis not present

## 2019-12-01 DIAGNOSIS — K582 Mixed irritable bowel syndrome: Secondary | ICD-10-CM

## 2019-12-01 DIAGNOSIS — L989 Disorder of the skin and subcutaneous tissue, unspecified: Secondary | ICD-10-CM

## 2019-12-01 MED ORDER — DULOXETINE HCL 30 MG PO CPEP: 30.0000 mg | ORAL_CAPSULE | Freq: Every day | ORAL | 0 refills | Status: DC

## 2019-12-01 NOTE — Patient Instructions (Signed)

## 2019-12-01 NOTE — Telephone Encounter (Signed)
Pt was seen today and said you mentioned that you would refer her to dermatologist and she just wanted to remind you.

## 2019-12-01 NOTE — Progress Notes (Signed)
Name: Helen Green   MRN: [id number]    DOB: Aug 19, 1966   Date:12/01/2019       Progress Note  Subjective  Chief Complaint  Chief Complaint  Patient presents with  . Medication Refill  . Menopause    Has been going on for 10 years-has gotten worst-the Hotflashes are becoming more frequent, headaches, lightheaded and irritated   . Irritable Bowel Syndrome  . Allergic Rhinitis     HPI  Anxiety: she has difficulty leaving thing undone, even when she is very tired she is unable to leave the dishes int he sink. 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 Explained duloxetine should help with that also   IBS: she is seeing Dr. Marius Ditch now , she states her stomach is the least of her concerns right now  Vertigo: She states she has episodes of vertigo when she is anxious and very tired . The room spins, no tinnitus, nausea, vomiting or hearing loss. She states symptoms resolves when she takes a nap. She is not sure of aggravating factors, but always when she is doing something like cooking, cleaning , doing dishes, only happened once at work while walking. She states 4 episodes in the past month or so. Advised to discuss it with neurologist. I will give her Huel Coventry maneuver  Left arm and leg pain and weakness: seen by Dr. Posey Pronto neurologist at [facility]. She had a normal NCX and EMG, she was sent to PT and is not helping, still has pain that radiates from left side of neck down to her left arm and sometimes left leg. She thinks the pain gets worse with anxiety  .   Lesion scalp: going on for months , on scalp near forehead, scabs over, but grows back .    Patient Active Problem List   Diagnosis Date Noted  . 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 Insomnia, persistent 05/03/2015  . Functional dyspepsia 05/03/2015  .  Gastro-esophageal reflux disease without esophagitis 05/03/2015  . History of anemia 05/03/2015  . Perennial allergic rhinitis with seasonal variation 05/03/2015  . 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 Vitamin D deficiency 05/03/2015  . Irritable bowel syndrome with diarrhea 05/03/2015    Past Surgical History:  Procedure Laterality Date  . ABDOMINAL HYSTERECTOMY    . COLONOSCOPY WITH PROPOFOL N/A 09/27/2017   Procedure: COLONOSCOPY WITH PROPOFOL;  Surgeon: Jonathon Bellows, MD;  Location: [facility] ENDOSCOPY;  Service: Gastroenterology;  Laterality: N/A;  . ESOPHAGOGASTRODUODENOSCOPY (EGD) WITH PROPOFOL N/A 09/27/2017   Procedure: ESOPHAGOGASTRODUODENOSCOPY (EGD) WITH PROPOFOL;  Surgeon: Jonathon Bellows, MD;  Location: [facility] ENDOSCOPY;  Service: Gastroenterology;  Laterality: N/A;  . TUBAL LIGATION      Family History  Problem Relation Age of Onset  . Diabetes Mother   . Hyperlipidemia Mother   . Hypertension Mother   . Liver disease Father   . Cancer Brother 60       brain cancer  . Breast cancer Neg Hx     Social History   Socioeconomic History  . Marital status: Married    Spouse name: [name]  . Number of children: 2  . Years of education: Not on file  . Highest education level: Associate degree: occupational, Hotel manager, or vocational program  Occupational History  . 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 Smoking status: Never Smoker  . Smokeless tobacco: Never Used  Substance and Sexual Activity  . Alcohol use: No    Alcohol/week: 0.0 standard drinks  . Drug use: No  . Sexual activity: Yes  Other Topics Concern  . Not on file  Social History Narrative   On her second marriage, helping raise her husband's 69 yo niece.      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 Difficulty of Paying Living Expenses: Not on  file  Food Insecurity:   . Worried About Charity fundraiser in the Last Year: Not on file  . Ran Out of Food in the Last Year: Not on file  Transportation Needs:   . Lack of Transportation (Medical): Not on file  . Lack of Transportation (Non-Medical): Not on file  Physical Activity:   . Days of Exercise per Week: Not on file  . Minutes of Exercise per Session: Not on file  Stress:   . Feeling of Stress : Not on file  Social Connections:   . Frequency of Communication with Friends and Family: Not on file  . Frequency of Social Gatherings with Friends and Family: Not on file  . Attends Religious Services: Not on file  . Active Member of Clubs or Organizations: Not on file  . Attends Archivist Meetings: Not on file  . Marital Status: Not on file  Intimate Partner Violence:   . Fear of Current or Ex-Partner: Not on file  . Emotionally Abused: Not on file  . Physically Abused: Not on file  . Sexually Abused: Not on file     Current Outpatient Medications:  .  fexofenadine (ALLEGRA ALLERGY) 180 MG tablet, Take 1 tablet (180 mg total) by mouth as needed., Disp: 30 tablet, Rfl: 5 .  hyoscyamine (LEVSIN) 0.125 MG tablet, Take 1 tablet (0.125 mg total) by mouth every 4 (four) hours as needed., Disp: 30 tablet, Rfl: 2 .  omeprazole (PRILOSEC) 40 MG capsule, Take 1 capsule (40 mg total) by mouth daily., Disp: 30 capsule, Rfl: 0 .  dexlansoprazole (DEXILANT) 60 MG capsule, Take 1 capsule (60 mg total) by mouth daily with breakfast., Disp: 30 capsule, Rfl: 1 .  fluticasone (FLONASE) 50 MCG/ACT nasal spray, instill 2 spray into each nostril at bedtime as needed (Patient not taking: Reported on 12/01/2019), Disp: 16 g, Rfl: 2  Allergies  Allergen Reactions  . Apple Hives, Shortness Of Breath and Swelling  . Avocado Hives, Shortness Of Breath and Swelling    and raw fruit and vegetables  . Daucus Carota Hives, Shortness Of Breath and Swelling  . Other Hives, Shortness Of Breath and  Swelling    Pears, avacados, nuts    I personally reviewed active problem list, medication list, allergies, family history, social history, health maintenance with the patient/caregiver today.   ROS  Constitutional: Negative for fever or weight change.  Respiratory: Negative for cough and shortness of breath.   Cardiovascular: Negative for chest pain or palpitations.  Gastrointestinal: Negative for abdominal pain, no bowel changes.  Musculoskeletal: Negative for gait problem or joint swelling.  Skin: Negative for rash.  Neurological: Positive  for dizziness but no  headache.  No other specific complaints in a complete review of systems (except as listed in HPI  above).  Objective  Vitals:   12/01/19 1143  Pulse: 82  Resp: 16  Temp: (!) 96.7 F (35.9 C)  TempSrc: Temporal  SpO2: 97%  Weight: 144 lb 12.8 oz (65.7 kg)  Height: 5\' 2"  (1.575 m)    Body mass index is 26.48 kg/m.  Physical Exam  Constitutional: Patient appears well-developed and well-nourished. Overweight No distress.  HEENT: head atraumatic, normocephalic, pupils equal and reactive to light, ears normal TM, neck supple, oral mucosa not done  Cardiovascular: Normal rate, regular rhythm and normal heart sounds.  No murmur heard. No BLE edema. Pulmonary/Chest: Effort normal and breath sounds normal. No respiratory distress. Abdominal: Soft.  There is no tenderness. Psychiatric: Patient has a normal mood and affect. behavior is normal. Judgment and thought content normal.  PHQ2/9: Depression screen [facility] 2/9 12/01/2019 05/20/2019 11/14/2018 07/15/2018 06/09/2018  Decreased Interest 0 0 0 0 0  Down, Depressed, Hopeless 0 0 0 0 0  PHQ - 2 Score 0 0 0 0 0  Altered sleeping 1 0 0 0 0  Tired, decreased energy 2 0 0 0 0  Change in appetite 0 0 0 0 0  Feeling bad or failure about yourself  0 0 0 0 0  Trouble concentrating 0 0 0 0 0  Moving slowly or fidgety/restless 0 0 0 0 0  Suicidal thoughts 0 0 0 0 0  PHQ-9 Score 3  0 0 0 0  Difficult doing work/chores Somewhat difficult Not difficult at all Not difficult at all Not difficult at all Not difficult at all    phq 9 is negative  GAD 7 : Generalized Anxiety Score 12/01/2019 06/09/2018  Nervous, Anxious, on Edge 1 0  Control/stop worrying 1 0  Worry too much - different things 1 0  Trouble relaxing 0 0  Restless 0 0  Easily annoyed or irritable 0 0  Afraid - awful might happen 0 0  Total GAD 7 Score 3 0  Anxiety Difficulty Not difficult at all Not difficult at all     Fall Risk: Fall Risk  12/01/2019 05/20/2019 11/14/2018 07/15/2018 06/09/2018  Falls in the past year? 0 0 0 Yes No  Number falls in past yr: 0 0 0 1 -  Injury with Fall? 0 0 0 Yes -  Follow up - Falls evaluation completed Falls evaluation completed Follow up appointment -     Functional Status Survey: Is the patient deaf or have difficulty hearing?: No Does the patient have difficulty seeing, even when wearing glasses/contacts?: Yes Does the patient have difficulty concentrating, remembering, or making decisions?: No Does the patient have difficulty walking or climbing stairs?: No Does the patient have difficulty dressing or bathing?: No Does the patient have difficulty doing errands alone such as visiting a doctor's office or shopping?: No    Assessment & Plan  1. Anxiety  - DULoxetine (CYMBALTA) 30 MG capsule; Take 1-2 capsules (30-60 mg total) by mouth daily.  Dispense: 60 capsule; Refill: 0  2. Left arm numbness  - DULoxetine (CYMBALTA) 30 MG capsule; Take 1-2 capsules (30-60 mg total) by mouth daily.  Dispense: 60 capsule; Refill: 0  3. Left leg weakness  - DULoxetine (CYMBALTA) 30 MG capsule; Take 1-2 capsules (30-60 mg total) by mouth daily.  Dispense: 60 capsule; Refill: 0  4. Vitamin D deficiency  Discussed otc supplementation   5. Irritable bowel syndrome with both constipation and diarrhea   6. Vertigo  Discussed home exercises, rest and discuss with  neurologist  7. Breast cancer screening by mammogram  - MM Digital Screening; Future  8. Hot flashes due to menopause  - DULoxetine (CYMBALTA) 30 MG capsule; Take 1-2 capsules (30-60 mg total) by mouth daily.  Dispense: 60 capsule; Refill: 0  9. Scalp lesion  - Ambulatory referral to Dermatology

## 2019-12-02 NOTE — Telephone Encounter (Signed)
Patient requesting call back regarding.

## 2019-12-04 NOTE — Telephone Encounter (Signed)
Patient called. She did get a call from referrals for the dermatologist appointment. She missed the call. I gave her the number to call and schedule her appointment.

## 2019-12-16 ENCOUNTER — Telehealth: Payer: Self-pay

## 2019-12-16 NOTE — Telephone Encounter (Signed)
Patient is going to go to the ER

## 2019-12-16 NOTE — Telephone Encounter (Signed)
Patient is calling because since Monday she has had sharp chest pain but today they are coming every 3 minutes. Patient states that she is burping a lot more. The pain radiates the her left armpit and upper back. Advised patient she needed to go to the ER to get evacuated. Patient states she will go

## 2019-12-16 NOTE — Telephone Encounter (Signed)
Her symptoms could be secondary to anxiety attack.  If she is already evaluated by the ER and if she still has ongoing symptoms, she can arrange a virtual visit  Helen Green

## 2019-12-30 ENCOUNTER — Encounter: Payer: Self-pay | Admitting: Family Medicine

## 2019-12-30 ENCOUNTER — Other Ambulatory Visit: Payer: Self-pay

## 2019-12-30 ENCOUNTER — Ambulatory Visit (INDEPENDENT_AMBULATORY_CARE_PROVIDER_SITE_OTHER): Payer: 59 | Admitting: Family Medicine

## 2019-12-30 DIAGNOSIS — N951 Menopausal and female climacteric states: Secondary | ICD-10-CM | POA: Diagnosis not present

## 2019-12-30 DIAGNOSIS — F419 Anxiety disorder, unspecified: Secondary | ICD-10-CM

## 2019-12-30 MED ORDER — DULOXETINE HCL 30 MG PO CPEP: 30.0000 mg | ORAL_CAPSULE | Freq: Every day | ORAL | 0 refills | Status: DC

## 2019-12-30 NOTE — Progress Notes (Signed)
Name: Helen Green   MRN: [id number]    DOB: 1966/09/16   Date:12/30/2019       Progress Note  Subjective  Chief Complaint  Chief Complaint  Patient presents with  . Follow-up    I connected with  [name] on 12/30/19 at  8:20 AM EST by telephone and verified that I am speaking with the correct person using two identifiers.  I discussed the limitations, risks, security and privacy concerns of performing an evaluation and management service by telephone and the availability of in person appointments. Staff also discussed with the patient that there may be a patient responsible charge related to this service. Patient Location: at work  Provider Location: [facility]   HPI  Anxiety: she was seen Feb 2021 with complaints of having difficulty leaving things undone, even when she is very tired she is unable to leave the dishes int he sink. 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 She is taking Duloxetine 30 mg now and seems to help her but also making an effort to let things go. Left side weakness has resolved   Hot flashes: she reached menopause about 10 years ago, but she states symptoms wer  getting worse. She states Duloxetine is working well for her, helping with symptoms, only on 30 mg    Patient Active Problem List   Diagnosis Date Noted  . 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 Insomnia, persistent 05/03/2015  . Functional dyspepsia 05/03/2015  . Gastro-esophageal reflux disease without esophagitis 05/03/2015  . History of anemia 05/03/2015  . Perennial allergic rhinitis with seasonal variation 05/03/2015  . 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 Vitamin D deficiency 05/03/2015  . Irritable bowel syndrome with diarrhea 05/03/2015    Past Surgical History:  Procedure Laterality Date  . ABDOMINAL HYSTERECTOMY    . COLONOSCOPY WITH PROPOFOL N/A 09/27/2017   Procedure: COLONOSCOPY WITH PROPOFOL;  Surgeon: Jonathon Bellows, MD;  Location: [facility] ENDOSCOPY;  Service: Gastroenterology;  Laterality: N/A;  . ESOPHAGOGASTRODUODENOSCOPY (EGD) WITH PROPOFOL N/A 09/27/2017   Procedure: ESOPHAGOGASTRODUODENOSCOPY (EGD) WITH PROPOFOL;  Surgeon: Jonathon Bellows, MD;  Location: [facility] ENDOSCOPY;  Service: Gastroenterology;  Laterality: N/A;  . TUBAL LIGATION      Family History  Problem Relation Age of Onset  . Diabetes Mother   . Hyperlipidemia Mother   . Hypertension Mother   . Liver disease Father   . Cancer Brother 4       brain cancer  . Breast cancer Neg Hx     Social History   Tobacco Use  . Smoking status: Never Smoker  . Smokeless tobacco: Never Used  Substance Use Topics  . Alcohol use: No    Alcohol/week: 0.0 standard drinks    Current Outpatient Medications:  .  DULoxetine (CYMBALTA) 30 MG capsule, Take 1-2 capsules (30-60 mg total) by mouth daily., Disp: 60 capsule, Rfl: 0 .  fexofenadine (ALLEGRA ALLERGY) 180 MG tablet, Take 1 tablet (180 mg total) by mouth as needed., Disp: 30 tablet, Rfl: 5 .  fluticasone (FLONASE) 50 MCG/ACT nasal spray, instill 2 spray into each nostril at bedtime as needed, Disp: 16 g, Rfl: 2 .  hyoscyamine (LEVSIN) 0.125 MG tablet, Take 1 tablet (0.125 mg total) by mouth every 4 (four) hours as needed., Disp: 30 tablet, Rfl: 2 .  omeprazole (PRILOSEC) 40 MG capsule, Take 1 capsule (40 mg total) by mouth daily., Disp: 30 capsule, Rfl: 0 .  dexlansoprazole (DEXILANT) 60 MG capsule,  Take 1 capsule (60 mg total) by mouth daily with breakfast., Disp: 30 capsule, Rfl: 1  Allergies  Allergen Reactions  . Apple Hives, Shortness Of Breath and Swelling  . Avocado Hives, Shortness Of Breath and Swelling    and raw fruit and vegetables  . Daucus Carota Hives, Shortness Of Breath and Swelling  . Other Hives, Shortness Of Breath and Swelling    Pears, avacados, nuts    I personally reviewed active problem list, medication list, allergies, family history, social history, health  maintenance with the patient/caregiver today.   ROS  Ten systems reviewed and is negative except as mentioned in HPI   Objective  Virtual encounter, vitals not obtained.  There is no height or weight on file to calculate BMI.  Physical Exam  Awake, alert and oriented  PHQ2/9: Depression screen [facility] 2/9 12/30/2019 12/01/2019 05/20/2019 11/14/2018 07/15/2018  Decreased Interest 0 0 0 0 0  Down, Depressed, Hopeless 0 0 0 0 0  PHQ - 2 Score 0 0 0 0 0  Altered sleeping 0 1 0 0 0  Tired, decreased energy 0 2 0 0 0  Change in appetite 0 0 0 0 0  Feeling bad or failure about yourself  3 0 0 0 0  Trouble concentrating 0 0 0 0 0  Moving slowly or fidgety/restless 0 0 0 0 0  Suicidal thoughts 0 0 0 0 0  PHQ-9 Score 3 3 0 0 0  Difficult doing work/chores - Somewhat difficult Not difficult at all Not difficult at all Not difficult at all   PHQ-2/9 Result is negative.    GAD 7 : Generalized Anxiety Score 12/01/2019 06/09/2018  Nervous, Anxious, on Edge 1 0  Control/stop worrying 1 0  Worry too much - different things 1 0  Trouble relaxing 0 0  Restless 0 0  Easily annoyed or irritable 0 0  Afraid - awful might happen 0 0  Total GAD 7 Score 3 0  Anxiety Difficulty Not difficult at all Not difficult at all     Fall Risk: Fall Risk  12/30/2019 12/01/2019 05/20/2019 11/14/2018 07/15/2018  Falls in the past year? 0 0 0 0 Yes  Number falls in past yr: 0 0 0 0 1  Injury with Fall? 0 0 0 0 Yes  Follow up Falls evaluation completed - Falls evaluation completed Falls evaluation completed Follow up appointment     Assessment & Plan  1. Anxiety  - DULoxetine (CYMBALTA) 30 MG capsule; Take 1 capsule (30 mg total) by mouth daily.  Dispense: 90 capsule; Refill: 0  2. Hot flashes due to menopause  - DULoxetine (CYMBALTA) 30 MG capsule; Take 1 capsule (30 mg total) by mouth daily.  Dispense: 90 capsule; Refill: 0  I discussed the assessment and treatment plan with the patient. The patient was  provided an opportunity to ask questions and all were answered. The patient agreed with the plan and demonstrated an understanding of the instructions.   The patient was advised to call back or seek an in-person evaluation if the symptoms worsen or if the condition fails to improve as anticipated.  I provided 15 minutes of non-face-to-face time during this encounter.  Loistine Chance, MD

## 2020-01-12 ENCOUNTER — Other Ambulatory Visit: Payer: Self-pay

## 2020-01-12 ENCOUNTER — Ambulatory Visit: Payer: 59 | Admitting: Gastroenterology

## 2020-01-12 ENCOUNTER — Encounter: Payer: Self-pay | Admitting: Gastroenterology

## 2020-01-12 VITALS — BP 119/72 | HR 76 | Temp 98.0°F | Wt 143.4 lb

## 2020-01-12 DIAGNOSIS — K5909 Other constipation: Secondary | ICD-10-CM

## 2020-01-12 DIAGNOSIS — K30 Functional dyspepsia: Secondary | ICD-10-CM

## 2020-01-12 MED ORDER — SUCRALFATE 1 G PO TABS: 2.0000 g | ORAL_TABLET | Freq: Three times a day (TID) | ORAL | 0 refills | Status: DC | PRN

## 2020-01-12 NOTE — Progress Notes (Signed)
Cephas Darby, MD [address]  [place]  [place], [place] [address]  Main: [phone]  Fax: [phone] Pager: [phone]   Primary Care Physician: Steele Sizer, MD  Primary Gastroenterologist:  Dr. Cephas Darby  Chief Complaint  Patient presents with  . Gastroesophageal Reflux    Burning in thoat, acid reflex and nausea   . Constipation    HPI: Helen Green is a 54 y.o. female, Here for follow-up of chronic left-sided abdominal pain. Patient was previously seen by Dr. Vicente Males, underwent imaging and endoscopy to evaluation for chronic abdominal pain which were unremarkable. She continues to have chronic left-sided abdominal pain associated with bloating. She continues to have constipation, straining and hard stools. She reports that her abdominal pain occurs mostly at night interfering with her sleep, with significant gas and burping. She has been taking hyoscyamine as needed which provides some relief. Bentyl has not helped. She switched from omeprazole to Pepcid for heartburn as omeprazole did not help.  She was sent to me to discuss about rectal bleeding and possible hemorrhoid ligation has her colonoscopy was normal. She was seen by Dr. Ancil Boozer about a month ago due to three-months history of severe rectal burning/sharp pain and bleeding that resulted after straining to have a bowel movement. She was prescribed Anusol and nitroglycerin ointment along with Colace. She reports that the nitroglycerin ointment significantly helped and her symptoms resolved.  She went to [facility] ER yesterday due to sudden onset of severe low back pain. She was returning from [facility] yesterday by flight, suddenly experienced low back pain in the airport and dropped down to the floor. She went to ER right away. She was seen by PA, the symptoms were attributed to lumbar strain and patient's pain improved with Toradol and Robaxin in the ER. She was discharged home  on Toradol, Flexeril. She is accompanied by her husband to the office today and walking uncomfortable due to ongoing low back pain.  Her main concern today is ongoing left-sided abdominal pain, bloating and heartburn  Follow-up visit 08/08/2018 Patient is taking hyoscyamine 0.375 mg once a day, at bedtime which helps her sleep without abdominal discomfort.  She has not tried twice daily.  She has been bothered by her hemorrhoid symptoms.  She reports that her stools are fairly regular and does alternate between constipation and diarrhea.  Follow-up visit 12/04/2018 She made an urgent visit today to see me due to 3 weeks of worsening of severe rectal pain, every time she has a bowel movement, reports that its ripping sensation.  She has history of anal fissure for which I started her on Nitropaste.  She felt significant relief using it, however discontinued.  Now with recurrence of pain, she just started using it once a day for the last 2 days.  She reports that the bleeding has subsided but continues to have severe pain.  I perform hemorrhoid ligation for symptomatic hemorrhoids few months ago.  She reports that hyoscyamine is helping with her IBS symptoms.  Follow-up visit 03/03/2019 She reports feeling better but still having intermittent flareups.  She continues to use topical nifedipine about 2 times a day.  Denies constipation, pushing or straining.  She just started taking sitz baths which provides good relief.  She works on the computer for long hours sitting, she is trying to get a standing desk.  She does notice scant amount of blood on wiping intermittently  Follow-up visit 06/30/2019 She reports having flareup  of epigastric pain associated with heartburn as well as left lower quadrant discomfort.  Her anal fissure has completely resolved.  Her constipation is manageable.  She ran out of hyoscyamine and requesting refill.  She is currently taking omeprazole 20 mg OTC for heartburn and epigastric  pain which provides modest relief only.  She states she has been very busy at work.  Follow-up visit 01/12/2020 Patient is here for follow-up of nonulcer dyspepsia symptoms include epigastric pain, frequent burping particularly at night, abdominal bloating, constipation.  She tried Dexilant does have given.  Does not remember if it helped.  She is currently on Prilosec 20 mg as well as Pepcid as needed.  She is particularly experiencing abdominal pain and bloating when she goes to bed.  She is currently constipated and does home remedies regular bowel movements   Current Outpatient Medications:  .  DULoxetine (CYMBALTA) 30 MG capsule, Take 1 capsule (30 mg total) by mouth daily., Disp: 90 capsule, Rfl: 0 .  fexofenadine (ALLEGRA ALLERGY) 180 MG tablet, Take 1 tablet (180 mg total) by mouth as needed., Disp: 30 tablet, Rfl: 5 .  fluticasone (FLONASE) 50 MCG/ACT nasal spray, instill 2 spray into each nostril at bedtime as needed, Disp: 16 g, Rfl: 2 .  hyoscyamine (LEVSIN) 0.125 MG tablet, Take 1 tablet (0.125 mg total) by mouth every 4 (four) hours as needed., Disp: 30 tablet, Rfl: 2 .  omeprazole (PRILOSEC) 40 MG capsule, Take 1 capsule (40 mg total) by mouth daily., Disp: 30 capsule, Rfl: 0 .  dexlansoprazole (DEXILANT) 60 MG capsule, Take 1 capsule (60 mg total) by mouth daily with breakfast. (Patient not taking: Reported on 01/12/2020), Disp: 30 capsule, Rfl: 1 .  sucralfate (CARAFATE) 1 g tablet, Take 2 tablets (2 g total) by mouth 3 (three) times daily as needed for up to 30 doses., Disp: 60 tablet, Rfl: 0   Allergies as of 01/12/2020 - Review Complete 01/12/2020  Allergen Reaction Noted  . Apple Hives, Shortness Of Breath, and Swelling 05/03/2015  . Avocado Hives, Shortness Of Breath, and Swelling 05/03/2015  . Daucus carota Hives, Shortness Of Breath, and Swelling 07/30/2014  . Other Hives, Shortness Of Breath, and Swelling 05/03/2015    NSAIDs: none  Antiplts/Anticoagulants/Anti  thrombotics: none  GI procedures:  EGD and colonoscopy by Dr. Vicente Males 09/27/2017  - Normal examined duodenum. - Normal esophagus. - Normal stomach. Biopsied.  - One 3 mm polyp in the transverse colon, removed with a cold biopsy forceps. Resected and retrieved. - The examination was otherwise normal on direct and retroflexion views.  DIAGNOSIS:  A. STOMACH; COLD BIOPSY:  - ANTRAL MUCOSA WITH MILD CHRONIC GASTRITIS.  - NEGATIVE FOR H. PYLORI, DYSPLASIA AND MALIGNANCY.   B. COLON POLYP, TRANSVERSE; COLD BIOPSY:  - TUBULAR ADENOMA.  - NEGATIVE FOR HIGH-GRADE DYSPLASIA AND MALIGNANCY.   ROS:  General: Negative for anorexia, weight loss, fever, chills, fatigue, weakness. ENT: Negative for hoarseness, difficulty swallowing , nasal congestion. CV: Negative for chest pain, angina, palpitations, dyspnea on exertion, peripheral edema.  Respiratory: Negative for dyspnea at rest, dyspnea on exertion, cough, sputum, wheezing.  GI: See history of present illness. GU:  Negative for dysuria, hematuria, urinary incontinence, urinary frequency, nocturnal urination.  Endo: Negative for unusual weight change.    Physical Examination:   BP 119/72 (BP Location: Left Arm, Patient Position: Sitting, Cuff Size: Normal)   Pulse 76   Temp 98 F (36.7 C) (Oral)   Wt 143 lb 6 oz (65 kg)  BMI 26.22 kg/m   General: Well-nourished, well-developed in no acute distress.  Eyes: No icterus. Conjunctivae pink. Mouth: Oropharyngeal mucosa moist and pink , no lesions erythema or exudate. Lungs: Clear to auscultation bilaterally. Non-labored. Heart: Regular rate and rhythm, no murmurs rubs or gallops.  Abdomen: Bowel sounds are normal, nontender, nondistended, no hepatosplenomegaly or masses, no hernia , no rebound or guarding.   Rectum: Mild tenderness in the posterior anal canal, moderate tenderness in the anterior anal canal consistent with anal fissure Extremities: No lower extremity edema. No clubbing  or deformities. Neuro: Alert and oriented x 3.  Grossly intact. Skin: Warm and dry, no jaundice.   Psych: Alert and cooperative, normal mood and affect.   Imaging Studies: reviewed  Assessment and Plan:   Helen Green is a 55 y.o. Hispanic female chronic upper abdominal pain, burping, abdominal bloating and constipation consistent with constipation predominant IBS.   IBS-C: Colonoscopy and CT A/P unremarkable Continue high-fiber diet Continue hyoscyamine 0.125 mg at bedtime for chronic left lower quadrant abdominal pain Trial of Linzess 72 MCG daily Discussed about kiwi fruit 2 times a day  Nonulcer dyspepsia EGD unremarkable, no evidence of H. Pylori TTG IgA unremarkable Wean off PPI, advised to take only Pepcid as needed Trial of activated charcoal 3 times a day, advised her to take this separately and not combine with other medications as it interferes with absorption FDguard samples provided Sucralfate 2gm 3 times daily as needed Perform hydrogen breath test  Chronic anal fissure Resolved  Tubular adenoma colon: Surveillance colonoscopy in 09/2022  Follow up in 3 months or sooner if needed  Dr [name], MD

## 2020-01-12 NOTE — Patient Instructions (Addendum)
1. Try activated charcoal 1tab three times daily without any other medications for atleast an hour 2. FD guard 3. Sucralfate TID prn   Please call our office to speak with my nurse Ulyess Blossom [phone] during business hours from 8am to 4pm if you have any questions/concerns. During after hours, you will be redirected to on call GI physician. For any emergency please call 911 or go the nearest emergency room.    Cephas Darby, MD [address]  [place]  [place], [place] [address]  Main: [phone]  Fax: [phone]

## 2020-01-27 ENCOUNTER — Telehealth: Payer: Self-pay | Admitting: Gastroenterology

## 2020-01-27 NOTE — Telephone Encounter (Signed)
Patient calling about there H pylori that will come in the mail?

## 2020-01-28 NOTE — Telephone Encounter (Signed)
Patient states that she has not received the Test she was supposed to received in the mail. Informed patient that I would follow up with the company and get back with her

## 2020-01-28 NOTE — Telephone Encounter (Signed)
Delivered 01/26/2020 at 3:30pm. Tracking number is with [employer]  [id number]. informed patient of this information. Patient states that she never opens [employer] packages because they are usually for her son. She will look for it when she gets home

## 2020-02-01 ENCOUNTER — Telehealth: Payer: Self-pay

## 2020-02-01 NOTE — Telephone Encounter (Signed)
Copied from [place] [id number]. Topic: Appointment Scheduling - Scheduling Inquiry for Clinic >> Feb 01, 2020  8:11 AM Virl Axe D wrote: Reason for CRM: Pt called to schedule appt with Dr. Ancil Boozer about an upcoming procedure with the dermatologist she referred her to. Pt stated she would like to speak with Dr. Ancil Boozer prior to procedure on 02/10/20 however no appts available before then. Pt would like to know if Dr. Ancil Boozer can call her if she cannot be seen prior to 02/10/20. Please advise. >> Feb 01, 2020  8:35 AM [place], Marland Kitchen wrote: lvmf or scheduling

## 2020-02-01 NOTE — Telephone Encounter (Signed)
Copied from [place] [id number]. Topic: General - Other >> Jan 29, 2020  4:17 PM Greggory Keen D wrote: Reason for CRM: Pt called saying she had a test come back from her dermatology for a place on her scalp that came back positive.  She would like Dr. Ancil Boozer to call her back about the procedure she needs to have done.    CB#  [phone]

## 2020-02-02 NOTE — Telephone Encounter (Signed)
Called patient. She will call and have records released to you.

## 2020-02-04 ENCOUNTER — Encounter: Payer: Self-pay | Admitting: Family Medicine

## 2020-02-04 ENCOUNTER — Ambulatory Visit (INDEPENDENT_AMBULATORY_CARE_PROVIDER_SITE_OTHER): Payer: 59 | Admitting: Family Medicine

## 2020-02-04 ENCOUNTER — Other Ambulatory Visit: Payer: Self-pay

## 2020-02-04 DIAGNOSIS — C4441 Basal cell carcinoma of skin of scalp and neck: Secondary | ICD-10-CM

## 2020-02-04 NOTE — Progress Notes (Signed)
Name: Helen Green   MRN: [id number]    DOB: 09-26-1966   Date:02/04/2020       Progress Note  Subjective  Chief Complaint  Chief Complaint  Patient presents with  . Consult    Dermatologist Referral and wants to go over the results.    I connected with  Helen Green on 02/04/20 at 10:00 AM EDT by telephone and verified that I am speaking with the correct person using two identifiers.   I discussed the limitations, risks, security and privacy concerns of performing an evaluation and management service by telephone and the availability of in person appointments. Staff also discussed with the patient that there may be a patient responsible charge related to this service. Patient Location: at work  Provider Location: [facility]   HPI  Basal Cell Carcinoma scalp: she had a shave biopsy on 01/21/2020 that showed basal cancer carcinoma, nodular type. It was done by Dr. Martin Majestic. She is worried about having it spread all over her body. Explained that basal cell is usually localized, there is not need for imaging at this time. She will go back for excisional biopsy on 02/10/2020    Patient Active Problem List   Diagnosis Date Noted  . 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 Insomnia, persistent 05/03/2015  . Functional dyspepsia 05/03/2015  . Gastro-esophageal reflux disease without esophagitis 05/03/2015  . Perennial allergic rhinitis with seasonal variation 05/03/2015  . 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 Vitamin D deficiency 05/03/2015  . Irritable bowel syndrome with diarrhea 05/03/2015    Social History   Tobacco Use  . Smoking status: Never Smoker  . Smokeless tobacco: Never Used  Substance Use Topics  . Alcohol use: No    Alcohol/week: 0.0 standard drinks     Current Outpatient Medications:  .  DULoxetine (CYMBALTA) 30 MG capsule, Take 1 capsule (30 mg total) by mouth daily., Disp: 90 capsule, Rfl: 0 .  fexofenadine (ALLEGRA ALLERGY) 180  MG tablet, Take 1 tablet (180 mg total) by mouth as needed., Disp: 30 tablet, Rfl: 5 .  fluticasone (FLONASE) 50 MCG/ACT nasal spray, instill 2 spray into each nostril at bedtime as needed, Disp: 16 g, Rfl: 2 .  hyoscyamine (LEVSIN) 0.125 MG tablet, Take 1 tablet (0.125 mg total) by mouth every 4 (four) hours as needed., Disp: 30 tablet, Rfl: 2 .  sucralfate (CARAFATE) 1 g tablet, Take 2 tablets (2 g total) by mouth 3 (three) times daily as needed for up to 30 doses., Disp: 60 tablet, Rfl: 0  Allergies  Allergen Reactions  . Apple Hives, Shortness Of Breath and Swelling  . Avocado Hives, Shortness Of Breath and Swelling    and raw fruit and vegetables  . Daucus Carota Hives, Shortness Of Breath and Swelling  . Other Hives, Shortness Of Breath and Swelling    Pears, avacados, nuts    I personally reviewed active problem list, medication list, allergies, family history, social history, health maintenance with the patient/caregiver today.  ROS  Ten systems reviewed and is negative except as mentioned in HPI   Objective  Virtual encounter, vitals not obtained.  There is no height or weight on file to calculate BMI.  Nursing Note and Vital Signs reviewed.  Physical Exam  Awake, alert and oriented   Assessment & Plan  1. Basal cell carcinoma of scalp  Keep follow up with dermatologist , she will have Mohs procedure    -Red flags and when to present for emergency  care or RTC including fever >101.36F, chest pain, shortness of breath, new/worsening/un-resolving symptoms,  reviewed with patient at time of visit. Follow up and care instructions discussed and provided in AVS. - I discussed the assessment and treatment plan with the patient. The patient was provided an opportunity to ask questions and all were answered. The patient agreed with the plan and demonstrated an understanding of the instructions.  - The patient was advised to call back or seek an in-person evaluation if the  symptoms worsen or if the condition fails to improve as anticipated.  I provided 15 minutes of non-face-to-face time during this encounter.  Loistine Chance, MD

## 2020-02-11 ENCOUNTER — Ambulatory Visit: Payer: 59 | Admitting: Family Medicine

## 2020-02-24 ENCOUNTER — Telehealth: Payer: Self-pay | Admitting: Gastroenterology

## 2020-02-24 NOTE — Telephone Encounter (Signed)
Helen Green from [facility] calling to let Dr. Marius Ditch know that the pts breath test report came back negative and go over any questions you or the pt may have. Helen Green states even tho the test was negative, gas values were low and that could be caused from the pt not drinking all the glucose. Best call back number for Helen Green is [phone].

## 2020-02-25 NOTE — Telephone Encounter (Signed)
Patient verbalized understanding of results  

## 2020-02-25 NOTE — Telephone Encounter (Signed)
The test for bacterial overgrowth came back normal  RV

## 2020-03-02 ENCOUNTER — Ambulatory Visit: Payer: 59 | Admitting: Family Medicine

## 2020-03-02 ENCOUNTER — Encounter: Payer: Self-pay | Admitting: Family Medicine

## 2020-03-02 ENCOUNTER — Other Ambulatory Visit: Payer: Self-pay

## 2020-03-02 ENCOUNTER — Ambulatory Visit
Admission: RE | Admit: 2020-03-02 | Discharge: 2020-03-02 | Disposition: A | Discharge status: Home / Self Care | Payer: 59 | Source: Ambulatory Visit | Attending: Family Medicine | Admitting: Family Medicine

## 2020-03-02 VITALS — BP 122/70 | HR 93 | Temp 97.8°F | Resp 16 | Ht 62.0 in | Wt 143.4 lb

## 2020-03-02 DIAGNOSIS — K219 Gastro-esophageal reflux disease without esophagitis: Secondary | ICD-10-CM | POA: Diagnosis not present

## 2020-03-02 DIAGNOSIS — M545 Low back pain, unspecified: Secondary | ICD-10-CM

## 2020-03-02 DIAGNOSIS — N951 Menopausal and female climacteric states: Secondary | ICD-10-CM

## 2020-03-02 DIAGNOSIS — F419 Anxiety disorder, unspecified: Secondary | ICD-10-CM | POA: Diagnosis not present

## 2020-03-02 MED ORDER — DULOXETINE HCL 30 MG PO CPEP: 30.0000 mg | ORAL_CAPSULE | Freq: Every day | ORAL | 0 refills | Status: DC

## 2020-03-02 MED ORDER — METAXALONE 400 MG PO TABS: 400.0000 mg | ORAL_TABLET | Freq: Three times a day (TID) | ORAL | 0 refills | Status: DC | PRN

## 2020-03-02 NOTE — Progress Notes (Signed)
Name: Helen Green   MRN: [id number]    DOB: 1966/04/18   Date:03/02/2020       Progress Note  Subjective  Chief Complaint  Chief Complaint  Patient presents with  . Muscle Pain    lower back would like a xray    HPI  Intermittent low back pain: she states she is concerned because this last episode happened when she gently reached to the trash can about 1 week ago . It caused pain a spasm on left lower back described as tightness . There was not associated with radiculitis to her left lower leg. The pain was intense with initial movement but would subside afterwards. No rashes. No dysuria, fever or chills. Pain at this time is zero. She would like to have a x-ray, explained not really indicated and it may show arthritis but the treatment would not change. Discussed adding a muscle relaxer. She is taking Tylenol   Anxiety: she was seen Feb 2021 with complaints of having difficulty leaving things undone, even when she is very tired she is unable to leave the dishes int he sink. 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 She is taking Duloxetine 30 mg and is still doing well on medication   Hot flashes: she reached menopause about 10 years ago, she started on duloxetine Feb 2021 and is doing well now, hot flashes not as intense  GERD: she is not taking medication at this time, states was on antibiotics , but will resume now , having some epigastric pain and heartburn again   Patient Active Problem List   Diagnosis Date Noted  . 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 Insomnia, persistent 05/03/2015  . Functional dyspepsia 05/03/2015  . Gastro-esophageal reflux disease without esophagitis 05/03/2015  . Perennial allergic rhinitis with seasonal variation 05/03/2015  . 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 Vitamin D deficiency 05/03/2015  . Irritable bowel syndrome with diarrhea 05/03/2015    Past Surgical History:  Procedure Laterality Date  . ABDOMINAL  HYSTERECTOMY    . COLONOSCOPY WITH PROPOFOL N/A 09/27/2017   Procedure: COLONOSCOPY WITH PROPOFOL;  Surgeon: Jonathon Bellows, MD;  Location[facility] ENDOSCOPY;  Service: Gastroenterology;  Laterality: N/A;  . ESOPHAGOGASTRODUODENOSCOPY (EGD) WITH PROPOFOL N/A 09/27/2017   Procedure: ESOPHAGOGASTRODUODENOSCOPY (EGD) WITH PROPOFOL;  Surgeon: Jonathon Bellows, MD;  Location: [facility] ENDOSCOPY;  Service: Gastroenterology;  Laterality: N/A;  . TUBAL LIGATION      Family History  Problem Relation Age of Onset  . Diabetes Mother   . Hyperlipidemia Mother   . Hypertension Mother   . Liver disease Father   . Cancer Brother 68       brain cancer  . Breast cancer Neg Hx     Social History   Tobacco Use  . Smoking status: Never Smoker  . Smokeless tobacco: Never Used  Substance Use Topics  . Alcohol use: No    Alcohol/week: 0.0 standard drinks     Current Outpatient Medications:  .  DULoxetine (CYMBALTA) 30 MG capsule, Take 1 capsule (30 mg total) by mouth daily., Disp: 90 capsule, Rfl: 0 .  fexofenadine (ALLEGRA ALLERGY) 180 MG tablet, Take 1 tablet (180 mg total) by mouth as needed., Disp: 30 tablet, Rfl: 5 .  fluticasone (FLONASE) 50 MCG/ACT nasal spray, instill 2 spray into each nostril at bedtime as needed, Disp: 16 g, Rfl: 2 .  hyoscyamine (LEVSIN) 0.125 MG tablet, Take 1 tablet (0.125 mg total) by mouth every 4 (four) hours as needed.,  Disp: 30 tablet, Rfl: 2 .  sucralfate (CARAFATE) 1 g tablet, Take 2 tablets (2 g total) by mouth 3 (three) times daily as needed for up to 30 doses., Disp: 60 tablet, Rfl: 0  Allergies  Allergen Reactions  . Apple Hives, Shortness Of Breath and Swelling  . Avocado Hives, Shortness Of Breath and Swelling    and raw fruit and vegetables  . Daucus Carota Hives, Shortness Of Breath and Swelling  . Other Hives, Shortness Of Breath and Swelling    Pears, avacados, nuts    I personally reviewed active problem list, medication list, allergies, family history, social  history, health maintenance with the patient/caregiver today.   ROS  Constitutional: Negative for fever or weight change.  Respiratory: Negative for cough and shortness of breath.   Cardiovascular: Negative for chest pain or palpitations.  Gastrointestinal: Negative for abdominal pain, no bowel changes.  Musculoskeletal: Negative for gait problem or joint swelling.  Skin: Negative for rash.  Neurological: Negative for dizziness or headache.  No other specific complaints in a complete review of systems (except as listed in HPI above).  Objective  Vitals:   03/02/20 1106  BP: 122/70  Pulse: 93  Resp: 16  Temp: 97.8 F (36.6 C)  TempSrc: Temporal  SpO2: 98%  Weight: 143 lb 6.4 oz (65 kg)  Height: 5\' 2"  (1.575 m)    Body mass index is 26.23 kg/m.  Physical Exam  Constitutional: Patient appears well-developed and well-nourished.  No distress.  HEENT: head atraumatic, normocephalic, pupils equal and reactive to light Cardiovascular: Normal rate, regular rhythm and normal heart sounds.  No murmur heard. No BLE edema. Pulmonary/Chest: Effort normal and breath sounds normal. No respiratory distress. Abdominal: Soft.  There is no tenderness. Muscular Skeletal: pain during palpation of mid lumbar spine and left lower back, normal rom, negative straight leg raise  Psychiatric: Patient has a normal mood and affect. behavior is normal. Judgment and thought content normal.  PHQ2/9: Depression screen [facility] 2/9 03/02/2020 02/04/2020 12/30/2019 12/01/2019 05/20/2019  Decreased Interest 0 0 0 0 0  Down, Depressed, Hopeless 0 0 0 0 0  PHQ - 2 Score 0 0 0 0 0  Altered sleeping 0 0 0 1 0  Tired, decreased energy 0 0 0 2 0  Change in appetite 0 0 0 0 0  Feeling bad or failure about yourself  0 0 3 0 0  Trouble concentrating 0 0 0 0 0  Moving slowly or fidgety/restless 0 0 0 0 0  Suicidal thoughts 0 0 0 0 0  PHQ-9 Score 0 0 3 3 0  Difficult doing work/chores Not difficult at all - - Somewhat  difficult Not difficult at all  Some recent data might be hidden    phq 9 is negative  Fall Risk: Fall Risk  03/02/2020 02/04/2020 12/30/2019 12/01/2019 05/20/2019  Falls in the past year? 0 0 0 0 0  Number falls in past yr: 0 0 0 0 0  Injury with Fall? 0 0 0 0 0  Follow up Falls evaluation completed - Falls evaluation completed - Falls evaluation completed     Functional Status Survey: Is the patient deaf or have difficulty hearing?: No Does the patient have difficulty seeing, even when wearing glasses/contacts?: No Does the patient have difficulty concentrating, remembering, or making decisions?: No Does the patient have difficulty walking or climbing stairs?: No Does the patient have difficulty dressing or bathing?: No Does the patient have difficulty doing errands alone such  as visiting a doctor's office or shopping?: No    Assessment & Plan  1. Recurrent low back pain  - DG Lumbar Spine Complete; Future - metaxalone (SKELAXIN) 400 MG tablet; Take 1-2 tablets (400-800 mg total) by mouth 3 (three) times daily as needed for muscle spasms.  Dispense: 90 tablet; Refill: 0  She will call back for tizanidine if skelaxin too expensive    2. Gastro-esophageal reflux disease without esophagitis  She will resume Carafate    3. Anxiety  - DULoxetine (CYMBALTA) 30 MG capsule; Take 1 capsule (30 mg total) by mouth daily.  Dispense: 90 capsule; Refill: 0  4. Hot flashes due to menopause  - DULoxetine (CYMBALTA) 30 MG capsule; Take 1 capsule (30 mg total) by mouth daily.  Dispense: 90 capsule; Refill: 0

## 2020-03-02 NOTE — Patient Instructions (Signed)

## 2020-03-03 ENCOUNTER — Other Ambulatory Visit: Payer: Self-pay | Admitting: Family Medicine

## 2020-03-03 DIAGNOSIS — M545 Low back pain, unspecified: Secondary | ICD-10-CM

## 2020-03-03 MED ORDER — BACLOFEN 10 MG PO TABS: 10.0000 mg | ORAL_TABLET | Freq: Three times a day (TID) | ORAL | 0 refills | Status: DC | PRN

## 2020-03-24 ENCOUNTER — Encounter: Payer: Self-pay | Admitting: Family Medicine

## 2020-03-24 ENCOUNTER — Other Ambulatory Visit: Payer: Self-pay

## 2020-03-24 ENCOUNTER — Other Ambulatory Visit (HOSPITAL_COMMUNITY)
Admission: RE | Admit: 2020-03-24 | Discharge: 2020-03-24 | Disposition: A | Discharge status: Home / Self Care | Payer: 59 | Source: Ambulatory Visit | Attending: Family Medicine | Admitting: Family Medicine

## 2020-03-24 ENCOUNTER — Ambulatory Visit (INDEPENDENT_AMBULATORY_CARE_PROVIDER_SITE_OTHER): Payer: 59 | Admitting: Family Medicine

## 2020-03-24 VITALS — BP 120/72 | HR 81 | Temp 98.1°F | Resp 14 | Ht 62.0 in | Wt 144.1 lb

## 2020-03-24 DIAGNOSIS — Z131 Encounter for screening for diabetes mellitus: Secondary | ICD-10-CM

## 2020-03-24 DIAGNOSIS — M545 Low back pain, unspecified: Secondary | ICD-10-CM

## 2020-03-24 DIAGNOSIS — Z862 Personal history of diseases of the blood and blood-forming organs and certain disorders involving the immune mechanism: Secondary | ICD-10-CM

## 2020-03-24 DIAGNOSIS — J069 Acute upper respiratory infection, unspecified: Secondary | ICD-10-CM

## 2020-03-24 DIAGNOSIS — Z0000 Encounter for general adult medical examination without abnormal findings: Secondary | ICD-10-CM

## 2020-03-24 DIAGNOSIS — Z124 Encounter for screening for malignant neoplasm of cervix: Secondary | ICD-10-CM

## 2020-03-24 DIAGNOSIS — Z13220 Encounter for screening for lipoid disorders: Secondary | ICD-10-CM

## 2020-03-24 DIAGNOSIS — K589 Irritable bowel syndrome without diarrhea: Secondary | ICD-10-CM

## 2020-03-24 DIAGNOSIS — E559 Vitamin D deficiency, unspecified: Secondary | ICD-10-CM | POA: Diagnosis not present

## 2020-03-24 DIAGNOSIS — Z1159 Encounter for screening for other viral diseases: Secondary | ICD-10-CM

## 2020-03-24 DIAGNOSIS — Z79899 Other long term (current) drug therapy: Secondary | ICD-10-CM | POA: Diagnosis not present

## 2020-03-24 MED ORDER — ALBUTEROL SULFATE HFA 108 (90 BASE) MCG/ACT IN AERS: 2.0000 | INHALATION_SPRAY | Freq: Four times a day (QID) | RESPIRATORY_TRACT | 0 refills | Status: DC | PRN

## 2020-03-24 MED ORDER — BACLOFEN 10 MG PO TABS: 10.0000 mg | ORAL_TABLET | Freq: Three times a day (TID) | ORAL | 0 refills | Status: DC | PRN

## 2020-03-24 MED ORDER — BENZONATATE 100 MG PO CAPS: 100.0000 mg | ORAL_CAPSULE | Freq: Two times a day (BID) | ORAL | 0 refills | Status: DC | PRN

## 2020-03-24 MED ORDER — TRELEGY ELLIPTA 100-62.5-25 MCG/INH IN AEPB: 1.0000 | INHALATION_SPRAY | Freq: Every day | RESPIRATORY_TRACT | 0 refills | Status: DC

## 2020-03-24 MED ORDER — HYOSCYAMINE SULFATE 0.125 MG PO TABS: 0.1250 mg | ORAL_TABLET | ORAL | 1 refills | Status: DC | PRN

## 2020-03-24 NOTE — Patient Instructions (Addendum)
Ask insurance about Shingrix vaccine  Preventive Care 52-52 Years Old, Female Preventive care refers to visits with your health care provider and lifestyle choices that can promote health and wellness. This includes:  A yearly physical exam. This may also be called an annual well check.  Regular dental visits and eye exams.  Immunizations.  Screening for certain conditions.  Healthy lifestyle choices, such as eating a healthy diet, getting regular exercise, not using drugs or products that contain nicotine and tobacco, and limiting alcohol use. What can I expect for my preventive care visit? Physical exam Your health care provider will check your:  Height and weight. This may be used to calculate body mass index (BMI), which tells if you are at a healthy weight.  Heart rate and blood pressure.  Skin for abnormal spots. Counseling Your health care provider may ask you questions about your:  Alcohol, tobacco, and drug use.  Emotional well-being.  Home and relationship well-being.  Sexual activity.  Eating habits.  Work and work Statistician.  Method of birth control.  Menstrual cycle.  Pregnancy history. What immunizations do I need?  Influenza (flu) vaccine  This is recommended every year. Tetanus, diphtheria, and pertussis (Tdap) vaccine  You may need a Td booster every 10 years. Varicella (chickenpox) vaccine  You may need this if you have not been vaccinated. Zoster (shingles) vaccine  You may need this after age 50. Measles, mumps, and rubella (MMR) vaccine  You may need at least one dose of MMR if you were born in 1957 or later. You may also need a second dose. Pneumococcal conjugate (PCV13) vaccine  You may need this if you have certain conditions and were not previously vaccinated. Pneumococcal polysaccharide (PPSV23) vaccine  You may need one or two doses if you smoke cigarettes or if you have certain conditions. Meningococcal conjugate  (MenACWY) vaccine  You may need this if you have certain conditions. Hepatitis A vaccine  You may need this if you have certain conditions or if you travel or work in places where you may be exposed to hepatitis A. Hepatitis B vaccine  You may need this if you have certain conditions or if you travel or work in places where you may be exposed to hepatitis B. Haemophilus influenzae type b (Hib) vaccine  You may need this if you have certain conditions. Human papillomavirus (HPV) vaccine  If recommended by your health care provider, you may need three doses over 6 months. You may receive vaccines as individual doses or as more than one vaccine together in one shot (combination vaccines). Talk with your health care provider about the risks and benefits of combination vaccines. What tests do I need? Blood tests  Lipid and cholesterol levels. These may be checked every 5 years, or more frequently if you are over 52 years old.  Hepatitis C test.  Hepatitis B test. Screening  Lung cancer screening. You may have this screening every year starting at age 52 if you have a 30-pack-year history of smoking and currently smoke or have quit within the past 15 years.  Colorectal cancer screening. All adults should have this screening starting at age 52 and continuing until age 9. Your health care provider may recommend screening at age 70 if you are at increased risk. You will have tests every 1-10 years, depending on your results and the type of screening test.  Diabetes screening. This is done by checking your blood sugar (glucose) after you have not eaten for a  while (fasting). You may have this done every 1-3 years.  Mammogram. This may be done every 1-2 years. Talk with your health care provider about when you should start having regular mammograms. This may depend on whether you have a family history of breast cancer.  BRCA-related cancer screening. This may be done if you have a family  history of breast, ovarian, tubal, or peritoneal cancers.  Pelvic exam and Pap test. This may be done every 3 years starting at age 52. Starting at age 52, this may be done every 5 years if you have a Pap test in combination with an HPV test. Other tests  Sexually transmitted disease (STD) testing.  Bone density scan. This is done to screen for osteoporosis. You may have this scan if you are at high risk for osteoporosis. Follow these instructions at home: Eating and drinking  Eat a diet that includes fresh fruits and vegetables, whole grains, lean protein, and low-fat dairy.  Take vitamin and mineral supplements as recommended by your health care provider.  Do not drink alcohol if: ? Your health care provider tells you not to drink. ? You are pregnant, may be pregnant, or are planning to become pregnant.  If you drink alcohol: ? Limit how much you have to 0-1 drink a day. ? Be aware of how much alcohol is in your drink. In the [place] one drink equals one 12 oz bottle of beer (355 mL), one 5 oz glass of wine (148 mL), or one 1 oz glass of hard liquor (44 mL). Lifestyle  Take daily care of your teeth and gums.  Stay active. Exercise for at least 30 minutes on 5 or more days each week.  Do not use any products that contain nicotine or tobacco, such as cigarettes, e-cigarettes, and chewing tobacco. If you need help quitting, ask your health care provider.  If you are sexually active, practice safe sex. Use a condom or other form of birth control (contraception) in order to prevent pregnancy and STIs (sexually transmitted infections).  If told by your health care provider, take low-dose aspirin daily starting at age 52. What's next?  Visit your health care provider once a year for a well check visit.  Ask your health care provider how often you should have your eyes and teeth checked.  Stay up to date on all vaccines. This information is not intended to replace advice given to you  by your health care provider. Make sure you discuss any questions you have with your health care provider. Document Revised: 06/19/2018 Document Reviewed: 06/19/2018 Elsevier Patient Education  2020 [employer].

## 2020-03-24 NOTE — Progress Notes (Signed)
Name: Helen Green   MRN: [id number]    DOB: January 26, 1966   Date:03/24/2020       Progress Note  Subjective  Chief Complaint  Chief Complaint  Patient presents with  . Annual Exam  . Cough    x3 days    HPI  Patient presents for annual CPE and URI  URI: she had second COVID 1-9 vaccine two days ago. She states she noticed a scratch throat a few days ago, followed by a tickle on her throat and intermittent dry cough, she states she responds well to albuterol when that happens. She denies fever chills, nausea or vomiting, no body aches or change in appetite. She did not feel great yesterday likely from the vaccine.   Diet: balanced Exercise: reminded her she needs 150 minutes per week   USPSTF grade A and B recommendations    Office Visit from 03/02/2020 in [facility]  AUDIT-C Score  0     Depression: Phq 9 is  negative Depression screen [facility] 2/9 03/24/2020 03/02/2020 02/04/2020 12/30/2019 12/01/2019  Decreased Interest 0 0 0 0 0  Down, Depressed, Hopeless 0 0 0 0 0  PHQ - 2 Score 0 0 0 0 0  Altered sleeping 0 0 0 0 1  Tired, decreased energy 0 0 0 0 2  Change in appetite 0 0 0 0 0  Feeling bad or failure about yourself  0 0 0 3 0  Trouble concentrating 0 0 0 0 0  Moving slowly or fidgety/restless 0 0 0 0 0  Suicidal thoughts 0 0 0 0 0  PHQ-9 Score 0 0 0 3 3  Difficult doing work/chores - Not difficult at all - - Somewhat difficult  Some recent data might be hidden   Hypertension: BP Readings from Last 3 Encounters:  03/24/20 120/72  03/02/20 122/70  01/12/20 119/72   Obesity: Wt Readings from Last 3 Encounters:  03/24/20 144 lb 1.6 oz (65.4 kg)  03/02/20 143 lb 6.4 oz (65 kg)  01/12/20 143 lb 6 oz (65 kg)   BMI Readings from Last 3 Encounters:  03/24/20 26.36 kg/m  03/02/20 26.23 kg/m  01/12/20 26.22 kg/m     Hep C Screening: today  STD testing and prevention (HIV/chl/gon/syphilis): not interested  Intimate partner violence: negative  screen  Sexual History (Partners/Practices/Protection from [employer] hx STI/Pregnancy Plans): married, one partner Pain during Intercourse: no  Menstrual History/LMP/Abnormal Bleeding:  S/p supra cervical hysterectomy  Incontinence Symptoms: no problems  Breast cancer:  - Last Mammogram: reminded her to schedule it  - BRCA gene screening: N/A  Osteoporosis: Discussed high calcium and vitamin D supplementation, weight bearing exercises  Cervical cancer screening: today   Skin cancer: Discussed monitoring for atypical lesions  Colorectal cancer: repeat in 2023  Lung cancer:   Low Dose CT Chest recommended if Age 48-80 years, 30 pack-year currently smoking OR have quit w/in 15years. Patient does not qualify.   ECG: 2011   Advanced Care Planning: A voluntary discussion about advance care planning including the explanation and discussion of advance directives.  Discussed health care proxy and Living will, and the patient was able to identify a health care proxy as husband .  Patient does not have a living will at present time. If patient does have living will, I have requested they bring this to the clinic to be scanned in to their chart.  Lipids: Lab Results  Component Value Date   CHOL 193 09/26/2012   Lab  Results  Component Value Date   HDL 81 (A) 09/26/2012   Lab Results  Component Value Date   LDLCALC 96 09/26/2012   Lab Results  Component Value Date   TRIG 79 09/26/2012   No results found for: CHOLHDL No results found for: LDLDIRECT  Glucose: Glucose  Date Value Ref Range Status  04/19/2017 87 65 - 99 mg/dL Final  04/19/2016 89 65 - 99 mg/dL Final  11/04/2015 94 65 - 99 mg/dL Final   Glucose, Bld  Date Value Ref Range Status  05/20/2019 103 (H) 65 - 99 mg/dL Final    Comment:    .            Fasting reference interval . For someone without known diabetes, a glucose value between 100 and 125 mg/dL is consistent with prediabetes and should be confirmed with  a follow-up test. .     Patient Active Problem List   Diagnosis Date Noted  . 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 Insomnia, persistent 05/03/2015  . Functional dyspepsia 05/03/2015  . Gastro-esophageal reflux disease without esophagitis 05/03/2015  . Perennial allergic rhinitis with seasonal variation 05/03/2015  . 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 Vitamin D deficiency 05/03/2015  . Irritable bowel syndrome with diarrhea 05/03/2015    Past Surgical History:  Procedure Laterality Date  . ABDOMINAL HYSTERECTOMY    . COLONOSCOPY WITH PROPOFOL N/A 09/27/2017   Procedure: COLONOSCOPY WITH PROPOFOL;  Surgeon: Jonathon Bellows, MD;  Location[facility] ENDOSCOPY;  Service: Gastroenterology;  Laterality: N/A;  . ESOPHAGOGASTRODUODENOSCOPY (EGD) WITH PROPOFOL N/A 09/27/2017   Procedure: ESOPHAGOGASTRODUODENOSCOPY (EGD) WITH PROPOFOL;  Surgeon: Jonathon Bellows, MD;  Location: [facility] ENDOSCOPY;  Service: Gastroenterology;  Laterality: N/A;  . TUBAL LIGATION      Family History  Problem Relation Age of Onset  . Diabetes Mother   . Hyperlipidemia Mother   . Hypertension Mother   . Liver disease Father   . Cancer Brother 6       brain cancer  . Breast cancer Neg Hx     Social History   Socioeconomic History  . Marital status: Married    Spouse name: [name]  . Number of children: 2  . Years of education: Not on file  . Highest education level: Associate degree: occupational, Hotel manager, or vocational program  Occupational History  . 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 Smoking status: Never Smoker  . Smokeless tobacco: Never Used  Substance and Sexual Activity  . Alcohol use: No    Alcohol/week: 0.0 standard drinks  . Drug use: No  . Sexual activity: Yes  Other Topics Concern  . Not on file  Social History Narrative   On her second marriage, helping raise her husband's 37 yo niece.      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 Difficulty of Paying Living Expenses: Patient refused  Food Insecurity: No Food Insecurity  . Worried About Charity fundraiser in the Last Year: Never true  . Ran Out of Food in the Last Year: Never true  Transportation Needs: No Transportation Needs  . Lack of Transportation (Medical): No  . Lack of Transportation (Non-Medical): No  Physical Activity: Inactive  . Days of Exercise per Week: 0 days  . Minutes  of Exercise per Session: 0 min  Stress: No Stress Concern Present  . Feeling of Stress : Not at all  Social Connections: Not Isolated  . Frequency of Communication with Friends and Family: More than three times a week  . Frequency of Social Gatherings with Friends and Family: Once a week  . Attends Religious Services: 1 to 4 times per year  . Active Member of Clubs or Organizations: Yes  . Attends Archivist Meetings: More than 4 times per year  . Marital Status: Married  Human resources officer Violence: Not At Risk  . Fear of Current or Ex-Partner: No  . Emotionally Abused: No  . Physically Abused: No  . Sexually Abused: No     Current Outpatient Medications:  .  DULoxetine (CYMBALTA) 30 MG capsule, Take 1 capsule (30 mg total) by mouth daily., Disp: 90 capsule, Rfl: 0 .  fexofenadine (ALLEGRA ALLERGY) 180 MG tablet, Take 1 tablet (180 mg total) by mouth as needed., Disp: 30 tablet, Rfl: 5 .  fluticasone (FLONASE) 50 MCG/ACT nasal spray, instill 2 spray into each nostril at bedtime as needed, Disp: 16 g, Rfl: 2 .  hyoscyamine (LEVSIN) 0.125 MG tablet, Take 1 tablet (0.125 mg total) by mouth every 4 (four) hours as needed., Disp: 30 tablet, Rfl: 2 .  baclofen (LIORESAL) 10 MG tablet, Take 1-2 tablets (10-20 mg total) by mouth 3 (three) times daily as needed for muscle spasms. (Patient not taking: Reported on 03/24/2020), Disp: 60 each, Rfl: 0 .   metaxalone (SKELAXIN) 400 MG tablet, Take 400 mg by mouth daily., Disp: , Rfl:  .  sucralfate (CARAFATE) 1 g tablet, Take 2 tablets (2 g total) by mouth 3 (three) times daily as needed for up to 30 doses. (Patient not taking: Reported on 03/24/2020), Disp: 60 tablet, Rfl: 0  Allergies  Allergen Reactions  . Apple Hives, Shortness Of Breath and Swelling  . Avocado Hives, Shortness Of Breath and Swelling    and raw fruit and vegetables  . Daucus Carota Hives, Shortness Of Breath and Swelling  . Other Hives, Shortness Of Breath and Swelling    Pears, avacados, nuts     ROS  Constitutional: Negative for fever or weight change.  Respiratory: Positive for cough and shortness of breath.   Cardiovascular: Negative for chest pain or palpitations.  Gastrointestinal: positive for intermittent  abdominal pain, no bowel changes.  Musculoskeletal: Negative for gait problem or joint swelling.  Skin: Negative for rash.  Neurological: Negative for dizziness or headache.  No other specific complaints in a complete review of systems (except as listed in HPI above).  Objective  Vitals:   03/24/20 0910  BP: 120/72  Pulse: 81  Resp: 14  Temp: 98.1 F (36.7 C)  TempSrc: Temporal  SpO2: 98%  Weight: 144 lb 1.6 oz (65.4 kg)  Height: '5\' 2"'$  (1.575 m)    Body mass index is 26.36 kg/m.  Physical Exam  Constitutional: Patient appears well-developed and overweight  No distress.  HENT: Head: Normocephalic and atraumatic. Ears: B TMs ok, no erythema or effusion; Nose: not done . Mouth/Throat: not done  Eyes: Conjunctivae and EOM are normal. Pupils are equal, round, and reactive to light. No scleral icterus.  Neck: Normal range of motion. Neck supple. No JVD present. No thyromegaly present.  Cardiovascular: Normal rate, regular rhythm and normal heart sounds.  No murmur heard. No BLE edema. Pulmonary/Chest: Effort normal and breath sounds normal. No respiratory distress. Abdominal: Soft. Bowel sounds  are  normal, no distension. There is no tenderness. no masses Breast: no lumps or masses, no nipple discharge or rashes FEMALE GENITALIA:  External genitalia normal External urethra normal Vaginal vault normal without discharge or lesions Cervix normal without discharge or lesions Bimanual exam normal without masses RECTAL: not done Musculoskeletal: Normal range of motion, no joint effusions. No gross deformities Neurological: he is alert and oriented to person, place, and time. No cranial nerve deficit. Coordination, balance, strength, speech and gait are normal.  Skin: Skin is warm and dry. No rash noted. No erythema.  Psychiatric: Patient has a normal mood and affect. behavior is normal. Judgment and thought content normal.  Fall Risk: Fall Risk  03/24/2020 03/02/2020 02/04/2020 12/30/2019 12/01/2019  Falls in the past year? 0 0 0 0 0  Number falls in past yr: - 0 0 0 0  Injury with Fall? - 0 0 0 0  Follow up - Falls evaluation completed - Falls evaluation completed -    Functional Status Survey: Is the patient deaf or have difficulty hearing?: No Does the patient have difficulty seeing, even when wearing glasses/contacts?: No Does the patient have difficulty concentrating, remembering, or making decisions?: No Does the patient have difficulty walking or climbing stairs?: No Does the patient have difficulty dressing or bathing?: No Does the patient have difficulty doing errands alone such as visiting a doctor's office or shopping?: No  Assessment & Plan  1. Well adult exam  Pap smear collected Advised to call and schedule mammogram   2. Vitamin D deficiency  - VITAMIN D 25 Hydroxy (Vit-D Deficiency, Fractures)  3. Diabetes mellitus screening  - Hemoglobin A1c  4. Lipid screening  - Lipid panel  5. Long-term use of high-risk medication  - COMPLETE METABOLIC PANEL WITH GFR  6. History of anemia  - CBC with Differential/Platelet  7. Need for hepatitis C screening  test  - Hepatitis C antibody  8. Recurrent low back pain  - baclofen (LIORESAL) 10 MG tablet; Take 1-2 tablets (10-20 mg total) by mouth 3 (three) times daily as needed for muscle spasms.  Dispense: 60 each; Refill: 0  9. Viral upper respiratory tract infection  - albuterol (VENTOLIN HFA) 108 (90 Base) MCG/ACT inhaler; Inhale 2 puffs into the lungs every 6 (six) hours as needed for wheezing or shortness of breath.  Dispense: 18 g; Refill: 0 - Fluticasone-Umeclidin-Vilant (TRELEGY ELLIPTA) 100-62.5-25 MCG/INH AEPB; Inhale 1 puff into the lungs daily.  Dispense: 60 each; Refill: 0 - benzonatate (TESSALON) 100 MG capsule; Take 1-2 capsules (100-200 mg total) by mouth 2 (two) times daily as needed.  Dispense: 40 capsule; Refill: 0  10. Irritable bowel syndrome, unspecified type  - hyoscyamine (LEVSIN) 0.125 MG tablet; Take 1 tablet (0.125 mg total) by mouth every 4 (four) hours as needed.  Dispense: 100 tablet; Refill: 1  -USPSTF grade A and B recommendations reviewed with patient; age-appropriate recommendations, preventive care, screening tests, etc discussed and encouraged; healthy living encouraged; see AVS for patient education given to patient -Discussed importance of 150 minutes of physical activity weekly, eat two servings of fish weekly, eat one serving of tree nuts ( cashews, pistachios, pecans, almonds.Marland Kitchen) every other day, eat 6 servings of fruit/vegetables daily and drink plenty of water and avoid sweet beverages.

## 2020-03-25 LAB — CYTOLOGY - PAP
Comment: NEGATIVE
Diagnosis: NEGATIVE
High risk HPV: NEGATIVE

## 2020-03-25 LAB — CBC WITH DIFFERENTIAL/PLATELET
Absolute Monocytes: 628 cells/uL (ref 200–950)
Basophils Absolute: 48 cells/uL (ref 0–200)
Basophils Relative: 0.7 %
Eosinophils Absolute: 428 cells/uL (ref 15–500)
Eosinophils Relative: 6.2 %
HCT: 41.6 % (ref 35.0–45.0)
Hemoglobin: 13.9 g/dL (ref 11.7–15.5)
Lymphs Abs: 2298 cells/uL (ref 850–3900)
MCH: 29 pg (ref 27.0–33.0)
MCHC: 33.4 g/dL (ref 32.0–36.0)
MCV: 86.8 fL (ref 80.0–100.0)
MPV: 9.5 fL (ref 7.5–12.5)
Monocytes Relative: 9.1 %
Neutro Abs: 3498 cells/uL (ref 1500–7800)
Neutrophils Relative %: 50.7 %
Platelets: 272 10*3/uL (ref 140–400)
RBC: 4.79 10*6/uL (ref 3.80–5.10)
RDW: 13.4 % (ref 11.0–15.0)
Total Lymphocyte: 33.3 %
WBC: 6.9 10*3/uL (ref 3.8–10.8)

## 2020-03-25 LAB — COMPLETE METABOLIC PANEL WITH GFR
AG Ratio: 1.6 (calc) (ref 1.0–2.5)
ALT: 13 U/L (ref 6–29)
AST: 13 U/L (ref 10–35)
Albumin: 4.4 g/dL (ref 3.6–5.1)
Alkaline phosphatase (APISO): 64 U/L (ref 37–153)
BUN: 15 mg/dL (ref 7–25)
CO2: 30 mmol/L (ref 20–32)
Calcium: 9.7 mg/dL (ref 8.6–10.4)
Chloride: 103 mmol/L (ref 98–110)
Creat: 0.67 mg/dL (ref 0.50–1.05)
GFR, Est African American: 118 mL/min/{1.73_m2} (ref >=60)
GFR, Est Non African American: 102 mL/min/{1.73_m2} (ref >=60)
Globulin: 2.7 g/dL (calc) (ref 1.9–3.7)
Glucose, Bld: 87 mg/dL (ref 65–99)
Potassium: 4.1 mmol/L (ref 3.5–5.3)
Sodium: 141 mmol/L (ref 135–146)
Total Bilirubin: 0.7 mg/dL (ref 0.2–1.2)
Total Protein: 7.1 g/dL (ref 6.1–8.1)

## 2020-03-25 LAB — HEPATITIS C ANTIBODY
Hepatitis C Ab: NONREACTIVE
SIGNAL TO CUT-OFF: 0 (ref <1.00)

## 2020-03-25 LAB — HEMOGLOBIN A1C
Hgb A1c MFr Bld: 5.4 % of total Hgb (ref <5.7)
Mean Plasma Glucose: 108 (calc)
eAG (mmol/L): 6 (calc)

## 2020-03-25 LAB — LIPID PANEL
Cholesterol: 220 mg/dL — ABNORMAL HIGH (ref <200)
HDL: 72 mg/dL (ref >=50)
LDL Cholesterol (Calc): 126 mg/dL (calc) — ABNORMAL HIGH
Non-HDL Cholesterol (Calc): 148 mg/dL (calc) — ABNORMAL HIGH (ref <130)
Total CHOL/HDL Ratio: 3.1 (calc) (ref <5.0)
Triglycerides: 109 mg/dL (ref <150)

## 2020-03-25 LAB — VITAMIN D 25 HYDROXY (VIT D DEFICIENCY, FRACTURES): Vit D, 25-Hydroxy: 25 ng/mL — ABNORMAL LOW (ref 30–100)

## 2020-06-21 ENCOUNTER — Telehealth: Payer: Self-pay

## 2020-06-21 MED ORDER — DEXLANSOPRAZOLE 60 MG PO CPDR
60.0000 mg | DELAYED_RELEASE_CAPSULE | Freq: Every day | ORAL | 1 refills | Status: DC
Start: 2020-06-21 — End: 2021-01-30

## 2020-06-21 MED ORDER — HYDROCORTISONE (PERIANAL) 2.5 % EX CREA: 1.0000 "application " | TOPICAL_CREAM | Freq: Two times a day (BID) | CUTANEOUS | 0 refills | Status: DC

## 2020-06-21 NOTE — Telephone Encounter (Signed)
Patient is having upper abdominal pain all the way across and radiates to the left side. She states she has a lot of heart burn and acid reflex. Patient is taking zantac over the counter. Patient is having constipation one day and diarrhea another day. Patient is having rectal bleeding with every bowel movement. Patient states the bleeding is just on the toilet paper. I made appointment on 08/09/2020 for patient that was the first available

## 2020-06-21 NOTE — Telephone Encounter (Signed)
Patient verbalized understanding. She state she would like to try Dexilant 60mg . Sent medication to the pharmacy

## 2020-06-21 NOTE — Telephone Encounter (Signed)
Please check with her if she wants to try dexilant 60mg  For rectal bleeding, she can try anusol cream BID for a week  RV

## 2020-06-21 NOTE — Addendum Note (Signed)
Addended by: Ulyess Blossom L on: 06/21/2020 12:51 PM   Modules accepted: Orders

## 2020-07-04 NOTE — Progress Notes (Signed)
Name: Helen Green   MRN: [id number]    DOB: 19-Sep-1966   Date:07/05/2020       Progress Note  Subjective  Chief Complaint  Chief Complaint  Patient presents with  . Anemia  . Anxiety  . Back Pain  . Irritable Bowel Syndrome    HPI  Intermittent low back pain: she continues to have lower back pain, episodes are intermittent, it has been aggravated over the past few weeks because she picked a heavy pot , she states pain is dull 4/10. She still has some baclofen at home.   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 She is taking Duloxetine 30 mg and is still doing well on medication She states now she has been able to leave things undone. Not as obsessed about having dishes done before going to bed.   Hot flashes: she reached menopause about 10 years ago, she started on duloxetine Feb 2021 and is doing well now, hot flashes not as intense, she needs a refill today   GERD: she was off Dexilant but symptoms started to get worse again, with bloating and burning sensation and regurgitation,  so Dr. Marius Ditch called in [place] and she states she is starting to feel better.   IBS with diarrhea/constipation  and hemorrhoids: seen by Dr. Marius Ditch, she had a band but is having recurrence of hemorrhoids and some bleeding, Dr. Marius Ditch recently sent a refill of Anusol HC and hemorrhoids has improved but she is thinking about having surgery again .   RAD: she has a dry cough that can last weeks after an URI, sometimes just during allergy season, she has ventolin at home to use prn, currently no wheezing, cough  or sob  Patient Active Problem List   Diagnosis Date Noted  . Reactive airway disease 07/05/2020  . 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 Insomnia, persistent 05/03/2015  . Functional dyspepsia 05/03/2015  . Gastro-esophageal reflux disease without esophagitis 05/03/2015  . Perennial allergic rhinitis with seasonal variation 05/03/2015  .  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 Vitamin D deficiency 05/03/2015  . Irritable bowel syndrome with diarrhea 05/03/2015    Past Surgical History:  Procedure Laterality Date  . ABDOMINAL HYSTERECTOMY    . COLONOSCOPY WITH PROPOFOL N/A 09/27/2017   Procedure: COLONOSCOPY WITH PROPOFOL;  Surgeon: Jonathon Bellows, MD;  Location: [facility] ENDOSCOPY;  Service: Gastroenterology;  Laterality: N/A;  . ESOPHAGOGASTRODUODENOSCOPY (EGD) WITH PROPOFOL N/A 09/27/2017   Procedure: ESOPHAGOGASTRODUODENOSCOPY (EGD) WITH PROPOFOL;  Surgeon: Jonathon Bellows, MD;  Location: [facility] ENDOSCOPY;  Service: Gastroenterology;  Laterality: N/A;  . TUBAL LIGATION      Family History  Problem Relation Age of Onset  . Diabetes Mother   . Hyperlipidemia Mother   . Hypertension Mother   . Liver disease Father   . Cancer Brother 9       brain cancer  . Breast cancer Neg Hx     Social History   Tobacco Use  . Smoking status: Never Smoker  . Smokeless tobacco: Never Used  Substance Use Topics  . Alcohol use: No    Alcohol/week: 0.0 standard drinks     Current Outpatient Medications:  .  albuterol (VENTOLIN HFA) 108 (90 Base) MCG/ACT inhaler, Inhale 2 puffs into the lungs every 6 (six) hours as needed for wheezing or shortness of breath., Disp: 18 g, Rfl: 0 .  dexlansoprazole (DEXILANT) 60 MG capsule, Take 1 capsule (60 mg total) by mouth daily., Disp: 30  capsule, Rfl: 1 .  DULoxetine (CYMBALTA) 30 MG capsule, Take 1 capsule (30 mg total) by mouth daily., Disp: 90 capsule, Rfl: 0 .  fexofenadine (ALLEGRA ALLERGY) 180 MG tablet, Take 1 tablet (180 mg total) by mouth as needed., Disp: 30 tablet, Rfl: 5 .  fluticasone (FLONASE) 50 MCG/ACT nasal spray, instill 2 spray into each nostril at bedtime as needed, Disp: 16 g, Rfl: 2 .  hydrocortisone (ANUSOL-HC) 2.5 % rectal cream, Place 1 application rectally 2 (two) times daily., Disp: 30 g, Rfl: 0 .  hyoscyamine (LEVSIN) 0.125 MG tablet, Take 1 tablet (0.125 mg  total) by mouth every 4 (four) hours as needed., Disp: 100 tablet, Rfl: 1 .  baclofen (LIORESAL) 10 MG tablet, Take 1-2 tablets (10-20 mg total) by mouth 3 (three) times daily as needed for muscle spasms. (Patient not taking: Reported on 07/05/2020), Disp: 60 each, Rfl: 0  Allergies  Allergen Reactions  . Apple Hives, Shortness Of Breath and Swelling  . Avocado Hives, Shortness Of Breath and Swelling    and raw fruit and vegetables  . Daucus Carota Hives, Shortness Of Breath and Swelling  . Other Hives, Shortness Of Breath and Swelling    Pears, avacados, nuts    I personally reviewed active problem list, medication list, allergies, family history, social history, health maintenance with the patient/caregiver today.   ROS  Constitutional: Negative for fever or weight change.  Respiratory: Negative for cough and shortness of breath.   Cardiovascular: Negative for chest pain or palpitations.  Gastrointestinal: positive  for abdominal pain, and intermittent  bowel changes.  Musculoskeletal: Negative for gait problem or joint swelling.  Skin: Negative for rash.  Neurological: Negative for dizziness or headache.  No other specific complaints in a complete review of systems (except as listed in HPI above).  Objective  Vitals:   07/05/20 0802  BP: 108/70  Pulse: 72  Resp: 16  Temp: 97.8 F (36.6 C)  TempSrc: Oral  SpO2: 99%  Weight: 145 lb 3.2 oz (65.9 kg)  Height: 5\' 2"  (1.575 m)    Body mass index is 26.56 kg/m.  Physical Exam  Constitutional: Patient appears well-developed and well-nourished. Overweight.  No distress.  HEENT: head atraumatic, normocephalic, pupils equal and reactive to light, neck supple Cardiovascular: Normal rate, regular rhythm and normal heart sounds.  No murmur heard. No BLE edema. Pulmonary/Chest: Effort normal and breath sounds normal. No respiratory distress. Abdominal: Soft.  There is no tenderness. Psychiatric: Patient has a normal mood and  affect. behavior is normal. Judgment and thought content normal.  PHQ2/9: Depression screen [facility] 2/9 07/05/2020 03/24/2020 03/02/2020 02/04/2020 12/30/2019  Decreased Interest 0 0 0 0 0  Down, Depressed, Hopeless 0 0 0 0 0  PHQ - 2 Score 0 0 0 0 0  Altered sleeping - 0 0 0 0  Tired, decreased energy - 0 0 0 0  Change in appetite - 0 0 0 0  Feeling bad or failure about yourself  - 0 0 0 3  Trouble concentrating - 0 0 0 0  Moving slowly or fidgety/restless - 0 0 0 0  Suicidal thoughts - 0 0 0 0  PHQ-9 Score - 0 0 0 3  Difficult doing work/chores - - Not difficult at all - -  Some recent data might be hidden    phq 9 is negative   Fall Risk: Fall Risk  07/05/2020 03/24/2020 03/02/2020 02/04/2020 12/30/2019  Falls in the past year? 0 0 0 0 0  Number falls in past yr: 0 - 0 0 0  Injury with Fall? 0 - 0 0 0  Follow up - - Falls evaluation completed - Falls evaluation completed    Functional Status Survey: Is the patient deaf or have difficulty hearing?: No Does the patient have difficulty seeing, even when wearing glasses/contacts?: No Does the patient have difficulty concentrating, remembering, or making decisions?: No Does the patient have difficulty walking or climbing stairs?: No Does the patient have difficulty dressing or bathing?: No Does the patient have difficulty doing errands alone such as visiting a doctor's office or shopping?: No    Assessment & Plan   1. Mild intermittent reactive airway disease without complication  Continue prn medication   2. Anxiety  - DULoxetine (CYMBALTA) 30 MG capsule; Take 1 capsule (30 mg total) by mouth daily.  Dispense: 90 capsule; Refill: 1  3. Hot flashes due to menopause  - DULoxetine (CYMBALTA) 30 MG capsule; Take 1 capsule (30 mg total) by mouth daily.  Dispense: 90 capsule; Refill: 1  4. Irritable bowel syndrome, unspecified type   5. Vitamin D deficiency  Continue otc vitamin D   6. Gastro-esophageal reflux disease without  esophagitis  Continue Dexilant   7. Hemorrhoids, unspecified hemorrhoid type  She will follow up with Dr. Marius Ditch   8. Needs flu shot  Today

## 2020-07-05 ENCOUNTER — Ambulatory Visit (INDEPENDENT_AMBULATORY_CARE_PROVIDER_SITE_OTHER): Payer: 59 | Admitting: Family Medicine

## 2020-07-05 ENCOUNTER — Other Ambulatory Visit: Payer: Self-pay

## 2020-07-05 ENCOUNTER — Encounter: Payer: Self-pay | Admitting: Family Medicine

## 2020-07-05 VITALS — BP 108/70 | HR 72 | Temp 97.8°F | Resp 16 | Ht 62.0 in | Wt 145.2 lb

## 2020-07-05 DIAGNOSIS — E559 Vitamin D deficiency, unspecified: Secondary | ICD-10-CM

## 2020-07-05 DIAGNOSIS — J45909 Unspecified asthma, uncomplicated: Secondary | ICD-10-CM | POA: Insufficient documentation

## 2020-07-05 DIAGNOSIS — F419 Anxiety disorder, unspecified: Secondary | ICD-10-CM

## 2020-07-05 DIAGNOSIS — N951 Menopausal and female climacteric states: Secondary | ICD-10-CM

## 2020-07-05 DIAGNOSIS — K649 Unspecified hemorrhoids: Secondary | ICD-10-CM

## 2020-07-05 DIAGNOSIS — K589 Irritable bowel syndrome without diarrhea: Secondary | ICD-10-CM | POA: Diagnosis not present

## 2020-07-05 DIAGNOSIS — J4520 Mild intermittent asthma, uncomplicated: Secondary | ICD-10-CM | POA: Diagnosis not present

## 2020-07-05 DIAGNOSIS — Z23 Encounter for immunization: Secondary | ICD-10-CM | POA: Diagnosis not present

## 2020-07-05 DIAGNOSIS — K219 Gastro-esophageal reflux disease without esophagitis: Secondary | ICD-10-CM

## 2020-07-05 MED ORDER — DULOXETINE HCL 30 MG PO CPEP: 30.0000 mg | ORAL_CAPSULE | Freq: Every day | ORAL | 1 refills | Status: DC

## 2020-08-09 ENCOUNTER — Other Ambulatory Visit: Payer: Self-pay

## 2020-08-09 ENCOUNTER — Ambulatory Visit: Payer: 59 | Admitting: Gastroenterology

## 2020-08-09 ENCOUNTER — Encounter: Payer: Self-pay | Admitting: Gastroenterology

## 2020-08-09 VITALS — BP 106/71 | HR 71 | Temp 97.8°F | Wt 144.2 lb

## 2020-08-09 DIAGNOSIS — R1013 Epigastric pain: Secondary | ICD-10-CM | POA: Diagnosis not present

## 2020-08-09 DIAGNOSIS — R140 Abdominal distension (gaseous): Secondary | ICD-10-CM | POA: Diagnosis not present

## 2020-08-09 MED ORDER — RIFAXIMIN 550 MG PO TABS
550.0000 mg | ORAL_TABLET | Freq: Three times a day (TID) | ORAL | 0 refills | Status: AC
Start: 2020-08-09 — End: 2020-08-23

## 2020-08-09 NOTE — Progress Notes (Signed)
Cephas Darby, MD [address]  [place]  [place], [place] [address]  Main: [phone]  Fax: [phone] Pager: [phone]   Primary Care Physician: Steele Sizer, MD  Primary Gastroenterologist:  Dr. Cephas Darby  Chief Complaint  Patient presents with  . Gastroesophageal Reflux    Has abdominal pain all over her stomach constant with burning in throat   . Abdominal Pain    Has abdominal pain all over her stomach   . Diarrhea    Has off and on constipation and diarrhea   . Rectal Bleeding    Had rectal bleeding but improved 2 weeks ago     HPI: Helen Green is a 54 y.o. female with history of constipation, functional dyspepsia, reflux, abdominal bloating.  She underwent extensive work-up thus far including bidirectional endoscopy, cross-sectional imaging which were unremarkable.  H. pylori breath test negative, TTG IgA negative.  Patient contacted my office last week of August secondary to severe heartburn, abdominal discomfort, severe bloating as well as rectal bleeding.  I have suggested her to try Dexilant 60 mg daily and also Anusol cream.  She reports that rectal bleeding subsided.  She continues to have severe abdominal bloating and burps frequently whenever she massages her body.  She also reports severe burning in her upper stomach as well as throat.  She is taking Bentyl as needed which provides temporary relief only.  She thinks Dexilant or Prilosec are not helping much.  She is also generally stressed out with her job, it involves frequent travel and also is involved in church activities.  Patient underwent ligation of internal hemorrhoids.  She developed severe pain after second ligation.  She chose not to undergo third ligation.  Current Outpatient Medications:  .  albuterol (VENTOLIN HFA) 108 (90 Base) MCG/ACT inhaler, Inhale 2 puffs into the lungs every 6 (six) hours as needed for wheezing or shortness of breath., Disp: 18 g, Rfl: 0 .   dexlansoprazole (DEXILANT) 60 MG capsule, Take 1 capsule (60 mg total) by mouth daily., Disp: 30 capsule, Rfl: 1 .  DULoxetine (CYMBALTA) 30 MG capsule, Take 1 capsule (30 mg total) by mouth daily., Disp: 90 capsule, Rfl: 1 .  fexofenadine (ALLEGRA ALLERGY) 180 MG tablet, Take 1 tablet (180 mg total) by mouth as needed., Disp: 30 tablet, Rfl: 5 .  fluticasone (FLONASE) 50 MCG/ACT nasal spray, instill 2 spray into each nostril at bedtime as needed, Disp: 16 g, Rfl: 2 .  hydrocortisone (ANUSOL-HC) 2.5 % rectal cream, Place 1 application rectally 2 (two) times daily., Disp: 30 g, Rfl: 0 .  hyoscyamine (LEVSIN) 0.125 MG tablet, Take 1 tablet (0.125 mg total) by mouth every 4 (four) hours as needed., Disp: 100 tablet, Rfl: 1 .  rifaximin (XIFAXAN) 550 MG TABS tablet, Take 1 tablet (550 mg total) by mouth 3 (three) times daily for 14 days., Disp: 42 tablet, Rfl: 0   Allergies as of 08/09/2020 - Review Complete 08/09/2020  Allergen Reaction Noted  . Apple Hives, Shortness Of Breath, and Swelling 05/03/2015  . Avocado Hives, Shortness Of Breath, and Swelling 05/03/2015  . Daucus carota Hives, Shortness Of Breath, and Swelling 07/30/2014  . Other Hives, Shortness Of Breath, and Swelling 05/03/2015    NSAIDs: none  Antiplts/Anticoagulants/Anti thrombotics: none  GI procedures:  EGD and colonoscopy by Dr. Vicente Males 09/27/2017  - Normal examined duodenum. - Normal esophagus. - Normal stomach. Biopsied.  - One 3 mm polyp in the transverse colon, removed with a cold  biopsy forceps. Resected and retrieved. - The examination was otherwise normal on direct and retroflexion views.  DIAGNOSIS:  A. STOMACH; COLD BIOPSY:  - ANTRAL MUCOSA WITH MILD CHRONIC GASTRITIS.  - NEGATIVE FOR H. PYLORI, DYSPLASIA AND MALIGNANCY.   B. COLON POLYP, TRANSVERSE; COLD BIOPSY:  - TUBULAR ADENOMA.  - NEGATIVE FOR HIGH-GRADE DYSPLASIA AND MALIGNANCY.   ROS:  General: Negative for anorexia, weight loss, fever,  chills, fatigue, weakness. ENT: Negative for hoarseness, difficulty swallowing , nasal congestion. CV: Negative for chest pain, angina, palpitations, dyspnea on exertion, peripheral edema.  Respiratory: Negative for dyspnea at rest, dyspnea on exertion, cough, sputum, wheezing.  GI: See history of present illness. GU:  Negative for dysuria, hematuria, urinary incontinence, urinary frequency, nocturnal urination.  Endo: Negative for unusual weight change.    Physical Examination:   BP 106/71 (BP Location: Left Arm, Patient Position: Sitting, Cuff Size: Normal)   Pulse 71   Temp 97.8 F (36.6 C) (Oral)   Wt 144 lb 4 oz (65.4 kg)   BMI 26.38 kg/m   General: Well-nourished, well-developed in no acute distress.  Eyes: No icterus. Conjunctivae pink. Mouth: Oropharyngeal mucosa moist and pink , no lesions erythema or exudate. Lungs: Clear to auscultation bilaterally. Non-labored. Heart: Regular rate and rhythm, no murmurs rubs or gallops.  Abdomen: Bowel sounds are normal, nontender, moderately distended, tympanic to percussion, no hepatosplenomegaly or masses, no hernia , no rebound or guarding.   Rectum: Mild tenderness in the posterior anal canal, moderate tenderness in the anterior anal canal consistent with anal fissure Extremities: No lower extremity edema. No clubbing or deformities. Neuro: Alert and oriented x 3.  Grossly intact. Skin: Warm and dry, no jaundice.   Psych: Alert and cooperative, normal mood and affect.   Imaging Studies: reviewed  Assessment and Plan:   Helen Green is a 54 y.o. Hispanic female is seen for flareup of severe abdominal bloating, abdominal discomfort as well as frequent burping, heartburn   Abdominal bloating Empiric trial of 2 weeks course of rifaximin  Nonulcer dyspepsia EGD unremarkable, no evidence of H. Pylori TTG IgA unremarkable Wean off PPI, advised to take only Pepcid as needed  Chronic anal fissure Resolved  Tubular  adenoma colon: Surveillance colonoscopy in 09/2022  Follow up in 3 months or sooner if needed  Dr [name], MD

## 2020-08-15 ENCOUNTER — Telehealth: Payer: Self-pay

## 2020-08-15 NOTE — Telephone Encounter (Signed)
Tried to call patient but voicemail is full  

## 2020-08-15 NOTE — Telephone Encounter (Signed)
Please give her Xifaxan samples if we have any, 3 times daily for 10 days.  Otherwise, please send her prescription for metronidazole 500 mg twice daily for 10 days  RV

## 2020-08-15 NOTE — Telephone Encounter (Signed)
They denied patient Xifaxan please advised

## 2020-08-15 NOTE — Telephone Encounter (Signed)
Sent PA on Xifaxan 550mg  through cover my medications. Waiting on response from insurance company

## 2020-08-16 NOTE — Telephone Encounter (Signed)
Patient states she will come pick up samples she will pick them up today after 3:30pm. Put samples up front for pick up

## 2020-08-24 ENCOUNTER — Ambulatory Visit: Payer: 59 | Admitting: Family Medicine

## 2020-11-04 ENCOUNTER — Other Ambulatory Visit: Payer: Self-pay

## 2020-11-04 ENCOUNTER — Ambulatory Visit (INDEPENDENT_AMBULATORY_CARE_PROVIDER_SITE_OTHER): Payer: 59 | Admitting: Obstetrics and Gynecology

## 2020-11-04 ENCOUNTER — Encounter: Payer: Self-pay | Admitting: Obstetrics and Gynecology

## 2020-11-04 VITALS — BP 108/72 | HR 84 | Ht 62.0 in | Wt 146.0 lb

## 2020-11-04 DIAGNOSIS — B373 Candidiasis of vulva and vagina: Secondary | ICD-10-CM

## 2020-11-04 DIAGNOSIS — R102 Pelvic and perineal pain: Secondary | ICD-10-CM | POA: Diagnosis not present

## 2020-11-04 DIAGNOSIS — B3731 Acute candidiasis of vulva and vagina: Secondary | ICD-10-CM

## 2020-11-04 DIAGNOSIS — N760 Acute vaginitis: Secondary | ICD-10-CM | POA: Diagnosis not present

## 2020-11-04 LAB — POCT URINALYSIS DIPSTICK
Appearance: NORMAL
Bilirubin, UA: NEGATIVE
Blood, UA: NEGATIVE
Glucose, UA: NEGATIVE
Ketones, UA: NEGATIVE
Leukocytes, UA: NEGATIVE
Nitrite, UA: NEGATIVE
Odor: NORMAL
Protein, UA: NEGATIVE
Spec Grav, UA: 1.01 (ref 1.010–1.025)
Urobilinogen, UA: 0.2 E.U./dL
pH, UA: 6 (ref 5.0–8.0)

## 2020-11-04 MED ORDER — FLUCONAZOLE 150 MG PO TABS: 150.0000 mg | ORAL_TABLET | Freq: Once | ORAL | 0 refills | Status: AC

## 2020-11-04 NOTE — Progress Notes (Signed)
  HPI:      [name] is a 55 y.o. G2P2 who LMP was No LMP recorded. Patient has had a hysterectomy., presents today for a problem visit.  She reports concern for:  Vaginitis:  Patient reports sx of burning at the vaginal opening for the last two weeks. Patient denies discharge but feels there might be a slight change in odor. Vaginal symptoms include burning and local irritation.Vulvar symptoms include none.STI Risk: Very low risk of STD exposure. Discharge described as: scant.Other associated symptoms: patient notes an intensification of burning sx with hot flashes. Patient denies bleeding. Contraception: s/p hysterectomy.  PMHx: She  has a past medical history of Allergy, Chronic abdominal pain, Chronic insomnia, Food allergy, Functional dyspepsia, GERD (gastroesophageal reflux disease), IBS (irritable bowel syndrome), Insomnia, Irritable bowel syndrome with diarrhea, Left arm weakness, Neck pain, Subacute maxillary sinusitis, and Vitamin A deficiency. Also,  has a past surgical history that includes Tubal ligation; Abdominal hysterectomy; Esophagogastroduodenoscopy (egd) with propofol (N/A, 09/27/2017); and Colonoscopy with propofol (N/A, 09/27/2017)., family history includes Cancer (age of onset: 51) in her brother; Diabetes in her mother; Hyperlipidemia in her mother; Hypertension in her mother; Liver disease in her father.,  reports that she has never smoked. She has never used smokeless tobacco. She reports that she does not drink alcohol and does not use drugs.  She has a current medication list which includes the following prescription(s): albuterol, chlorhexidine, dexlansoprazole, duloxetine, fexofenadine, fluticasone, hydrocortisone, and hyoscyamine. Also, is allergic to apple, avocado, daucus carota, and other.  Review of Systems  Constitutional: Negative.   HENT: Negative.   Eyes: Negative.   Respiratory: Negative.   Cardiovascular: Negative.   Gastrointestinal: Positive  for constipation and diarrhea.  Genitourinary: Positive for dysuria.       Buring/irritation at vaginal opening.  Musculoskeletal: Negative.   Skin: Positive for itching.  Neurological: Negative.   Endo/Heme/Allergies: Negative.   Psychiatric/Behavioral: Negative.     Objective: BP 108/72 (BP Location: Left Arm, Patient Position: Sitting, Cuff Size: Normal)   Pulse 84   Ht 5\' 2"  (1.575 m)   Wt 146 lb (66.2 kg)   BMI 26.70 kg/m  Physical Exam Constitutional:      Appearance: Normal appearance.  Genitourinary:     Vulva normal.     Genitourinary Comments: External: Normal appearing vulva. No lesions noted.  Speculum examination: Normal appearing cervix. Scant discharge noted in vaginal vault. Normal, pink vaginal mucosa noted.      No vaginal discharge.  HENT:     Head: Normocephalic.  Eyes:     Pupils: Pupils are equal, round, and reactive to light.  Cardiovascular:     Rate and Rhythm: Normal rate.  Pulmonary:     Effort: Pulmonary effort is normal.  Abdominal:     General: Abdomen is flat.     Palpations: Abdomen is soft.     Tenderness: There is no abdominal tenderness.  Neurological:     Mental Status: She is alert.  Vitals reviewed.    WET PREP:   positive hyphae Findings are consistent with candidal vaginitis  UA dip: negative  ASSESSMENT/PLAN:   54 yo, G2P2 presents with sx consistent with acute vaginitis. Wet mount positive for yeast. Diflucan rx'd with refill for persistent sx. Patient to f/u is sx worsen or persist.  Dysuria - Plan: POCT Urinalysis Dipstick   Candidal vaginitis - Plan: fluconazole (DIFLUCAN) 150 MG tablet   Orlie Pollen, CNM, MSN

## 2020-11-22 ENCOUNTER — Ambulatory Visit: Payer: 59 | Admitting: Gastroenterology

## 2020-12-08 ENCOUNTER — Telehealth: Payer: Self-pay

## 2020-12-08 ENCOUNTER — Other Ambulatory Visit: Payer: Self-pay | Admitting: Family Medicine

## 2020-12-08 DIAGNOSIS — Z1231 Encounter for screening mammogram for malignant neoplasm of breast: Secondary | ICD-10-CM

## 2020-12-08 NOTE — Telephone Encounter (Signed)
Patient called and states that needs to see Dr. Marius Ditch. Patient only wanted to come to [place]. Made appointment for patient on 01/30/2021. She asked if she could get refills till her appointment. I said yes I would send a message to Dr. Marius Ditch. I asked what refills she needed refill on. She states she does not know what she needs refill. She states she will send a mychart message when she figures out

## 2020-12-16 ENCOUNTER — Encounter: Payer: Self-pay | Admitting: Emergency Medicine

## 2020-12-16 ENCOUNTER — Emergency Department: Payer: 59

## 2020-12-16 ENCOUNTER — Emergency Department
Admission: EM | Admit: 2020-12-16 | Discharge: 2020-12-16 | Disposition: A | Discharge status: Home / Self Care | Payer: 59 | Attending: Emergency Medicine | Admitting: Emergency Medicine

## 2020-12-16 ENCOUNTER — Other Ambulatory Visit: Payer: Self-pay

## 2020-12-16 DIAGNOSIS — Z20822 Contact with and (suspected) exposure to covid-19: Secondary | ICD-10-CM | POA: Diagnosis not present

## 2020-12-16 DIAGNOSIS — R531 Weakness: Secondary | ICD-10-CM

## 2020-12-16 DIAGNOSIS — M546 Pain in thoracic spine: Secondary | ICD-10-CM | POA: Diagnosis not present

## 2020-12-16 DIAGNOSIS — R202 Paresthesia of skin: Secondary | ICD-10-CM | POA: Insufficient documentation

## 2020-12-16 DIAGNOSIS — M6281 Muscle weakness (generalized): Secondary | ICD-10-CM | POA: Diagnosis not present

## 2020-12-16 LAB — DIFFERENTIAL
Abs Immature Granulocytes: 0.03 10*3/uL (ref 0.00–0.07)
Basophils Absolute: 0.1 10*3/uL (ref 0.0–0.1)
Basophils Relative: 1 %
Eosinophils Absolute: 0.3 10*3/uL (ref 0.0–0.5)
Eosinophils Relative: 4 %
Immature Granulocytes: 0 %
Lymphocytes Relative: 32 %
Lymphs Abs: 2.3 10*3/uL (ref 0.7–4.0)
Monocytes Absolute: 0.5 10*3/uL (ref 0.1–1.0)
Monocytes Relative: 7 %
Neutro Abs: 4 10*3/uL (ref 1.7–7.7)
Neutrophils Relative %: 56 %

## 2020-12-16 LAB — CBC
HCT: 40.1 % (ref 36.0–46.0)
Hemoglobin: 13.6 g/dL (ref 12.0–15.0)
MCH: 28.9 pg (ref 26.0–34.0)
MCHC: 33.9 g/dL (ref 30.0–36.0)
MCV: 85.1 fL (ref 80.0–100.0)
Platelets: 293 10*3/uL (ref 150–400)
RBC: 4.71 MIL/uL (ref 3.87–5.11)
RDW: 13.3 % (ref 11.5–15.5)
WBC: 7.2 10*3/uL (ref 4.0–10.5)
nRBC: 0 % (ref 0.0–0.2)

## 2020-12-16 LAB — URINALYSIS, COMPLETE (UACMP) WITH MICROSCOPIC
Bilirubin Urine: NEGATIVE
Glucose, UA: NEGATIVE mg/dL
Hgb urine dipstick: NEGATIVE
Ketones, ur: NEGATIVE mg/dL
Leukocytes,Ua: NEGATIVE
Nitrite: NEGATIVE
Protein, ur: NEGATIVE mg/dL
Specific Gravity, Urine: 1.018 (ref 1.005–1.030)
pH: 5 (ref 5.0–8.0)

## 2020-12-16 LAB — COMPREHENSIVE METABOLIC PANEL
ALT: 14 U/L (ref 0–44)
AST: 17 U/L (ref 15–41)
Albumin: 4.5 g/dL (ref 3.5–5.0)
Alkaline Phosphatase: 61 U/L (ref 38–126)
Anion gap: 6 (ref 5–15)
BUN: 17 mg/dL (ref 6–20)
CO2: 28 mmol/L (ref 22–32)
Calcium: 9.7 mg/dL (ref 8.9–10.3)
Chloride: 105 mmol/L (ref 98–111)
Creatinine, Ser: 0.74 mg/dL (ref 0.44–1.00)
GFR, Estimated: >60 mL/min (ref >60)
Glucose, Bld: 98 mg/dL (ref 70–99)
Potassium: 4.2 mmol/L (ref 3.5–5.1)
Sodium: 139 mmol/L (ref 135–145)
Total Bilirubin: 1 mg/dL (ref 0.3–1.2)
Total Protein: 7.6 g/dL (ref 6.5–8.1)

## 2020-12-16 LAB — RESP PANEL BY RT-PCR (FLU A&B, COVID) ARPGX2
Influenza A by PCR: NEGATIVE
Influenza B by PCR: NEGATIVE
SARS Coronavirus 2 by RT PCR: NEGATIVE

## 2020-12-16 LAB — LIPASE, BLOOD: Lipase: 40 U/L (ref 11–51)

## 2020-12-16 LAB — APTT: aPTT: 34 seconds (ref 24–36)

## 2020-12-16 LAB — PROTIME-INR
INR: 1 (ref 0.8–1.2)
Prothrombin Time: 12.2 seconds (ref 11.4–15.2)

## 2020-12-16 LAB — CBG MONITORING, ED: Glucose-Capillary: 71 mg/dL (ref 70–99)

## 2020-12-16 NOTE — ED Provider Notes (Signed)
[facility] Emergency Department Provider Note  ____________________________________________   Event Date/Time   First MD Initiated Contact with Patient 12/16/20 1131     (approximate)  I have reviewed the triage vital signs and the nursing notes.   HISTORY  Chief Complaint Extremity Weakness  HPI Destanie Tibbetts is a 55 y.o. female with a past medical history of chronic abdominal pain, IBS, GERD and prior episodes of left arm weakness of unclear etiology who presents for assessment of left arm weakness numbness and heaviness as well as left leg weakness and numbness and left-sided facial numbness.  Patient states she first noticed the symptoms around 4 PM yesterday.  She states she felt that her left leg weakness has improved and she now feels back to normal as far strength in her left leg but she still has some weakness in the left leg.  She states her weakness in her left arm has significantly improved but she does not feel completely back to normal and still feels heavier than normal.  She denies any vision changes, vertigo, headache, earache, cough, vomiting, urinary symptoms or abdominal pain but does endorse some left-sided shoulder pain and right lower back pain.  These pains seem to have gotten worse over the last 1 to 2 days as well.  No blood in urine, stool, recent traumatic injuries or falls or other acute sick symptoms.  Patient states he has had episodes like this several years ago but never was diagnosed with a stroke or any other neurological problems.  No clearly getting a rating factors.         Past Medical History:  Diagnosis Date  . Allergy   . Chronic abdominal pain   . Chronic insomnia   . Food allergy   . Functional dyspepsia   . GERD (gastroesophageal reflux disease)   . IBS (irritable bowel syndrome)   . Insomnia   . Irritable bowel syndrome with diarrhea   . Left arm weakness   . Neck pain   . Subacute maxillary sinusitis    . Vitamin A deficiency     Patient Active Problem List   Diagnosis Date Noted  . Reactive airway disease 07/05/2020  . 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 Insomnia, persistent 05/03/2015  . Functional dyspepsia 05/03/2015  . Gastro-esophageal reflux disease without esophagitis 05/03/2015  . Perennial allergic rhinitis with seasonal variation 05/03/2015  . 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 Vitamin D deficiency 05/03/2015  . IBS (irritable bowel syndrome) 05/03/2015    Past Surgical History:  Procedure Laterality Date  . ABDOMINAL HYSTERECTOMY    . COLONOSCOPY WITH PROPOFOL N/A 09/27/2017   Procedure: COLONOSCOPY WITH PROPOFOL;  Surgeon: Jonathon Bellows, MD;  Location: [facility] ENDOSCOPY;  Service: Gastroenterology;  Laterality: N/A;  . ESOPHAGOGASTRODUODENOSCOPY (EGD) WITH PROPOFOL N/A 09/27/2017   Procedure: ESOPHAGOGASTRODUODENOSCOPY (EGD) WITH PROPOFOL;  Surgeon: Jonathon Bellows, MD;  Location: [facility] ENDOSCOPY;  Service: Gastroenterology;  Laterality: N/A;  . TUBAL LIGATION      Prior to Admission medications   Medication Sig Start Date End Date Taking? Authorizing Provider  albuterol (VENTOLIN HFA) 108 (90 Base) MCG/ACT inhaler Inhale 2 puffs into the lungs every 6 (six) hours as needed for wheezing or shortness of breath. 03/24/20   Steele Sizer, MD  chlorhexidine (PERIDEX) 0.12 % solution SMARTSIG:By Mouth 08/15/20   [provider]  dexlansoprazole (DEXILANT) 60 MG capsule Take 1 capsule (60 mg total) by mouth daily. 06/21/20   Lin Landsman, MD  DULoxetine (CYMBALTA) 30 MG capsule Take 1 capsule (30 mg total) by mouth daily. 07/05/20   Steele Sizer, MD  fexofenadine [facility] ALLERGY) 180 MG tablet Take 1 tablet (180 mg total) by mouth as needed. 07/12/15   Steele Sizer, MD  fluticasone Asencion [facility]) 50 MCG/ACT nasal spray instill 2 spray into each nostril at bedtime as needed 02/11/18   Steele Sizer, MD  hydrocortisone (ANUSOL-HC) 2.5 % rectal  cream Place 1 application rectally 2 (two) times daily. 06/21/20   Lin Landsman, MD  hyoscyamine (LEVSIN) 0.125 MG tablet Take 1 tablet (0.125 mg total) by mouth every 4 (four) hours as needed. 03/24/20   Steele Sizer, MD    Allergies Apple, Avocado, Daucus carota, and Other  Family History  Problem Relation Age of Onset  . Diabetes Mother   . Hyperlipidemia Mother   . Hypertension Mother   . Liver disease Father   . Cancer Brother 21       brain cancer  . Breast cancer Neg Hx     Social History Social History   Tobacco Use  . Smoking status: Never Smoker  . Smokeless tobacco: Never Used  Vaping Use  . Vaping Use: Never used  Substance Use Topics  . Alcohol use: No    Alcohol/week: 0.0 standard drinks  . Drug use: No    Review of Systems  Review of Systems  Constitutional: Negative for chills and fever.  HENT: Negative for sore throat.   Eyes: Negative for pain.  Respiratory: Negative for cough and stridor.   Cardiovascular: Negative for chest pain.  Gastrointestinal: Negative for vomiting.  Genitourinary: Negative for dysuria.  Musculoskeletal: Negative for myalgias.  Skin: Negative for rash.  Neurological: Positive for sensory change and weakness. Negative for seizures, loss of consciousness and headaches.  Psychiatric/Behavioral: Negative for suicidal ideas.  All other systems reviewed and are negative.     ____________________________________________   PHYSICAL EXAM:  VITAL SIGNS: ED Triage Vitals  Enc Vitals Group     BP 12/16/20 1103 121/89     Pulse Rate 12/16/20 1103 76     Resp 12/16/20 1103 18     Temp 12/16/20 1103 97.6 F (36.4 C)     Temp Source 12/16/20 1103 Oral     SpO2 12/16/20 1103 97 %     Weight 12/16/20 1107 148 lb (67.1 kg)     Height 12/16/20 1107 5\' 3"  (1.6 m)     Head Circumference --      Peak Flow --      Pain Score 12/16/20 1106 8     Pain Loc --      Pain Edu? --      Excl. in [place]? --    Vitals:   12/16/20  1300 12/16/20 1353  BP: 118/70 123/77  Pulse: 63 63  Resp:  18  Temp:    SpO2: 98% 100%   Physical Exam Vitals and nursing note reviewed.  Constitutional:      General: She is not in acute distress.    Appearance: She is well-developed and well-nourished.  HENT:     Head: Normocephalic and atraumatic.     Right Ear: External ear normal.     Left Ear: External ear normal.     Nose: Nose normal.  Eyes:     Conjunctiva/sclera: Conjunctivae normal.  Cardiovascular:     Rate and Rhythm: Normal rate and regular rhythm.     Heart sounds: No murmur heard.   Pulmonary:  Effort: Pulmonary effort is normal. No respiratory distress.     Breath sounds: Normal breath sounds.  Abdominal:     Palpations: Abdomen is soft.     Tenderness: There is no abdominal tenderness.  Musculoskeletal:        General: No edema.     Cervical back: Neck supple.  Skin:    General: Skin is warm and dry.  Neurological:     Mental Status: She is alert and oriented to person, place, and time.  Psychiatric:        Mood and Affect: Mood and affect and mood normal.     Cranial nerves II through XII grossly intact.  No pronator drift.  No finger dysmetria.  Symmetric 5/5 strength of all extremities.  Sensation intact to light touch in all extremities.  Unremarkable unassisted gait.  Patient does report while she can feel this examiner touching her left upper and lower extremities feels "different" than the right side."  Mild tenderness over the mid thoracic muscles ____________________________________________   LABS (all labs ordered are listed, but only abnormal results are displayed)  Labs Reviewed  URINALYSIS, COMPLETE (UACMP) WITH MICROSCOPIC - Abnormal; Notable for the following components:      Result Value   Color, Urine YELLOW (*)    APPearance CLEAR (*)    Bacteria, UA RARE (*)    All other components within normal limits  RESP PANEL BY RT-PCR (FLU A&B, COVID) ARPGX2  PROTIME-INR  APTT   CBC  DIFFERENTIAL  COMPREHENSIVE METABOLIC PANEL  LIPASE, BLOOD  CBG MONITORING, ED   ____________________________________________  EKG  Sinus rhythm with a ventricular rate of 66, normal axis, unremarkable intervals and no clear evidence of acute ischemia or other significant underlying arrhythmia ____________________________________________  RADIOLOGY  ED MD interpretation: Chest x-ray is unremarkable for effusion, edema, thorax, for consolidation or any other clear acute process.  CT head is unremarkable.  MR brain shows no evidence of CVA or other acute process per.   Official radiology report(s): DG Chest 2 View  Result Date: 12/16/2020 CLINICAL DATA:  Back pain. EXAM: CHEST - 2 VIEW COMPARISON:  December 17, 2008. FINDINGS: The heart size and mediastinal contours are within normal limits. Both lungs are clear. No pneumothorax or pleural effusion is noted. The visualized skeletal structures are unremarkable. IMPRESSION: No active cardiopulmonary disease. Electronically Signed   By: Marijo Conception M.D.   On: 12/16/2020 13:44   CT HEAD WO CONTRAST  Result Date: 12/16/2020 CLINICAL DATA:  Left-sided weakness in her arm and leg since 4 o'clock yesterday. Acute stroke suspected. EXAM: CT HEAD WITHOUT CONTRAST TECHNIQUE: Contiguous axial images were obtained from the base of the skull through the vertex without intravenous contrast. COMPARISON:  CT head 05/17/2017. FINDINGS: Brain: There is no evidence of acute intracranial hemorrhage, mass lesion, brain edema or extra-axial fluid collection. The ventricles and subarachnoid spaces are appropriately sized for age. There is no CT evidence of acute cortical infarction. Vascular:  No hyperdense vessel identified. Skull: Negative for fracture or focal lesion. Sinuses/Orbits: The visualized paranasal sinuses and mastoid air cells are clear. No orbital abnormalities are seen. Other: None. IMPRESSION: Stable unremarkable noncontrast head CT.  Electronically Signed   By: Richardean Sale M.D.   On: 12/16/2020 12:17   MR BRAIN WO CONTRAST  Result Date: 12/16/2020 CLINICAL DATA:  Neuro deficit, acute stroke suspected. EXAM: MRI HEAD WITHOUT CONTRAST TECHNIQUE: Multiplanar, multiecho pulse sequences of the brain and surrounding structures were obtained without intravenous contrast. COMPARISON:  CT head December 16, 2020 FINDINGS: Brain: No acute infarction, hemorrhage, hydrocephalus, extra-axial collection or mass lesion. Vascular: Major arterial flow voids are maintained at the skull base. Skull and upper cervical spine: Normal marrow signal. Sinuses/Orbits: Mild paranasal sinus mucosal thickening without air-fluid levels. Unremarkable orbits. Other: No mastoid effusions. IMPRESSION: No evidence of acute intracranial abnormality.  No acute infarct. Electronically Signed   By: Margaretha Sheffield MD   On: 12/16/2020 13:57    ____________________________________________   PROCEDURES  Procedure(s) performed (including Critical Care):  Procedures   ____________________________________________   INITIAL IMPRESSION / ASSESSMENT AND PLAN / ED COURSE      Patient presents with above-stated exam for assessment of some left hemibody weakness and paresthesias.  Seems the symptoms began yesterday more associate with some myalgias in her back.  On arrival she is afebrile and hemodynamically stable.  She has no objective focal neurological deficits.  However she does report decree sensation on light touch to the to this examiner and left arm and left leg.  Her abdomen is soft and her lungs are clear bilaterally.  Her back is unremarkable aside from some mild tenderness over the mid thoracic muscles  I suspect patient's back discomfort is likely unrelated to her left hemibody symptoms.  Unclear etiology although is MSK certainly within differential.  No evidence on x-ray of pneumonia large effusion pneumothorax or acute intrathoracic process.  ECG  shows no evidence of cardiac ischemia.  UA does not appear infected there is no blood to suggest kidney stone.  CMP shows no significant electrolyte metabolic derangements.  No evidence of cholestasis or hepatitis.  No right upper quadrant tenderness to suggest gallstones.  Lipase not consistent with acute pancreatitis.  Low suspicion for PE as patient is PERC negative.  Covid is negative.  CBC shows no leukocytosis or acute anemia.  Do believe patient is safe to follow-up with her PCP for further evaluation of his back pain.  With regard to her left hemibody symptoms are no objective findings on exam.  CT head is unremarkable as is MRI brain.  On review of records it seems patient has had these episodes multiple times in the past and been evaluated by neurology on outpatient basis several times.  Given recurrence of similar symptoms with otherwise reassuring exam and MRI of low suspicion for CVA or TIA at this time.  Unclear etiology for symptoms although ideally patient is safe for discharge with plan for outpatient neurology follow-up.  Patient discharged stable condition.  Strict precautions advised and discussed.    ____________________________________________   FINAL CLINICAL IMPRESSION(S) / ED DIAGNOSES  Final diagnoses:  Weakness  Acute bilateral thoracic back pain    Medications - No data to display   ED Discharge Orders    None       Note:  This document was prepared using Dragon voice recognition software and may include unintentional dictation errors.   Lucrezia Starch, MD 12/16/20 [phone]

## 2020-12-16 NOTE — ED Triage Notes (Signed)
Pt states she began having left sided weakness in her arm and leg around 1600 yesterday, left leg now WNL, states her left arm today feels "weak," began feeling left facial tingling this am. Speech is clear, face symmetrical. C/o pain in her upper back today. Denies headache.

## 2020-12-16 NOTE — ED Notes (Signed)
Patient to CT at this time

## 2020-12-16 NOTE — ED Notes (Signed)
Patient to MRI at this time.

## 2020-12-21 ENCOUNTER — Ambulatory Visit: Payer: Self-pay | Admitting: *Deleted

## 2020-12-21 ENCOUNTER — Ambulatory Visit
Admission: RE | Admit: 2020-12-21 | Discharge: 2020-12-21 | Disposition: A | Discharge status: Home / Self Care | Payer: 59 | Source: Ambulatory Visit | Attending: Family Medicine | Admitting: Family Medicine

## 2020-12-21 ENCOUNTER — Other Ambulatory Visit: Payer: Self-pay

## 2020-12-21 ENCOUNTER — Ambulatory Visit: Payer: 59 | Admitting: Family Medicine

## 2020-12-21 ENCOUNTER — Encounter: Payer: Self-pay | Admitting: Family Medicine

## 2020-12-21 VITALS — BP 112/72 | HR 96 | Temp 98.1°F | Resp 14 | Ht 63.0 in | Wt 144.0 lb

## 2020-12-21 DIAGNOSIS — F5104 Psychophysiologic insomnia: Secondary | ICD-10-CM

## 2020-12-21 DIAGNOSIS — Z730 Burn-out: Secondary | ICD-10-CM

## 2020-12-21 DIAGNOSIS — R202 Paresthesia of skin: Secondary | ICD-10-CM

## 2020-12-21 DIAGNOSIS — Z1231 Encounter for screening mammogram for malignant neoplasm of breast: Secondary | ICD-10-CM

## 2020-12-21 HISTORY — DX: Malignant (primary) neoplasm, unspecified: C80.1

## 2020-12-21 MED ORDER — HYDROXYZINE HCL 10 MG PO TABS: 10.0000 mg | ORAL_TABLET | Freq: Three times a day (TID) | ORAL | 0 refills | Status: DC | PRN

## 2020-12-21 NOTE — Telephone Encounter (Addendum)
Pt called stating she was seen in the ED on 12/16/20 due to left side numbness from her face to her legs;she says nothing was found at the ED visit and she was not given any meds in the ED; she would like to know if Dr Ancil Boozer can prescribe her something; the pt says she continues to have constant numbness since that time; recommendations made per nurse triage protocol; she verbalized understanding; decision tree completed;  pt offered and accepted appt with Dr Ancil Boozer, [place], 12/21/20 at 1500; the pt  verbalized understanding; will route to office for notification. Reason for Disposition . [1] Numbness or tingling on both sides of body AND [2] is a new symptom present > 24 hours  Answer Assessment - Initial Assessment Questions 1. SYMPTOM: "What is the main symptom you are concerned about?" (e.g., weakness, numbness)     Left side numbness  2. ONSET: "When did this start?" (minutes, hours, days; while sleeping)  12/16/20 3. LAST NORMAL: "When was the last time you were normal (no symptoms)?"     4. PATTERN "Does this come and go, or has it been constant since it started?"  "Is it present now?"  constant 5. CARDIAC SYMPTOMS: "Have you had any of the following symptoms: chest pain, difficulty breathing, palpitations?"      6. NEUROLOGIC SYMPTOMS: "Have you had any of the following symptoms: headache, dizziness, vision loss, double vision, changes in speech, unsteady on your feet?"  7. OTHER SYMPTOMS: "Do you have any other symptoms?"  8. PREGNANCY: "Is there any chance you are pregnant?" "When was your last menstrual period?"  Protocols used: NEUROLOGIC DEFICIT-A-AH

## 2020-12-21 NOTE — Progress Notes (Signed)
Name: Helen Green   MRN: [id number]    DOB: 1966-10-22   Date:12/21/2020       Progress Note  Subjective  Chief Complaint  Acute visit/ Generalized weakness/Hospital F/U  HPI  Left side weakness: she states this episode started suddenly on Thursday 12/15/2020. She states she has been feeling overwhelmed at work. They are short two people and there is a lot of work to be done this time of the year. She also states the night before she only slept for a few hours. The paresthesia was initially on left arm, the following day left leg and later on the left side of her face. She had episodes like this in the past and had previous MRI brain and NCS that were negative. She also had CT, MRI brain during [facility] visit and labs were also normal. She also has recurrent episodes of dyspepsia with multiple evaluations by GI, CT abdomen and [place]. She is not confrontational and in the past did not speak up for herself but states has been learning how to do that better. She does vent to her husband ( he is a Theme park manager). She denies being depressed, she appreciates her job and no change in stress level, but she does feel overwhelmed and has too many responsibilities. She feels like there is always something to do. She does not take time off work and no vacation in the past year. She wakes up feeling tired but thought it is secondary to working. Explained she is likely suffering from burn out. Discussed importance of seeing a counselor and she states her job offers free counseling now and she advises co-workers to take advantage of it but she never schedule an appointment to herself. She does not want medications She agrees in take a leave of absence until next week and will do some reading on burn out  .   Reviewed recent EC note and previous CT , MRI and also NCS and neurologist note   Patient Active Problem List   Diagnosis Date Noted  . Reactive airway disease 07/05/2020  . 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 Insomnia, persistent 05/03/2015  . Functional dyspepsia 05/03/2015  . Gastro-esophageal reflux disease without esophagitis 05/03/2015  . Perennial allergic rhinitis with seasonal variation 05/03/2015  . 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 Vitamin D deficiency 05/03/2015  . IBS (irritable bowel syndrome) 05/03/2015    Past Surgical History:  Procedure Laterality Date  . ABDOMINAL HYSTERECTOMY    . COLONOSCOPY WITH PROPOFOL N/A 09/27/2017   Procedure: COLONOSCOPY WITH PROPOFOL;  Surgeon: Jonathon Bellows, MD;  Location: [facility] ENDOSCOPY;  Service: Gastroenterology;  Laterality: N/A;  . ESOPHAGOGASTRODUODENOSCOPY (EGD) WITH PROPOFOL N/A 09/27/2017   Procedure: ESOPHAGOGASTRODUODENOSCOPY (EGD) WITH PROPOFOL;  Surgeon: Jonathon Bellows, MD;  Location: [facility] ENDOSCOPY;  Service: Gastroenterology;  Laterality: N/A;  . TUBAL LIGATION      Family History  Problem Relation Age of Onset  . Diabetes Mother   . Hyperlipidemia Mother   . Hypertension Mother   . Liver disease Father   . Cancer Brother 78       brain cancer  . Breast cancer Neg Hx     Social History   Tobacco Use  . Smoking status: Never Smoker  . Smokeless tobacco: Never Used  Substance Use Topics  . Alcohol use: No    Alcohol/week: 0.0 standard drinks     Current Outpatient Medications:  .  albuterol (VENTOLIN HFA) 108 (90 Base) MCG/ACT inhaler, Inhale 2 puffs  into the lungs every 6 (six) hours as needed for wheezing or shortness of breath., Disp: 18 g, Rfl: 0 .  chlorhexidine (PERIDEX) 0.12 % solution, SMARTSIG:By Mouth, Disp: , Rfl:  .  DULoxetine (CYMBALTA) 30 MG capsule, Take 1 capsule (30 mg total) by mouth daily., Disp: 90 capsule, Rfl: 1 .  fexofenadine (ALLEGRA ALLERGY) 180 MG tablet, Take 1 tablet (180 mg total) by mouth as needed., Disp: 30 tablet, Rfl: 5 .  fluconazole (DIFLUCAN) 150 MG tablet, Take 150 mg by mouth once., Disp: , Rfl:  .  fluticasone (FLONASE) 50 MCG/ACT nasal spray, instill 2 spray  into each nostril at bedtime as needed, Disp: 16 g, Rfl: 2 .  hyoscyamine (LEVSIN) 0.125 MG tablet, Take 1 tablet (0.125 mg total) by mouth every 4 (four) hours as needed., Disp: 100 tablet, Rfl: 1 .  dexlansoprazole (DEXILANT) 60 MG capsule, Take 1 capsule (60 mg total) by mouth daily. (Patient not taking: Reported on 12/21/2020), Disp: 30 capsule, Rfl: 1 .  hydrocortisone (ANUSOL-HC) 2.5 % rectal cream, Place 1 application rectally 2 (two) times daily. (Patient not taking: Reported on 12/21/2020), Disp: 30 g, Rfl: 0  Allergies  Allergen Reactions  . Apple Hives, Shortness Of Breath and Swelling  . Avocado Hives, Shortness Of Breath and Swelling    and raw fruit and vegetables  . Daucus Carota Hives, Shortness Of Breath and Swelling  . Other Hives, Shortness Of Breath and Swelling    Pears, avacados, nuts    I personally reviewed active problem list, medication list, allergies, family history, social history with the patient/caregiver today.   ROS  Ten systems reviewed and is negative except as mentioned in HPI   Objective  Vitals:   12/21/20 1511  BP: 112/72  Pulse: 96  Resp: 14  Temp: 98.1 F (36.7 C)  TempSrc: Oral  SpO2: 99%  Weight: 144 lb (65.3 kg)  Height: 5\' 3"  (1.6 m)    Body mass index is 25.51 kg/m.  Physical Exam  Constitutional: Patient appears well-developed and well-nourished.  No distress.  HEENT: head atraumatic, normocephalic, pupils equal and reactive to light,  neck supple Cardiovascular: Normal rate, regular rhythm and normal heart sounds.  No murmur heard. No BLE edema. Pulmonary/Chest: Effort normal and breath sounds normal. No respiratory distress. Abdominal: Soft.  There is no tenderness. Neurological exam intact , romberg negative, cranial nerves intake, normal sensation  Psychiatric: Patient has a normal mood and affect. behavior is normal. Judgment and thought content normal.  Recent Results (from the past 2160 hour(s))  POCT Urinalysis  Dipstick     Status: Normal   Collection Time: 11/04/20 11:06 AM  Result Value Ref Range   Color, UA clear    Clarity, UA clear    Glucose, UA Negative Negative   Bilirubin, UA neg    Ketones, UA neg    Spec Grav, UA 1.010 1.010 - 1.025   Blood, UA neg    pH, UA 6.0 5.0 - 8.0   Protein, UA Negative Negative   Urobilinogen, UA 0.2 0.2 or 1.0 E.U./dL   Nitrite, UA neg    Leukocytes, UA Negative Negative   Appearance normal    Odor normal   Protime-INR     Status: None   Collection Time: 12/16/20 11:20 AM  Result Value Ref Range   Prothrombin Time 12.2 11.4 - 15.2 seconds   INR 1.0 0.8 - 1.2    Comment: (NOTE) INR goal varies based on device and disease states. Performed  at [facility], [address] [place], [place] [address]   APTT     Status: None   Collection Time: 12/16/20 11:20 AM  Result Value Ref Range   aPTT 34 24 - 36 seconds    Comment: Performed at [facility], [address] [place], [place] [address]  CBC     Status: None   Collection Time: 12/16/20 11:20 AM  Result Value Ref Range   WBC 7.2 4.0 - 10.5 K/uL   RBC 4.71 3.87 - 5.11 MIL/uL   Hemoglobin 13.6 12.0 - 15.0 g/dL   HCT 40.1 36.0 - 46.0 %   MCV 85.1 80.0 - 100.0 fL   MCH 28.9 26.0 - 34.0 pg   MCHC 33.9 30.0 - 36.0 g/dL   RDW 13.3 11.5 - 15.5 %   Platelets 293 150 - 400 K/uL   nRBC 0.0 0.0 - 0.2 %    Comment: Performed at [facility], [address] [place], [place] [address]  Differential     Status: None   Collection Time: 12/16/20 11:20 AM  Result Value Ref Range   Neutrophils Relative % 56 %   Neutro Abs 4.0 1.7 - 7.7 K/uL   Lymphocytes Relative 32 %   Lymphs Abs 2.3 0.7 - 4.0 K/uL   Monocytes Relative 7 %   Monocytes Absolute 0.5 0.1 - 1.0 K/uL   Eosinophils Relative 4 %   Eosinophils Absolute 0.3 0.0 - 0.5 K/uL   Basophils Relative 1 %   Basophils Absolute 0.1 0.0 - 0.1 K/uL   Immature Granulocytes 0 %   Abs Immature Granulocytes 0.03 0.00 - 0.07  K/uL    Comment: Performed at [facility], [address] [place], [place] [address]  Comprehensive metabolic panel     Status: None   Collection Time: 12/16/20 11:20 AM  Result Value Ref Range   Sodium 139 135 - 145 mmol/L   Potassium 4.2 3.5 - 5.1 mmol/L   Chloride 105 98 - 111 mmol/L   CO2 28 22 - 32 mmol/L   Glucose, Bld 98 70 - 99 mg/dL    Comment: Glucose reference range applies only to samples taken after fasting for at least 8 hours.   BUN 17 6 - 20 mg/dL   Creatinine, Ser 0.74 0.44 - 1.00 mg/dL   Calcium 9.7 8.9 - 10.3 mg/dL   Total Protein 7.6 6.5 - 8.1 g/dL   Albumin 4.5 3.5 - 5.0 g/dL   AST 17 15 - 41 U/L   ALT 14 0 - 44 U/L   Alkaline Phosphatase 61 38 - 126 U/L   Total Bilirubin 1.0 0.3 - 1.2 mg/dL   GFR, Estimated >60 >60 mL/min    Comment: (NOTE) Calculated using the CKD-EPI Creatinine Equation (2021)    Anion gap 6 5 - 15    Comment: Performed at [facility], [address] [place], [place] [address]  CBG monitoring, ED     Status: None   Collection Time: 12/16/20 11:20 AM  Result Value Ref Range   Glucose-Capillary 71 70 - 99 mg/dL    Comment: Glucose reference range applies only to samples taken after fasting for at least 8 hours.  Urinalysis, Complete w Microscopic     Status: Abnormal   Collection Time: 12/16/20 11:20 AM  Result Value Ref Range   Color, Urine YELLOW (A) YELLOW   APPearance CLEAR (A) CLEAR   Specific Gravity, Urine 1.018 1.005 - 1.030   pH  5.0 5.0 - 8.0   Glucose, UA NEGATIVE NEGATIVE mg/dL   Hgb urine dipstick NEGATIVE NEGATIVE   Bilirubin Urine NEGATIVE NEGATIVE   Ketones, ur NEGATIVE NEGATIVE mg/dL   Protein, ur NEGATIVE NEGATIVE mg/dL   Nitrite NEGATIVE NEGATIVE   Leukocytes,Ua NEGATIVE NEGATIVE   RBC / HPF 0-5 0 - 5 RBC/hpf   WBC, UA 0-5 0 - 5 WBC/hpf   Bacteria, UA RARE (A) NONE SEEN   Squamous Epithelial / LPF 0-5 0 - 5   Mucus PRESENT     Comment: Performed at [facility], [address] [place], [place] [address]  Lipase, blood     Status: None   Collection Time: 12/16/20 11:20 AM  Result Value Ref Range   Lipase 40 11 - 51 U/L    Comment: Performed at [facility], [address] [place], [place] [address]  Resp Panel by RT-PCR (Flu A&B, Covid) Nasopharyngeal Swab     Status: None   Collection Time: 12/16/20 11:42 AM   Specimen: Nasopharyngeal Swab; Nasopharyngeal(NP) swabs in vial transport medium  Result Value Ref Range   SARS Coronavirus 2 by RT PCR NEGATIVE NEGATIVE    Comment: (NOTE) SARS-CoV-2 target nucleic acids are NOT DETECTED.  The SARS-CoV-2 RNA is generally detectable in upper respiratory specimens during the acute phase of infection. The lowest concentration of SARS-CoV-2 viral copies this assay can detect is 138 copies/mL. A negative result does not preclude SARS-Cov-2 infection and should not be used as the sole basis for treatment or other patient management decisions. A negative result may occur with  improper specimen collection/handling, submission of specimen other than nasopharyngeal swab, presence of viral mutation(s) within the areas targeted by this assay, and inadequate number of viral copies(<138 copies/mL). A negative result must be combined with clinical observations, patient history, and epidemiological information. The expected result is Negative.  Fact Sheet for Patients:  EntrepreneurPulse.com.au  Fact Sheet for Healthcare Providers:  IncredibleEmployment.be  This test is no t yet approved or cleared by the [place] FDA and  has been authorized for detection and/or diagnosis of SARS-CoV-2 by FDA under an Emergency Use Authorization (EUA). This EUA will remain  in effect (meaning this test can be used) for the duration of the COVID-19 declaration under Section 564(b)(1) of the Act, 21 U.S.C.section 360bbb-3(b)(1), unless the authorization is terminated  or revoked sooner.        Influenza A by PCR NEGATIVE NEGATIVE   Influenza B by PCR NEGATIVE NEGATIVE    Comment: (NOTE) The Xpert Xpress SARS-CoV-2/FLU/RSV plus assay is intended as an aid in the diagnosis of influenza from Nasopharyngeal swab specimens and should not be used as a sole basis for treatment. Nasal washings and aspirates are unacceptable for Xpert Xpress SARS-CoV-2/FLU/RSV testing.  Fact Sheet for Patients: EntrepreneurPulse.com.au  Fact Sheet for Healthcare Providers: IncredibleEmployment.be  This test is not yet approved or cleared by the [place] FDA and has been authorized for detection and/or diagnosis of SARS-CoV-2 by FDA under an Emergency Use Authorization (EUA). This EUA will remain in effect (meaning this test can be used) for the duration of the COVID-19 declaration under Section 564(b)(1) of the Act, 21 U.S.C. section 360bbb-3(b)(1), unless the authorization is terminated or revoked.  Performed at [facility], [address] [place], [place] [address]       PHQ2/9: Depression screen [facility] 2/9 12/21/2020 07/05/2020 03/24/2020 03/02/2020 02/04/2020  Decreased Interest 0 0 0 0 0  Down, Depressed, Hopeless 0  0 0 0 0  PHQ - 2 Score 0 0 0 0 0  Altered sleeping - - 0 0 0  Tired, decreased energy - - 0 0 0  Change in appetite - - 0 0 0  Feeling bad or failure about yourself  - - 0 0 0  Trouble concentrating - - 0 0 0  Moving slowly or fidgety/restless - - 0 0 0  Suicidal thoughts - - 0 0 0  PHQ-9 Score - - 0 0 0  Difficult doing work/chores - - - Not difficult at all -  Some recent data might be hidden    phq 9 is negative   Fall Risk: Fall Risk  12/21/2020 07/05/2020 03/24/2020 03/02/2020 02/04/2020  Falls in the past year? 0 0 0 0 0  Number falls in past yr: 0 0 - 0 0  Injury with Fall? 0 0 - 0 0  Follow up - - - Falls evaluation completed -      Assessment & Plan  1. Paresthesia  Reassurance  2.  Burn-out  Time off, discuss it with counselor   3. Psychophysiological insomnia  - hydrOXYzine (ATARAX/VISTARIL) 10 MG tablet; Take 1-2 tablets (10-20 mg total) by mouth 3 (three) times daily as needed.  Dispense: 30 tablet; Refill: 0

## 2021-01-30 ENCOUNTER — Other Ambulatory Visit: Payer: Self-pay

## 2021-01-30 ENCOUNTER — Encounter: Payer: Self-pay | Admitting: Gastroenterology

## 2021-01-30 ENCOUNTER — Ambulatory Visit: Payer: 59 | Admitting: Gastroenterology

## 2021-01-30 VITALS — BP 118/67 | HR 84 | Temp 98.2°F | Ht 63.0 in | Wt 144.4 lb

## 2021-01-30 DIAGNOSIS — K219 Gastro-esophageal reflux disease without esophagitis: Secondary | ICD-10-CM | POA: Diagnosis not present

## 2021-01-30 NOTE — Progress Notes (Signed)
Cephas Darby, MD [address]  [place]  [place], [place] [address]  Main: [phone]  Fax: [phone] Pager: [phone]   Primary Care Physician: Steele Sizer, MD  Primary Gastroenterologist:  Dr. Cephas Darby  Chief Complaint  Patient presents with  . Bloated  . Heartburn    HPI: Helen Green is a 55 y.o. female with history of constipation, functional dyspepsia, reflux, abdominal bloating.  She underwent extensive work-up thus far including bidirectional endoscopy, cross-sectional imaging which were unremarkable.  H. pylori breath test negative, TTG IgA negative.  Patient contacted my office last week of August secondary to severe heartburn, abdominal discomfort, severe bloating as well as rectal bleeding.  I have suggested her to try Dexilant 60 mg daily and also Anusol cream.  She reports that rectal bleeding subsided.  She continues to have severe abdominal bloating and burps frequently whenever she massages her body.  She also reports severe burning in her upper stomach as well as throat.  She is taking Bentyl as needed which provides temporary relief only.  She thinks Dexilant or Prilosec are not helping much.  She is also generally stressed out with her job, it involves frequent travel and also is involved in church activities.  Patient underwent ligation of internal hemorrhoids.  She developed severe pain after second ligation.  She chose not to undergo third ligation.  Follow-up visit 01/30/21 Patient is here for follow-up of her functional GI symptoms.  She has been treated for abdominal bloating with a 2 weeks course of metronidazole as rifaximin has been denied by her insurance.  She reports that she had flareup of severe heartburn as well as regurgitation.  Dexilant led to severe constipation.  Therefore, she is taking over-the-counter Nexium once a day at bedtime which is providing modest relief.  She does acknowledge drinking sweet tea with each  meal as well as craving for sweets.  Current Outpatient Medications:  .  albuterol (VENTOLIN HFA) 108 (90 Base) MCG/ACT inhaler, Inhale 2 puffs into the lungs every 6 (six) hours as needed for wheezing or shortness of breath., Disp: 18 g, Rfl: 0 .  chlorhexidine (PERIDEX) 0.12 % solution, SMARTSIG:By Mouth, Disp: , Rfl:  .  DULoxetine (CYMBALTA) 30 MG capsule, Take 1 capsule (30 mg total) by mouth daily., Disp: 90 capsule, Rfl: 1 .  fexofenadine (ALLEGRA ALLERGY) 180 MG tablet, Take 1 tablet (180 mg total) by mouth as needed., Disp: 30 tablet, Rfl: 5 .  fluconazole (DIFLUCAN) 150 MG tablet, Take 150 mg by mouth once., Disp: , Rfl:  .  fluticasone (FLONASE) 50 MCG/ACT nasal spray, instill 2 spray into each nostril at bedtime as needed, Disp: 16 g, Rfl: 2 .  hydrocortisone (ANUSOL-HC) 2.5 % rectal cream, Place 1 application rectally 2 (two) times daily., Disp: 30 g, Rfl: 0 .  hydrOXYzine (ATARAX/VISTARIL) 10 MG tablet, Take 1-2 tablets (10-20 mg total) by mouth 3 (three) times daily as needed., Disp: 30 tablet, Rfl: 0 .  hyoscyamine (LEVSIN) 0.125 MG tablet, Take 1 tablet (0.125 mg total) by mouth every 4 (four) hours as needed., Disp: 100 tablet, Rfl: 1   Allergies as of 01/30/2021 - Review Complete 01/30/2021  Allergen Reaction Noted  . Apple Hives, Shortness Of Breath, and Swelling 05/03/2015  . Avocado Hives, Shortness Of Breath, and Swelling 05/03/2015  . Daucus carota Hives, Shortness Of Breath, and Swelling 07/30/2014  . Other Hives, Shortness Of Breath, and Swelling 05/03/2015    NSAIDs: none  Antiplts/Anticoagulants/Anti thrombotics:  none  GI procedures:  EGD and colonoscopy by Dr. Vicente Males 09/27/2017 - Normal examined duodenum. - Normal esophagus. - Normal stomach. Biopsied.  - One 3 mm polyp in the transverse colon, removed with a cold biopsy forceps. Resected and retrieved. - The examination was otherwise normal on direct and retroflexion views.  DIAGNOSIS:  A. STOMACH;  COLD BIOPSY:  - ANTRAL MUCOSA WITH MILD CHRONIC GASTRITIS.  - NEGATIVE FOR H. PYLORI, DYSPLASIA AND MALIGNANCY.   B. COLON POLYP, TRANSVERSE; COLD BIOPSY:  - TUBULAR ADENOMA.  - NEGATIVE FOR HIGH-GRADE DYSPLASIA AND MALIGNANCY.   ROS:  General: Negative for anorexia, weight loss, fever, chills, fatigue, weakness. ENT: Negative for hoarseness, difficulty swallowing , nasal congestion. CV: Negative for chest pain, angina, palpitations, dyspnea on exertion, peripheral edema.  Respiratory: Negative for dyspnea at rest, dyspnea on exertion, cough, sputum, wheezing.  GI: See history of present illness. GU:  Negative for dysuria, hematuria, urinary incontinence, urinary frequency, nocturnal urination.  Endo: Negative for unusual weight change.    Physical Examination:   BP 118/67 (BP Location: Left Arm, Patient Position: Sitting, Cuff Size: Normal)   Pulse 84   Temp 98.2 F (36.8 C) (Oral)   Ht 5\' 3"  (1.6 m)   Wt 144 lb 6 oz (65.5 kg)   BMI 25.57 kg/m   General: Well-nourished, well-developed in no acute distress.  Eyes: No icterus. Conjunctivae pink. Mouth: Oropharyngeal mucosa moist and pink , no lesions erythema or exudate. Lungs: Clear to auscultation bilaterally. Non-labored. Heart: Regular rate and rhythm, no murmurs rubs or gallops.  Abdomen: Bowel sounds are normal, nontender, moderately distended, tympanic to percussion, no hepatosplenomegaly or masses, no hernia , no rebound or guarding.   Rectum: Mild tenderness in the posterior anal canal, moderate tenderness in the anterior anal canal consistent with anal fissure Extremities: No lower extremity edema. No clubbing or deformities. Neuro: Alert and oriented x 3.  Grossly intact. Skin: Warm and dry, no jaundice.   Psych: Alert and cooperative, normal mood and affect.   Imaging Studies: reviewed  Assessment and Plan:   Helen Green is a 55 y.o. Hispanic female is seen for flareup of severe abdominal bloating,  abdominal discomfort as well as frequent burping, heartburn   Abdominal bloating, currently resolved S/p 2 weeks course of metronidazole as rifaximin has been not been approved by her insurance  Nonulcer dyspepsia EGD unremarkable, no evidence of H. Pylori TTG IgA unremarkable  Heartburn and regurgitation Discussed about antireflux lifestyle and food triggers Strongly advised to cut back on sweet tea and sweets Continue Nexium 20 mg, can increase to twice daily before meals I have discussed about pH impedance study if her symptoms are persistent for the next 3 months and despite dietary modification  Chronic anal fissure Resolved  Tubular adenoma colon: Surveillance colonoscopy in 09/2022  Follow up in 3 months or sooner if needed  Dr [name], MD

## 2021-02-11 ENCOUNTER — Other Ambulatory Visit: Payer: Self-pay | Admitting: Family Medicine

## 2021-02-11 DIAGNOSIS — J069 Acute upper respiratory infection, unspecified: Secondary | ICD-10-CM

## 2021-02-11 NOTE — Telephone Encounter (Signed)
Requested Prescriptions  Pending Prescriptions Disp Refills  . albuterol (VENTOLIN HFA) 108 (90 Base) MCG/ACT inhaler [Pharmacy Med Name: ALBUTEROL HFA INH (200 PUFFS)8.5GM] 18 g 0    Sig: INHALE 2 PUFFS INTO THE LUNGS EVERY 6 HOURS AS NEEDED FOR WHEEZING OR SHORTNESS OF BREATH     Pulmonology:  Beta Agonists Failed - 02/11/2021  4:53 PM      Failed - One inhaler should last at least one month. If the patient is requesting refills earlier, contact the patient to check for uncontrolled symptoms.      Passed - Valid encounter within last 12 months    Recent Outpatient Visits          1 month ago [facility], Drue Stager, MD   7 months ago Mild intermittent reactive airway disease without complication   [facility] Steele Sizer, MD   10 months ago Well adult exam   [facility] Steele Sizer, MD   11 months ago Recurrent low back pain   [facility] Steele Sizer, MD   1 year ago Basal cell carcinoma of scalp   [facility] Steele Sizer, MD      Future Appointments            In 1 month Ancil Boozer, Drue Stager, MD [facility] [place]   In 2 months Vanga, Tally Due, MD [place]

## 2021-02-17 ENCOUNTER — Other Ambulatory Visit: Payer: Self-pay

## 2021-02-17 ENCOUNTER — Ambulatory Visit: Payer: 59 | Admitting: Family Medicine

## 2021-02-17 ENCOUNTER — Encounter: Payer: Self-pay | Admitting: Family Medicine

## 2021-02-17 VITALS — BP 110/70 | HR 90 | Temp 98.2°F | Resp 16 | Ht 63.0 in | Wt 144.4 lb

## 2021-02-17 DIAGNOSIS — J302 Other seasonal allergic rhinitis: Secondary | ICD-10-CM

## 2021-02-17 DIAGNOSIS — J4520 Mild intermittent asthma, uncomplicated: Secondary | ICD-10-CM

## 2021-02-17 DIAGNOSIS — J3089 Other allergic rhinitis: Secondary | ICD-10-CM

## 2021-02-17 MED ORDER — CETIRIZINE HCL 10 MG PO TABS: 10.0000 mg | ORAL_TABLET | Freq: Every day | ORAL | 11 refills | Status: DC

## 2021-02-17 MED ORDER — TRELEGY ELLIPTA 100-62.5-25 MCG/INH IN AEPB: 1.0000 | INHALATION_SPRAY | Freq: Every day | RESPIRATORY_TRACT | 11 refills | Status: DC

## 2021-02-17 MED ORDER — FLUTICASONE PROPIONATE 50 MCG/ACT NA SUSP
NASAL | 2 refills | Status: DC
Start: 2021-02-17 — End: 2022-01-05

## 2021-02-17 MED ORDER — PREDNISONE 20 MG PO TABS: ORAL_TABLET | ORAL | 0 refills | Status: AC

## 2021-02-17 MED ORDER — ALBUTEROL SULFATE HFA 108 (90 BASE) MCG/ACT IN AERS: 2.0000 | INHALATION_SPRAY | Freq: Four times a day (QID) | RESPIRATORY_TRACT | 8 refills | Status: DC | PRN

## 2021-02-17 NOTE — Patient Instructions (Signed)
http://www.aaaai.org/conditions-and-treatments/asthma">  Asthma, Adult  Asthma is a long-term (chronic) condition that causes recurrent episodes in which the airways become tight and narrow. The airways are the passages that lead from the nose and mouth down into the lungs. Asthma episodes, also called asthma attacks, can cause coughing, wheezing, shortness of breath, and chest pain. The airways can also fill with mucus. During an attack, it can be difficult to breathe. Asthma attacks can range from minor to life threatening. Asthma cannot be cured, but medicines and lifestyle changes can help control it and treat acute attacks. What are the causes? This condition is believed to be caused by inherited (genetic) and environmental factors, but its exact cause is not known. There are many things that can bring on an asthma attack or make asthma symptoms worse (triggers). Asthma triggers are different for each person. Common triggers include:  Mold.  Dust.  Cigarette smoke.  Cockroaches.  Things that can cause allergy symptoms (allergens), such as animal dander or pollen from trees or grass.  Air pollutants such as household cleaners, wood smoke, smog, or chemical odors.  Cold air, weather changes, and winds (which increase molds and pollen in the air).  Strong emotional expressions such as crying or laughing hard.  Stress.  Certain medicines (such as aspirin) or types of medicines (such as beta-blockers).  Sulfites in foods and drinks. Foods and drinks that may contain sulfites include dried fruit, potato chips, and sparkling grape juice.  Infections or inflammatory conditions such as the flu, a cold, or inflammation of the nasal membranes (rhinitis).  Gastroesophageal reflux disease (GERD).  Exercise or strenuous activity. What are the signs or symptoms? Symptoms of this condition may occur right after asthma is triggered or many hours later. Symptoms include:  Wheezing. This can  sound like whistling when you breathe.  Excessive nighttime or early morning coughing.  Frequent or severe coughing with a common cold.  Chest tightness.  Shortness of breath.  Tiredness (fatigue) with minimal activity. How is this diagnosed? This condition is diagnosed based on:  Your medical history.  A physical exam.  Tests, which may include: ? Lung function studies and pulmonary studies (spirometry). These tests can evaluate the flow of air in your lungs. ? Allergy tests. ? Imaging tests, such as X-rays. How is this treated? There is no cure for this condition, but treatment can help control your symptoms. Treatment for asthma usually involves:  Identifying and avoiding your asthma triggers.  Using medicines to control your symptoms. Generally, two types of medicines are used to treat asthma: ? Controller medicines. These help prevent asthma symptoms from occurring. They are usually taken every day. ? Fast-acting reliever or rescue medicines. These quickly relieve asthma symptoms by widening the narrow and tight airways. They are used as needed and provide short-term relief.  Using supplemental oxygen. This may be needed during a severe episode.  Using other medicines, such as: ? Allergy medicines, such as antihistamines, if your asthma attacks are triggered by allergens. ? Immune medicines (immunomodulators). These are medicines that help control the immune system.  Creating an asthma action plan. An asthma action plan is a written plan for managing and treating your asthma attacks. This plan includes: ? A list of your asthma triggers and how to avoid them. ? Information about when medicines should be taken and when their dosage should be changed. ? Instructions about using a device called a peak flow meter. A peak flow meter measures how well the lungs are working   and the severity of your asthma. It helps you monitor your condition. Follow these instructions at  home: Controlling your home environment Control your home environment in the following ways to help avoid triggers and prevent asthma attacks:  Change your heating and air conditioning filter regularly.  Limit your use of fireplaces and wood stoves.  Get rid of pests (such as roaches and mice) and their droppings.  Throw away plants if you see mold on them.  Clean floors and dust surfaces regularly. Use unscented cleaning products.  Try to have someone else vacuum for you regularly. Stay out of rooms while they are being vacuumed and for a short while afterward. If you vacuum, use a dust mask from a hardware store, a double-layered or microfilter vacuum cleaner bag, or a vacuum cleaner with a HEPA filter.  Replace carpet with wood, tile, or vinyl flooring. Carpet can trap dander and dust.  Use allergy-proof pillows, mattress covers, and box spring covers.  Keep your bedroom a trigger-free room.  Avoid pets and keep windows closed when allergens are in the air.  Wash beddings every week in hot water and dry them in a dryer.  Use blankets that are made of polyester or cotton.  Clean bathrooms and kitchens with bleach. If possible, have someone repaint the walls in these rooms with mold-resistant paint. Stay out of the rooms that are being cleaned and painted.  Wash your hands often with soap and water. If soap and water are not available, use hand sanitizer.  Do not allow anyone to smoke in your home. General instructions  Take over-the-counter and prescription medicines only as told by your health care provider. ? Speak with your health care provider if you have questions about how or when to take the medicines. ? Make note if you are requiring more frequent dosages.  Do not use any products that contain nicotine or tobacco, such as cigarettes and e-cigarettes. If you need help quitting, ask your health care provider. Also, avoid being exposed to secondhand smoke.  Use a peak  flow meter as told by your health care provider. Record and keep track of the readings.  Understand and use the asthma action plan to help minimize, or stop an asthma attack, without needing to seek medical care.  Make sure you stay up to date on your yearly vaccinations as told by your health care provider. This may include vaccines for the flu and pneumonia.  Avoid outdoor activities when allergen counts are high and when air quality is low.  Wear a ski mask that covers your nose and mouth during outdoor winter activities. Exercise indoors on cold days if you can.  Warm up before exercising, and take time for a cool-down period after exercise.  Keep all follow-up visits as told by your health care provider. This is important. Where to find more information  For information about asthma, turn to the Centers for Disease Control and Prevention at http://www.clark.net/  For air quality information, turn to [employer] at https://www.miller-reyes.info/ Contact a health care provider if:  You have wheezing, shortness of breath, or a cough even while you are taking medicine to prevent attacks.  The mucus you cough up (sputum) is thicker than usual.  Your sputum changes from clear or white to yellow, green, gray, or bloody.  Your medicines are causing side effects, such as a rash, itching, swelling, or trouble breathing.  You need to use a reliever medicine more than 2-3 times a week.  Your peak  flow reading is still at 50-79% of your personal best after following your action plan for 1 hour.  You have a fever. Get help right away if:  You are getting worse and do not respond to treatment during an asthma attack.  You are short of breath when at rest or when doing very little physical activity.  You have difficulty eating, drinking, or talking.  You have chest pain or tightness.  You develop a fast heartbeat or palpitations.  You have a bluish color to your lips or fingernails.  You are  light-headed or dizzy, or you faint.  Your peak flow reading is less than 50% of your personal best.  You feel too tired to breathe normally. Summary  Asthma is a long-term (chronic) condition that causes recurrent episodes in which the airways become tight and narrow. These episodes can cause coughing, wheezing, shortness of breath, and chest pain.  Asthma cannot be cured, but medicines and lifestyle changes can help control it and treat acute attacks.  Make sure you understand how to avoid triggers and how and when to use your medicines.  Asthma attacks can range from minor to life threatening. Get help right away if you have an asthma attack and do not respond to treatment with your usual rescue medicines. This information is not intended to replace advice given to you by your health care provider. Make sure you discuss any questions you have with your health care provider. Document Revised: 07/08/2020 Document Reviewed: 02/10/2020 Elsevier Patient Education  2021 [employer].

## 2021-02-17 NOTE — Progress Notes (Signed)
4/29/20229:10 AM  Helen Green [birth year], 55 y.o., female [id number]  Chief Complaint  Patient presents with  . Allergic Rhinitis   . Cough    HPI:   Patient is a 55 y.o. female with past medical history significant for allergies, GERD who presents today for cough and allergies.  Gets allergies yearly This started end of Feb Has been taking Allegra and then switched to cetrizine Has been using medication from her country Helps with itchy watery eyes Her cough continues to get worse Uses Flonase nasal spray  Uses the albuterol every 6 hours Tends to ave issues with reactive airway  Was on Trelegy in the past Had stopped taking this when her cough stopped in the past  Depression screen [facility] 2/9 02/17/2021 12/21/2020 07/05/2020  Decreased Interest 0 0 0  Down, Depressed, Hopeless 0 0 0  PHQ - 2 Score 0 0 0  Altered sleeping 0 0 -  Tired, decreased energy 0 2 -  Change in appetite 0 0 -  Feeling bad or failure about yourself  0 0 -  Trouble concentrating 0 0 -  Moving slowly or fidgety/restless 0 0 -  Suicidal thoughts 0 0 -  PHQ-9 Score 0 2 -  Difficult doing work/chores - - -  Some recent data might be hidden    Fall Risk  02/17/2021 12/21/2020 07/05/2020 03/24/2020 03/02/2020  Falls in the past year? 1 0 0 0 0  Number falls in past yr: 1 0 0 - 0  Injury with Fall? 0 0 0 - 0  Risk for fall due to : History of fall(s) - - - -  Follow up Falls evaluation completed - - - Falls evaluation completed     Allergies  Allergen Reactions  . Apple Hives, Shortness Of Breath and Swelling  . Avocado Hives, Shortness Of Breath and Swelling    and raw fruit and vegetables  . Daucus Carota Hives, Shortness Of Breath and Swelling  . Other Hives, Shortness Of Breath and Swelling    Pears, avacados, nuts    Prior to Admission medications   Medication Sig Start Date End Date Taking? Authorizing Provider  albuterol (VENTOLIN HFA) 108 (90 Base) MCG/ACT inhaler INHALE 2 PUFFS  INTO THE LUNGS EVERY 6 HOURS AS NEEDED FOR WHEEZING OR SHORTNESS OF BREATH 02/11/21  Yes Sowles, Drue Stager, MD  chlorhexidine (PERIDEX) 0.12 % solution SMARTSIG:By Mouth 08/15/20  Yes [provider]  DULoxetine (CYMBALTA) 30 MG capsule Take 1 capsule (30 mg total) by mouth daily. 07/05/20  Yes Sowles, Drue Stager, MD  fexofenadine [facility] ALLERGY) 180 MG tablet Take 1 tablet (180 mg total) by mouth as needed. 07/12/15  Yes Sowles, Drue Stager, MD  fluticasone Asencion Islam) 50 MCG/ACT nasal spray instill 2 spray into each nostril at bedtime as needed 02/11/18  Yes Sowles, Drue Stager, MD  hydrocortisone (ANUSOL-HC) 2.5 % rectal cream Place 1 application rectally 2 (two) times daily. 06/21/20  Yes Vanga, Tally Due, MD  hydrOXYzine (ATARAX/VISTARIL) 10 MG tablet Take 1-2 tablets (10-20 mg total) by mouth 3 (three) times daily as needed. 12/21/20  Yes Sowles, Drue Stager, MD  hyoscyamine (LEVSIN) 0.125 MG tablet Take 1 tablet (0.125 mg total) by mouth every 4 (four) hours as needed. 03/24/20  Yes Steele Sizer, MD    Past Medical History:  Diagnosis Date  . Allergy   . Cancer ([place])    skin ca  . Chronic abdominal pain   . Chronic insomnia   . Food allergy   . Functional  dyspepsia   . GERD (gastroesophageal reflux disease)   . IBS (irritable bowel syndrome)   . Insomnia   . Irritable bowel syndrome with diarrhea   . Left arm weakness   . Neck pain   . Subacute maxillary sinusitis   . Vitamin A deficiency     Past Surgical History:  Procedure Laterality Date  . ABDOMINAL HYSTERECTOMY    . COLONOSCOPY WITH PROPOFOL N/A 09/27/2017   Procedure: COLONOSCOPY WITH PROPOFOL;  Surgeon: Jonathon Bellows, MD;  Location: [facility] ENDOSCOPY;  Service: Gastroenterology;  Laterality: N/A;  . ESOPHAGOGASTRODUODENOSCOPY (EGD) WITH PROPOFOL N/A 09/27/2017   Procedure: ESOPHAGOGASTRODUODENOSCOPY (EGD) WITH PROPOFOL;  Surgeon: Jonathon Bellows, MD;  Location: [facility] ENDOSCOPY;  Service: Gastroenterology;  Laterality: N/A;  . TUBAL  LIGATION      Social History   Tobacco Use  . Smoking status: Never Smoker  . Smokeless tobacco: Never Used  Substance Use Topics  . Alcohol use: No    Alcohol/week: 0.0 standard drinks    Family History  Problem Relation Age of Onset  . Diabetes Mother   . Hyperlipidemia Mother   . Hypertension Mother   . Liver disease Father   . Cancer Brother 40       brain cancer  . Breast cancer Neg Hx     Review of Systems  Constitutional: Negative.   HENT: Positive for congestion and sore throat. Negative for sinus pain.   Eyes: Positive for redness.  Respiratory: Positive for cough. Negative for shortness of breath and wheezing.   Cardiovascular: Negative.   Neurological: Negative.      OBJECTIVE:  Today's Vitals   02/17/21 0849  BP: 110/70  Pulse: 90  Resp: 16  Temp: 98.2 F (36.8 C)  TempSrc: Oral  SpO2: 97%  Weight: 144 lb 6.4 oz (65.5 kg)  Height: 5\' 3"  (1.6 m)   Body mass index is 25.58 kg/m.   Physical Exam Constitutional:      General: She is not in acute distress.    Appearance: Normal appearance. She is not ill-appearing.  HENT:     Head: Normocephalic.     Mouth/Throat:     Mouth: Mucous membranes are moist.     Pharynx: Oropharynx is clear. No oropharyngeal exudate or posterior oropharyngeal erythema.  Cardiovascular:     Rate and Rhythm: Normal rate and regular rhythm.     Pulses: Normal pulses.     Heart sounds: Normal heart sounds. No murmur heard. No friction rub. No gallop.   Pulmonary:     Effort: Pulmonary effort is normal. No respiratory distress.     Breath sounds: Normal breath sounds. No stridor. No wheezing, rhonchi or rales.  Abdominal:     General: Bowel sounds are normal.     Palpations: Abdomen is soft.     Tenderness: There is no abdominal tenderness.  Musculoskeletal:     Right lower leg: No edema.     Left lower leg: No edema.  Skin:    General: Skin is warm and dry.  Neurological:     Mental Status: She is alert and  oriented to person, place, and time.  Psychiatric:        Mood and Affect: Mood normal.        Behavior: Behavior normal.     No results found for this or any previous visit (from the past 24 hour(s)).  No results found.   ASSESSMENT and PLAN  Problem List Items Addressed This Visit  Respiratory   Perennial allergic rhinitis with seasonal variation   Relevant Medications   predniSONE (DELTASONE) 20 MG tablet   cetirizine (ZYRTEC) 10 MG tablet   fluticasone (FLONASE) 50 MCG/ACT nasal spray   Reactive airway disease - Primary   Relevant Medications   predniSONE (DELTASONE) 20 MG tablet   Fluticasone-Umeclidin-Vilant (TRELEGY ELLIPTA) 100-62.5-25 MCG/INH AEPB   albuterol (VENTOLIN HFA) 108 (90 Base) MCG/ACT inhaler      Plan . Discussed daily allergy regimen . Restart Trelegy daily, continue albuterol PRN . Prednisone taper ordered, r/se/b discused   Return in about 6 weeks (around 03/31/2021).    Huston Foley Lynley Killilea, FNP-BC [facility]

## 2021-03-30 ENCOUNTER — Encounter: Payer: 59 | Admitting: Family Medicine

## 2021-04-05 ENCOUNTER — Encounter: Payer: 59 | Admitting: Family Medicine

## 2021-05-03 ENCOUNTER — Encounter: Payer: Self-pay | Admitting: Gastroenterology

## 2021-05-03 ENCOUNTER — Telehealth (INDEPENDENT_AMBULATORY_CARE_PROVIDER_SITE_OTHER): Payer: 59 | Admitting: Gastroenterology

## 2021-05-03 DIAGNOSIS — K219 Gastro-esophageal reflux disease without esophagitis: Secondary | ICD-10-CM | POA: Diagnosis not present

## 2021-05-03 DIAGNOSIS — R1013 Epigastric pain: Secondary | ICD-10-CM | POA: Diagnosis not present

## 2021-05-03 NOTE — Progress Notes (Signed)
Helen Sear, MD [address]  [place]  [place], [place] [address]  Main: [phone]  Fax: [phone]    Gastroenterology Consultation Video Visit  Referring Provider:     Steele Sizer, MD Primary Care Physician:  Steele Sizer, MD Primary Gastroenterologist:  Dr. Cephas Darby Reason for Consultation:     Abdominal bloating, heartburn        HPI:   Helen Green is a 55 y.o. female referred by Dr. Steele Sizer, MD  for consultation & management of functional dyspepsia, chronic GERD, abdominal bloating, constipation  Virtual Visit Video Note  I connected with Helen Green on 05/04/21 at  3:15 PM EDT by video and verified that I am speaking with the correct person using two identifiers.   I discussed the limitations, risks, security and privacy concerns of performing an evaluation and management service by video and the availability of in person appointments. I also discussed with the patient that there may be a patient responsible charge related to this service. The patient expressed understanding and agreed to proceed.  Location of the Patient: Home  Location of the provider: Home office  Persons participating in the visit: Patient and provider only   History of Present Illness:   Helen Green is a 55 y.o. female with history of constipation, functional dyspepsia, reflux, abdominal bloating.  She underwent extensive work-up thus far including bidirectional endoscopy, cross-sectional imaging which were unremarkable.  H. pylori breath test negative, TTG IgA negative.  Patient contacted my office last week of August secondary to severe heartburn, abdominal discomfort, severe bloating as well as rectal bleeding.  I have suggested her to try Dexilant 60 mg daily and also Anusol cream.  She reports that rectal bleeding subsided.  She continues to have severe abdominal bloating and burps frequently whenever she massages her body.  She also  reports severe burning in her upper stomach as well as throat.  She is taking Bentyl as needed which provides temporary relief only.  She thinks Dexilant or Prilosec are not helping much.  She is also generally stressed out with her job, it involves frequent travel and also is involved in church activities.   Patient underwent ligation of internal hemorrhoids.  She developed severe pain after second ligation.  She chose not to undergo third ligation.   Follow-up visit 01/30/21 Patient is here for follow-up of her functional GI symptoms.  She has been treated for abdominal bloating with a 2 weeks course of metronidazole as rifaximin has been denied by her insurance.  She reports that she had flareup of severe heartburn as well as regurgitation.  Dexilant led to severe constipation.  Therefore, she is taking over-the-counter Nexium once a day at bedtime which is providing modest relief.  She does acknowledge drinking sweet tea with each meal as well as craving for sweets.  Follow-up visit 05/04/2021 Patient is currently recovering from [place].  She did have a flareup of reflux during this episode.  Patient is currently taking duloxetine for her functional GI symptoms, predominantly the pain which is actually responding to duloxetine.  She reports that her bowel movements are regular other than first 2 days of onset of COVID-19 symptoms when she was constipated.  NSAIDs: none   Antiplts/Anticoagulants/Anti thrombotics: none   GI procedures: EGD and colonoscopy by Dr. Vicente Males 09/27/2017 - Normal examined duodenum. - Normal esophagus. - Normal stomach. Biopsied.   - One 3 mm polyp in the transverse colon, removed with a  cold biopsy forceps. Resected and retrieved. - The examination was otherwise normal on direct and retroflexion views.   DIAGNOSIS:  A.  STOMACH; COLD BIOPSY:  - ANTRAL MUCOSA WITH MILD CHRONIC GASTRITIS.  - NEGATIVE FOR H. PYLORI, DYSPLASIA AND MALIGNANCY.   B.  COLON POLYP,  TRANSVERSE; COLD BIOPSY:  - TUBULAR ADENOMA.  - NEGATIVE FOR HIGH-GRADE DYSPLASIA AND MALIGNANCY.  Past Medical History:  Diagnosis Date   Allergy    Cancer ([place])    skin ca   Chronic abdominal pain    Chronic insomnia    Food allergy    Functional dyspepsia    GERD (gastroesophageal reflux disease)    IBS (irritable bowel syndrome)    Insomnia    Irritable bowel syndrome with diarrhea    Left arm weakness    Neck pain    Subacute maxillary sinusitis    Vitamin A deficiency     Past Surgical History:  Procedure Laterality Date   ABDOMINAL HYSTERECTOMY     COLONOSCOPY WITH PROPOFOL N/A 09/27/2017   Procedure: COLONOSCOPY WITH PROPOFOL;  Surgeon: Jonathon Bellows, MD;  Location: [facility] ENDOSCOPY;  Service: Gastroenterology;  Laterality: N/A;   ESOPHAGOGASTRODUODENOSCOPY (EGD) WITH PROPOFOL N/A 09/27/2017   Procedure: ESOPHAGOGASTRODUODENOSCOPY (EGD) WITH PROPOFOL;  Surgeon: Jonathon Bellows, MD;  Location: [facility] ENDOSCOPY;  Service: Gastroenterology;  Laterality: N/A;   TUBAL LIGATION      Current Outpatient Medications:    albuterol (VENTOLIN HFA) 108 (90 Base) MCG/ACT inhaler, Inhale 2 puffs into the lungs every 6 (six) hours as needed for wheezing or shortness of breath., Disp: 18 g, Rfl: 8   cetirizine (ZYRTEC) 10 MG tablet, Take 1 tablet (10 mg total) by mouth daily., Disp: 30 tablet, Rfl: 11   chlorhexidine (PERIDEX) 0.12 % solution, SMARTSIG:By Mouth, Disp: , Rfl:    DULoxetine (CYMBALTA) 30 MG capsule, Take 1 capsule (30 mg total) by mouth daily., Disp: 90 capsule, Rfl: 1   fexofenadine (ALLEGRA ALLERGY) 180 MG tablet, Take 1 tablet (180 mg total) by mouth as needed., Disp: 30 tablet, Rfl: 5   fluticasone (FLONASE) 50 MCG/ACT nasal spray, instill 2 spray into each nostril at bedtime as needed, Disp: 16 g, Rfl: 2   Fluticasone-Umeclidin-Vilant (TRELEGY ELLIPTA) 100-62.5-25 MCG/INH AEPB, Inhale 1 puff into the lungs daily., Disp: 1 each, Rfl: 11   hydrocortisone (ANUSOL-HC) 2.5 % rectal  cream, Place 1 application rectally 2 (two) times daily., Disp: 30 g, Rfl: 0   hydrOXYzine (ATARAX/VISTARIL) 10 MG tablet, Take 1-2 tablets (10-20 mg total) by mouth 3 (three) times daily as needed., Disp: 30 tablet, Rfl: 0   hyoscyamine (LEVSIN) 0.125 MG tablet, Take 1 tablet (0.125 mg total) by mouth every 4 (four) hours as needed., Disp: 100 tablet, Rfl: 1   Family History  Problem Relation Age of Onset   Diabetes Mother    Hyperlipidemia Mother    Hypertension Mother    Liver disease Father    Cancer Brother 60       brain cancer   Breast cancer Neg Hx      Social History   Tobacco Use   Smoking status: Never   Smokeless tobacco: Never  Vaping Use   Vaping Use: Never used  Substance Use Topics   Alcohol use: No    Alcohol/week: 0.0 standard drinks   Drug use: No    Allergies as of 05/03/2021 - Review Complete 05/03/2021  Allergen Reaction Noted   Apple Hives, Shortness Of Breath, and Swelling 05/03/2015   Avocado Hives,  Shortness Of Breath, and Swelling 05/03/2015   Daucus carota Hives, Shortness Of Breath, and Swelling 07/30/2014   Other Hives, Shortness Of Breath, and Swelling 05/03/2015    Imaging Studies: Reviewed  Assessment and Plan:   Helen Green is a 55 y.o. female with chronic GERD, functional dyspepsia, abdominal bloating.  Her symptoms are currently fairly regulated.  I encouraged her to continue duloxetine.  I have provided reassurance again today that her symptoms are functional.   Follow Up Instructions:   I discussed the assessment and treatment plan with the patient. The patient was provided an opportunity to ask questions and all were answered. The patient agreed with the plan and demonstrated an understanding of the instructions.   The patient was advised to call back or seek an in-person evaluation if the symptoms worsen or if the condition fails to improve as anticipated.  I provided 15 minutes of face-to-face time during this  encounter.   Follow up as needed   Cephas Darby, MD

## 2021-05-04 ENCOUNTER — Encounter: Payer: Self-pay | Admitting: Gastroenterology

## 2021-05-11 ENCOUNTER — Telehealth: Payer: Self-pay

## 2021-05-11 NOTE — Telephone Encounter (Signed)
Copied from [place] [id number]. Topic: Appointment Scheduling - Scheduling Inquiry for Clinic >> May 11, 2021  1:31 PM Helen Green wrote: Reason for CRM: pt was covid pos 07/12.  She has occasional cough, ongoing tiredness, and appetite is no good.  Pt wants appt with Dr Ancil Boozer to get checked out and let her listen to her chest.

## 2021-05-11 NOTE — Telephone Encounter (Signed)
Lvm to schedule appt but was told that she may have to do a virtual visit.

## 2021-07-04 ENCOUNTER — Telehealth: Payer: Self-pay | Admitting: Family Medicine

## 2021-07-04 ENCOUNTER — Other Ambulatory Visit: Payer: Self-pay

## 2021-07-04 DIAGNOSIS — K589 Irritable bowel syndrome without diarrhea: Secondary | ICD-10-CM

## 2021-07-04 NOTE — Telephone Encounter (Signed)
Medication Refill - Medication:  hyoscyamine (LEVSIN) 0.125 MG tablet 100 tablet 1 03/24/2020    Sig - Route: Take 1 tablet (0.125 mg total) by mouth every 4 (four) hours as needed. - Oral   Sent to pharmacy as: hyoscyamine (LEVSIN) 0.125 MG tablet     Has the patient contacted their pharmacy? Yes.   (Agent: If no, request that the patient contact the pharmacy for the refill.)call dr (Agent: If yes, when and what did the pharmacy advise?)  Preferred Pharmacy (with phone number or street name):  [facility] [id number], [address]  [place] [address]  Phone: [phone] Fax: [phone]  Hours: Not open 24 hours    Agent: Please be advised that RX refills may take up to 3 business days. We ask that you follow-up with your pharmacy.

## 2021-07-04 NOTE — Telephone Encounter (Deleted)
{  [facility] TELEPHONE NOTE:[phone]}

## 2021-07-04 NOTE — Telephone Encounter (Signed)
Pts next appt is 08/08/21

## 2021-07-05 MED ORDER — HYOSCYAMINE SULFATE 0.125 MG PO TABS: 0.1250 mg | ORAL_TABLET | ORAL | 1 refills | Status: DC | PRN

## 2021-08-07 NOTE — Progress Notes (Signed)
Name: Helen Green   MRN: [id number]    DOB: [birth year]   Date:08/08/2021       Progress Note  Subjective  Chief Complaint  Annual Exam  HPI  Patient presents for annual CPE.  Diet: packing her lunch, cooks at home Exercise: discussed increase in physical activity    [place] Visit from 08/08/2021 in [facility]  AUDIT-C Score 0      Depression: Phq 9 is  negative Depression screen [facility] 2/9 08/08/2021 02/17/2021 12/21/2020 07/05/2020 03/24/2020  Decreased Interest 0 0 0 0 0  Down, Depressed, Hopeless 0 0 0 0 0  PHQ - 2 Score 0 0 0 0 0  Altered sleeping 0 0 0 - 0  Tired, decreased energy 0 0 2 - 0  Change in appetite 0 0 0 - 0  Feeling bad or failure about yourself  0 0 0 - 0  Trouble concentrating 0 0 0 - 0  Moving slowly or fidgety/restless 0 0 0 - 0  Suicidal thoughts 0 0 0 - 0  PHQ-9 Score 0 0 2 - 0  Difficult doing work/chores - - - - -  Some recent data might be hidden   Hypertension: BP Readings from Last 3 Encounters:  08/08/21 108/64  02/17/21 110/70  01/30/21 118/67   Obesity: Wt Readings from Last 3 Encounters:  08/08/21 147 lb (66.7 kg)  02/17/21 144 lb 6.4 oz (65.5 kg)  01/30/21 144 lb 6 oz (65.5 kg)   BMI Readings from Last 3 Encounters:  08/08/21 26.04 kg/m  02/17/21 25.58 kg/m  01/30/21 25.57 kg/m     Vaccines:   Shingrix: she wants to hold off for now  Pneumonia: she wants to hold off for now  PPJ:KDTOI   Hep C Screening: 03/24/20 STD testing and prevention (HIV/chl/gon/syphilis): Not interested  Intimate partner violence: negative Sexual History : one sexual partner , no pain or post-coital bleeding  Menstrual History/LMP/Abnormal Bleeding: s/p supra cervical hysterectomy  Incontinence Symptoms: no problems.   Breast cancer:  - Last Mammogram: 12/21/20 - BRCA gene screening: N/A  Osteoporosis: Discussed high calcium and vitamin D supplementation, weight bearing exercises  Cervical cancer  screening: 03/24/20  Skin cancer: Discussed monitoring for atypical lesions  Colorectal cancer: 09/27/17   Lung cancer: Low Dose CT Chest recommended if Age 20-80 years, 20 pack-year currently smoking OR have quit w/in 15years. Patient does not qualify.   ECG: 12/19/20  Advanced Care Planning: A voluntary discussion about advance care planning including the explanation and discussion of advance directives.  Discussed health care proxy and Living will, and the patient was able to identify a health care proxy as husband.  Patient does not have a living will at present time. If patient does have living will, I have requested they bring this to the clinic to be scanned in to their chart.  Lipids: Lab Results  Component Value Date   CHOL 220 (H) 03/24/2020   CHOL 193 09/26/2012   Lab Results  Component Value Date   HDL 72 03/24/2020   HDL 81 (A) 09/26/2012   Lab Results  Component Value Date   LDLCALC 126 (H) 03/24/2020   LDLCALC 96 09/26/2012   Lab Results  Component Value Date   TRIG 109 03/24/2020   TRIG 79 09/26/2012   Lab Results  Component Value Date   CHOLHDL 3.1 03/24/2020   No results found for: LDLDIRECT  Glucose: Glucose, Bld  Date Value Ref Range Status  12/16/2020  98 70 - 99 mg/dL Final    Comment:    Glucose reference range applies only to samples taken after fasting for at least 8 hours.  03/24/2020 87 65 - 99 mg/dL Final    Comment:    .            Fasting reference interval .   05/20/2019 103 (H) 65 - 99 mg/dL Final    Comment:    .            Fasting reference interval . For someone without known diabetes, a glucose value between 100 and 125 mg/dL is consistent with prediabetes and should be confirmed with a follow-up test. .    Glucose-Capillary  Date Value Ref Range Status  12/16/2020 71 70 - 99 mg/dL Final    Comment:    Glucose reference range applies only to samples taken after fasting for at least 8 hours.    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   Vitamin D deficiency 05/03/2015   IBS (irritable bowel syndrome) 05/03/2015    Past Surgical History:  Procedure Laterality Date   ABDOMINAL HYSTERECTOMY     COLONOSCOPY WITH PROPOFOL N/A 09/27/2017   Procedure: COLONOSCOPY WITH PROPOFOL;  Surgeon: Jonathon Bellows, MD;  Location: [facility] ENDOSCOPY;  Service: Gastroenterology;  Laterality: N/A;   ESOPHAGOGASTRODUODENOSCOPY (EGD) WITH PROPOFOL N/A 09/27/2017   Procedure: ESOPHAGOGASTRODUODENOSCOPY (EGD) WITH PROPOFOL;  Surgeon: Jonathon Bellows, MD;  Location: [facility] ENDOSCOPY;  Service: Gastroenterology;  Laterality: N/A;   TUBAL LIGATION      Family History  Problem Relation Age of Onset   Diabetes Mother    Hyperlipidemia Mother    Hypertension Mother    Liver disease Father    Cancer Brother 77       brain cancer   Breast cancer Neg Hx     Social History   Socioeconomic History   Marital status: Married    Spouse name: [name]   Number of children: 2   Years of education: Not on file   Highest education level: Associate degree: occupational, Hotel manager, or vocational program  Occupational History   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       Right handed    Two story home   Social Determinants of Radio broadcast assistant Strain: Low Risk    Difficulty of Paying Living Expenses: Not hard at all  Food Insecurity: No Food Insecurity   Worried About Charity fundraiser in the Last Year: Never true   Arboriculturist in the Last Year: Never true  Transportation Needs: No Data processing manager (Medical): No  Lack of Transportation (Non-Medical): No  Physical Activity: Insufficiently Active   Days of Exercise per Week: 2 days   Minutes of Exercise per Session: 10 min  Stress: No Stress Concern Present   Feeling of Stress : Not at all  Social Connections: Moderately Integrated   Frequency of Communication with Friends and Family: More than three times a week   Frequency of Social Gatherings with Friends and Family: Once a week   Attends Religious Services: More than 4 times per year   Active Member of [employer] or Organizations: No   Attends Music therapist: Never   Marital Status: Married  Human resources officer Violence: Not At Risk   Fear of Current or Ex-Partner: No   Emotionally Abused: No   Physically Abused: No   Sexually Abused: No     Current Outpatient Medications:    albuterol (VENTOLIN HFA) 108 (90 Base) MCG/ACT inhaler, Inhale 2 puffs into the lungs every 6 (six) hours as needed for wheezing or shortness of breath., Disp: 18 g, Rfl: 8   DULoxetine (CYMBALTA) 30 MG capsule, Take 1 capsule (30 mg total) by mouth daily., Disp: 90 capsule, Rfl: 1   fexofenadine (ALLEGRA ALLERGY) 180 MG tablet, Take 1 tablet (180 mg total) by mouth as needed., Disp: 30 tablet, Rfl: 5   fluticasone (FLONASE) 50 MCG/ACT nasal spray, instill 2 spray into each nostril at bedtime as needed, Disp: 16 g, Rfl: 2   hydrocortisone (ANUSOL-HC) 2.5 % rectal cream, Place 1 application rectally 2 (two) times daily., Disp: 30 g, Rfl: 0   hyoscyamine (LEVSIN) 0.125 MG tablet, Take 1 tablet (0.125 mg total) by mouth  every 4 (four) hours as needed., Disp: 100 tablet, Rfl: 1  Allergies  Allergen Reactions   Apple Hives, Shortness Of Breath and Swelling   Avocado Hives, Shortness Of Breath and Swelling    and raw fruit and vegetables   Daucus Carota Hives, Shortness Of Breath and Swelling   Other Hives, Shortness Of Breath and Swelling    Pears, avacados, nuts     ROS  Constitutional: Negative for fever or weight change.  Respiratory: Negative for cough and shortness of breath.   Cardiovascular: Negative for chest pain or palpitations.  Gastrointestinal: Negative for abdominal pain, no bowel changes.  Musculoskeletal: Negative for gait problem or joint swelling.  Skin: Negative for rash.  Neurological: Negative for dizziness or headache.  No other specific complaints in a complete review of systems (except as listed in HPI above).   Objective  Vitals:   08/08/21 0843  BP: 108/64  Pulse: 72  Resp: 16  Temp: 97.6 F (36.4 C)  SpO2: 99%  Weight: 147 lb (66.7 kg)  Height: $Remove'5\' 3"'dJozRJJ$  (1.6 m)    Body mass index is 26.04 kg/m.  Physical Exam  Constitutional: Patient appears well-developed and well-nourished. No distress.  HENT: Head: Normocephalic and atraumatic. Ears: B TMs ok, no erythema or effusion; Nose: Not done . Mouth/Throat:not done  Eyes: Conjunctivae and EOM are normal. Pupils are equal, round, and reactive to light. No scleral icterus.  Neck: Normal range of motion. Neck supple. No JVD present. No thyromegaly present.  Cardiovascular: Normal rate, regular rhythm and normal heart sounds.  No murmur heard. No BLE edema. Pulmonary/Chest: Effort normal and breath sounds normal. No respiratory distress. Abdominal: Soft. Bowel sounds are normal, no distension. There is no tenderness. no masses Breast: no lumps or masses, no nipple discharge or rashes FEMALE GENITALIA:  Not done  RECTAL: not  done  Musculoskeletal: Normal range of motion, no joint effusions. No gross  deformities Neurological: he is alert and oriented to person, place, and time. No cranial nerve deficit. Coordination, balance, strength, speech and gait are normal.  Skin: Skin is warm and dry. No rash noted. No erythema.  Psychiatric: Patient has a normal mood and affect. behavior is normal. Judgment and thought content normal.   Fall Risk: Fall Risk  08/08/2021 02/17/2021 12/21/2020 07/05/2020 03/24/2020  Falls in the past year? 0 1 0 0 0  Number falls in past yr: 0 1 0 0 -  Injury with Fall? 0 0 0 0 -  Risk for fall due to : No Fall Risks History of fall(s) - - -  Follow up Falls prevention discussed Falls evaluation completed - - -     Functional Status Survey: Is the patient deaf or have difficulty hearing?: No Does the patient have difficulty seeing, even when wearing glasses/contacts?: No Does the patient have difficulty concentrating, remembering, or making decisions?: No Does the patient have difficulty walking or climbing stairs?: No Does the patient have difficulty dressing or bathing?: No Does the patient have difficulty doing errands alone such as visiting a doctor's office or shopping?: No   Assessment & Plan  1. Well adult exam  - Lipid panel - CBC with Differential/Platelet - COMPLETE METABOLIC PANEL WITH GFR - Hemoglobin A1c - VITAMIN D 25 Hydroxy (Vit-D Deficiency, Fractures)  2. Needs flu shot  - Flu Vaccine QUAD 6+ mos PF IM (Fluarix Quad PF)   3. Need for shingles vaccine  refused  4. Breast cancer screening by mammogram  - MM 3D SCREEN BREAST BILATERAL; Future  5. Vitamin D deficiency  - VITAMIN D 25 Hydroxy (Vit-D Deficiency, Fractures)  6. Long-term use of high-risk medication  - COMPLETE METABOLIC PANEL WITH GFR  7. Lipid screening  - Lipid panel  8. History of anemia  - CBC with Differential/Platelet  9. Diabetes mellitus screening  - Hemoglobin A1c  10. Need for vaccination for pneumococcus  - Flu Vaccine QUAD 6+ mos PF IM  (Fluarix Quad PF)    -USPSTF grade A and B recommendations reviewed with patient; age-appropriate recommendations, preventive care, screening tests, etc discussed and encouraged; healthy living encouraged; see AVS for patient education given to patient -Discussed importance of 150 minutes of physical activity weekly, eat two servings of fish weekly, eat one serving of tree nuts ( cashews, pistachios, pecans, almonds.Marland Kitchen) every other day, eat 6 servings of fruit/vegetables daily and drink plenty of water and avoid sweet beverages.

## 2021-08-07 NOTE — Patient Instructions (Signed)
Preventive Care 53-53 Years Old, Female Preventive care refers to lifestyle choices and visits with your health care provider that can promote health and wellness. This includes: A yearly physical exam. This is also called an annual wellness visit. Regular dental and eye exams. Immunizations. Screening for certain conditions. Healthy lifestyle choices, such as: Eating a healthy diet. Getting regular exercise. Not using drugs or products that contain nicotine and tobacco. Limiting alcohol use. What can I expect for my preventive care visit? Physical exam Your health care provider will check your: Height and weight. These may be used to calculate your BMI (body mass index). BMI is a measurement that tells if you are at a healthy weight. Heart rate and blood pressure. Body temperature. Skin for abnormal spots. Counseling Your health care provider may ask you questions about your: Past medical problems. Family's medical history. Alcohol, tobacco, and drug use. Emotional well-being. Home life and relationship well-being. Sexual activity. Diet, exercise, and sleep habits. Work and work environment. Access to firearms. Method of birth control. Menstrual cycle. Pregnancy history. What immunizations do I need? Vaccines are usually given at various ages, according to a schedule. Your health care provider will recommend vaccines for you based on your age, medical history, and lifestyle or other factors, such as travel or where you work. What tests do I need? Blood tests Lipid and cholesterol levels. These may be checked every 5 years, or more often if you are over 53 years old. Hepatitis C test. Hepatitis B test. Screening Lung cancer screening. You may have this screening every year starting at age 53 if you have a 30-pack-year history of smoking and currently smoke or have quit within the past 15 years. Colorectal cancer screening. All adults should have this screening starting at  age 53 and continuing until age 75. Your health care provider may recommend screening at age 53 if you are at increased risk. You will have tests every 1-10 years, depending on your results and the type of screening test. Diabetes screening. This is done by checking your blood sugar (glucose) after you have not eaten for a while (fasting). You may have this done every 1-3 years. Mammogram. This may be done every 1-2 years. Talk with your health care provider about when you should start having regular mammograms. This may depend on whether you have a family history of breast cancer. BRCA-related cancer screening. This may be done if you have a family history of breast, ovarian, tubal, or peritoneal cancers. Pelvic exam and Pap test. This may be done every 3 years starting at age 21. Starting at age 53, this may be done every 5 years if you have a Pap test in combination with an HPV test. Other tests STD (sexually transmitted disease) testing, if you are at risk. Bone density scan. This is done to screen for osteoporosis. You may have this scan if you are at high risk for osteoporosis. Talk with your health care provider about your test results, treatment options, and if necessary, the need for more tests. Follow these instructions at home: Eating and drinking  Eat a diet that includes fresh fruits and vegetables, whole grains, lean protein, and low-fat dairy products. Take vitamin and mineral supplements as recommended by your health care provider. Do not drink alcohol if: Your health care provider tells you not to drink. You are pregnant, may be pregnant, or are planning to become pregnant. If you drink alcohol: Limit how much you have to 0-1 drink a day. Be   aware of how much alcohol is in your drink. In the [place] one drink equals one 12 oz bottle of beer (355 mL), one 5 oz glass of wine (148 mL), or one 1 oz glass of hard liquor (44 mL). Lifestyle Take daily care of your teeth and  gums. Brush your teeth every morning and night with fluoride toothpaste. Floss one time each day. Stay active. Exercise for at least 30 minutes 5 or more days each week. Do not use any products that contain nicotine or tobacco, such as cigarettes, e-cigarettes, and chewing tobacco. If you need help quitting, ask your health care provider. Do not use drugs. If you are sexually active, practice safe sex. Use a condom or other form of protection to prevent STIs (sexually transmitted infections). If you do not wish to become pregnant, use a form of birth control. If you plan to become pregnant, see your health care provider for a prepregnancy visit. If told by your health care provider, take low-dose aspirin daily starting at age 53. Find healthy ways to cope with stress, such as: Meditation, yoga, or listening to music. Journaling. Talking to a trusted person. Spending time with friends and family. Safety Always wear your seat belt while driving or riding in a vehicle. Do not drive: If you have been drinking alcohol. Do not ride with someone who has been drinking. When you are tired or distracted. While texting. Wear a helmet and other protective equipment during sports activities. If you have firearms in your house, make sure you follow all gun safety procedures. What's next? Visit your health care provider once a year for an annual wellness visit. Ask your health care provider how often you should have your eyes and teeth checked. Stay up to date on all vaccines. This information is not intended to replace advice given to you by your health care provider. Make sure you discuss any questions you have with your health care provider. Document Revised: 12/16/2020 Document Reviewed: 06/19/2018 Elsevier Patient Education  2022 [employer].

## 2021-08-08 ENCOUNTER — Encounter: Payer: Self-pay | Admitting: Family Medicine

## 2021-08-08 ENCOUNTER — Other Ambulatory Visit: Payer: Self-pay

## 2021-08-08 ENCOUNTER — Ambulatory Visit (INDEPENDENT_AMBULATORY_CARE_PROVIDER_SITE_OTHER): Payer: 59 | Admitting: Family Medicine

## 2021-08-08 VITALS — BP 108/64 | HR 72 | Temp 97.6°F | Resp 16 | Ht 63.0 in | Wt 147.0 lb

## 2021-08-08 DIAGNOSIS — Z13220 Encounter for screening for lipoid disorders: Secondary | ICD-10-CM

## 2021-08-08 DIAGNOSIS — Z0000 Encounter for general adult medical examination without abnormal findings: Secondary | ICD-10-CM

## 2021-08-08 DIAGNOSIS — Z1231 Encounter for screening mammogram for malignant neoplasm of breast: Secondary | ICD-10-CM

## 2021-08-08 DIAGNOSIS — E559 Vitamin D deficiency, unspecified: Secondary | ICD-10-CM | POA: Diagnosis not present

## 2021-08-08 DIAGNOSIS — Z79899 Other long term (current) drug therapy: Secondary | ICD-10-CM

## 2021-08-08 DIAGNOSIS — Z131 Encounter for screening for diabetes mellitus: Secondary | ICD-10-CM

## 2021-08-08 DIAGNOSIS — Z23 Encounter for immunization: Secondary | ICD-10-CM | POA: Diagnosis not present

## 2021-08-08 DIAGNOSIS — Z862 Personal history of diseases of the blood and blood-forming organs and certain disorders involving the immune mechanism: Secondary | ICD-10-CM

## 2021-08-09 LAB — CBC WITH DIFFERENTIAL/PLATELET
Absolute Monocytes: 560 cells/uL (ref 200–950)
Basophils Absolute: 42 cells/uL (ref 0–200)
Basophils Relative: 0.6 %
Eosinophils Absolute: 322 cells/uL (ref 15–500)
Eosinophils Relative: 4.6 %
HCT: 42.3 % (ref 35.0–45.0)
Hemoglobin: 14.3 g/dL (ref 11.7–15.5)
Lymphs Abs: 2345 cells/uL (ref 850–3900)
MCH: 29.2 pg (ref 27.0–33.0)
MCHC: 33.8 g/dL (ref 32.0–36.0)
MCV: 86.5 fL (ref 80.0–100.0)
MPV: 9.8 fL (ref 7.5–12.5)
Monocytes Relative: 8 %
Neutro Abs: 3731 cells/uL (ref 1500–7800)
Neutrophils Relative %: 53.3 %
Platelets: 284 10*3/uL (ref 140–400)
RBC: 4.89 10*6/uL (ref 3.80–5.10)
RDW: 13.7 % (ref 11.0–15.0)
Total Lymphocyte: 33.5 %
WBC: 7 10*3/uL (ref 3.8–10.8)

## 2021-08-09 LAB — COMPLETE METABOLIC PANEL WITH GFR
AG Ratio: 1.8 (calc) (ref 1.0–2.5)
ALT: 15 U/L (ref 6–29)
AST: 15 U/L (ref 10–35)
Albumin: 4.5 g/dL (ref 3.6–5.1)
Alkaline phosphatase (APISO): 61 U/L (ref 37–153)
BUN: 12 mg/dL (ref 7–25)
CO2: 29 mmol/L (ref 20–32)
Calcium: 9.7 mg/dL (ref 8.6–10.4)
Chloride: 104 mmol/L (ref 98–110)
Creat: 0.67 mg/dL (ref 0.50–1.03)
Globulin: 2.5 g/dL (calc) (ref 1.9–3.7)
Glucose, Bld: 90 mg/dL (ref 65–99)
Potassium: 4.3 mmol/L (ref 3.5–5.3)
Sodium: 141 mmol/L (ref 135–146)
Total Bilirubin: 0.8 mg/dL (ref 0.2–1.2)
Total Protein: 7 g/dL (ref 6.1–8.1)
eGFR: 104 mL/min/{1.73_m2} (ref >=60)

## 2021-08-09 LAB — LIPID PANEL
Cholesterol: 224 mg/dL — ABNORMAL HIGH (ref <200)
HDL: 73 mg/dL (ref >=50)
LDL Cholesterol (Calc): 129 mg/dL (calc) — ABNORMAL HIGH
Non-HDL Cholesterol (Calc): 151 mg/dL (calc) — ABNORMAL HIGH (ref <130)
Total CHOL/HDL Ratio: 3.1 (calc) (ref <5.0)
Triglycerides: 110 mg/dL (ref <150)

## 2021-08-09 LAB — HEMOGLOBIN A1C
Hgb A1c MFr Bld: 5.5 % of total Hgb (ref <5.7)
Mean Plasma Glucose: 111 mg/dL
eAG (mmol/L): 6.2 mmol/L

## 2021-08-09 LAB — VITAMIN D 25 HYDROXY (VIT D DEFICIENCY, FRACTURES): Vit D, 25-Hydroxy: 29 ng/mL — ABNORMAL LOW (ref 30–100)

## 2021-08-22 ENCOUNTER — Encounter: Payer: Self-pay | Admitting: Family Medicine

## 2021-08-22 ENCOUNTER — Telehealth (INDEPENDENT_AMBULATORY_CARE_PROVIDER_SITE_OTHER): Payer: 59 | Admitting: Family Medicine

## 2021-08-22 ENCOUNTER — Telehealth: Payer: Self-pay

## 2021-08-22 VITALS — Ht 63.0 in | Wt 147.0 lb

## 2021-08-22 DIAGNOSIS — J029 Acute pharyngitis, unspecified: Secondary | ICD-10-CM

## 2021-08-22 DIAGNOSIS — J4520 Mild intermittent asthma, uncomplicated: Secondary | ICD-10-CM

## 2021-08-22 LAB — POCT RAPID STREP A (OFFICE): Rapid Strep A Screen: POSITIVE — AB

## 2021-08-22 MED ORDER — ALBUTEROL SULFATE HFA 108 (90 BASE) MCG/ACT IN AERS: 2.0000 | INHALATION_SPRAY | Freq: Four times a day (QID) | RESPIRATORY_TRACT | 8 refills | Status: DC | PRN

## 2021-08-22 NOTE — Patient Instructions (Signed)
It was great to see you!  Our plans for today:  - Try warm liquids and honey for your throat. - We are testing for COVID and strep throat and will notify you of your results.  - If your symptoms worsen, come back to see [place]. Certainly if you develop difficulties breathing, chest pain, vomiting and can't keep down fluids, go to the hospital.  We are checking some labs today, we will release these results to your MyChart.  Take care and seek immediate care sooner if you develop any concerns.   Dr. Ky Barban

## 2021-08-22 NOTE — Addendum Note (Signed)
Addended by: Docia Furl on: 08/22/2021 12:35 PM   Modules accepted: Orders

## 2021-08-22 NOTE — Progress Notes (Signed)
Virtual Visit via Video Note  I connected with [name] on 08/22/21 at 10:40 AM EDT by a video enabled telemedicine application and verified that I am speaking with the correct person using two identifiers.  Location: Patient: work Provider: [facility]   I discussed the limitations of evaluation and management by telemedicine and the availability of in person appointments. The patient expressed understanding and agreed to proceed.  History of Present Illness:  SORE THROAT - symptom onset 10/30 - h/o asthma. Has not had to use albuterol. - hoarse voice, sore throat - no spots in mouth  Fever: no Cough: yes, nonproductive Shortness of breath: no Wheezing: no Chest tightness: no Chest congestion: no Nasal congestion: no Runny nose: no Post nasal drip: no Sore throat: yes, with difficulty swallowing Swollen glands: no Vomiting: no Rash: no Sick contacts: yes Strep contacts: yes    Observations/Objective:  Well appearing, in NAD. Speaks in full sentences, no respiratory distress.  Assessment and Plan:  Sore throat Will test for COVID and strep and treat as indicated. Provided information on OTC symptom relief, self-quarantine guidelines, and emergency precautions.     I discussed the assessment and treatment plan with the patient. The patient was provided an opportunity to ask questions and all were answered. The patient agreed with the plan and demonstrated an understanding of the instructions.   The patient was advised to call back or seek an in-person evaluation if the symptoms worsen or if the condition fails to improve as anticipated.  I provided 7 minutes of non-face-to-face time during this encounter.   Myles Gip, DO

## 2021-08-23 LAB — SARS-COV-2, NAA 2 DAY TAT

## 2021-08-23 LAB — SPECIMEN STATUS REPORT

## 2021-08-23 LAB — NOVEL CORONAVIRUS, NAA: SARS-CoV-2, NAA: NOT DETECTED

## 2021-08-23 MED ORDER — AMOXICILLIN 500 MG PO TABS: 500.0000 mg | ORAL_TABLET | Freq: Two times a day (BID) | ORAL | 0 refills | Status: AC

## 2021-08-23 NOTE — Telephone Encounter (Signed)
Dr. Ky Barban notified

## 2021-08-23 NOTE — Addendum Note (Signed)
Addended by: Myles Gip on: 08/23/2021 08:31 AM   Modules accepted: Orders

## 2021-09-20 ENCOUNTER — Ambulatory Visit: Payer: 59 | Admitting: Family Medicine

## 2021-09-20 ENCOUNTER — Encounter: Payer: Self-pay | Admitting: Family Medicine

## 2021-09-20 ENCOUNTER — Other Ambulatory Visit: Payer: Self-pay

## 2021-09-20 ENCOUNTER — Ambulatory Visit: Payer: Self-pay | Admitting: *Deleted

## 2021-09-20 VITALS — BP 132/78 | HR 68 | Temp 98.4°F | Resp 18 | Ht 63.0 in | Wt 148.0 lb

## 2021-09-20 DIAGNOSIS — J029 Acute pharyngitis, unspecified: Secondary | ICD-10-CM

## 2021-09-20 DIAGNOSIS — R051 Acute cough: Secondary | ICD-10-CM | POA: Diagnosis not present

## 2021-09-20 LAB — POCT RAPID STREP A (OFFICE): Rapid Strep A Screen: NEGATIVE

## 2021-09-20 MED ORDER — METHYLPREDNISOLONE 4 MG PO TBPK: ORAL_TABLET | ORAL | 0 refills | Status: DC

## 2021-09-20 MED ORDER — BENZONATATE 100 MG PO CAPS: 100.0000 mg | ORAL_CAPSULE | Freq: Three times a day (TID) | ORAL | 0 refills | Status: DC | PRN

## 2021-09-20 NOTE — Patient Instructions (Signed)
It was great to see you!  Our plans for today:  - Take the steroids as prescribed.   - We are checking some labs today, we will release these results to your MyChart.  Take care and seek immediate care sooner if you develop any concerns.   Dr. Ky Barban

## 2021-09-20 NOTE — Telephone Encounter (Signed)
Pt called in c/o coughing non stop.   Coughing up green mucus.  She has asthma too and has used all her albuterol inhaler.   She did a virtual visit a week ago.   She had a strep test that was positive.  She has completed all the antibiotics but the cough is getting worse.   She is c/o her chest hurting when she coughs from coughing so much over the last week.   She also has a  scratchy throat.  I made her an appt for today in office with Dr. Ky Barban for 2:20.

## 2021-09-20 NOTE — Progress Notes (Signed)
   SUBJECTIVE:   CHIEF COMPLAINT / HPI:   UPPER RESPIRATORY TRACT INFECTION - previously seen for same 11/1, rapid strep positive. S/p amox x10 days. - cough started about a week ago.   Fever: no Cough: yes, productive of phlegm Shortness of breath: no Chest pain: yes, with cough Chest tightness: no Chest congestion: yes Nasal congestion: yes Runny nose: yes Post nasal drip: yes Sore throat: yes Swollen glands: yes Sinus pressure: yes Headache: yes Ear pain: yes left Ear pressure: yes bilateral Vomiting: no Rash: no Fatigue: yes Sick contacts: no Relief with OTC cold/cough medications: yes, a little Treatments attempted:  albuterol x2, sudafed, just started back taking trelegy.   OBJECTIVE:   Pulse 68   Resp 18   Ht 5\' 3"  (1.6 m)   Wt 148 lb (67.1 kg)   SpO2 98%   BMI 26.22 kg/m   Gen: well appearing, in NAD HEENT: Tms visible bilaterally without erythema, bulging, purulence. Canals clear. Oropharynx slightly erythematous without petechiae, exudate, swelling.  Card: RRR Lungs: CTAB. No wheeze/rales. Ext: WWP, no edema  ASSESSMENT/PLAN:   URI Rapid strep negative, will send for culture given persistent sore throat though likely 2/2 postnasal drip from URI. Will provide medrol dose pack given lack of relief from oral decongestant. Would benefit from formal lung function testing as never formally diagnosed with asthma and to determine ongoing need for trelegy.   Myles Gip, DO

## 2021-09-20 NOTE — Telephone Encounter (Signed)
Reason for Disposition  [1] Coughing that's severe (nonstop) AND [2] keeps from playing or sleeping AND [3] not improved after 3 nebs OR 3 inhaler rescue treatments given 20 minutes apart  Answer Assessment - Initial Assessment Questions 1. SEVERITY: "How bad is this attack? Describe your child's breathing. What does it sound like?" * MILD: no SOB at rest, mild SOB with walking, speaks normally in sentences, can lay down flat,  no retractions, wheezes only heard by stethoscope (GREEN Zone: PEFR 80-100%)  * MODERATE: SOB at rest, speaks in phrases, prefers to sit (can't lay down flat), mild retractions, audible wheezing (YELLOW Zone: PEFR 50-80%) * SEVERE: severe SOB at rest, speaks in single words (struggling to breathe), severe retractions, usually loud wheezing or sometimes minimal wheezing because of decreased air movement (RED Zone: PEFR < 50%)  * MODERATE and SEVERE asthma attacks also interfere with normal activities and sleep (Reason: too hypoxic to sleep). SEVERE hypoxia can also cause confusion or altered mental status.      Pt calling in.  She has asthma and has been sick.   I'm non stop coughing on and off.   Scratchy throat.   My chest hurts every time I cough.   Been going on for 8 days now.    I'm negative for Covid.  I had a virtual visit a week ago.   I drove by the clinic and they tested me.  I've finished the antibiotic.   I coughing up green mucus.   My left ear is hurting too.   I was positive for strep and have finished the antibiotics for that.   I've used all the inhaler they gave me.   2. PEAK EXPIRATORY FLOW RATE (PEFR): "Do you use a peak flow meter?" If so, ask: "What's the current peak flow? What's your child's normal peak flow?" (AGE 50 years or older).     *No Answer* 3. ONSET: "When did this asthma attack start?"      Last week I've been sick.   The cough is worse and my chest hurts from coughing so much 4. TRIGGER: "What do you think triggered this attack?" (e.g. URI,  exposure to pollen or other allergen, tobacco smoke)      Being sick. I felt lightheaded this morning and every morning from coughing so much.   I'm tired going up the stairs that's not usual for me. 5. INHALED RESCUE MEDS (inhaler or nebs): "What is your child's asthma rescue medicine?" Note: The neb or inhaler rescue treatments listed in the triage questions refers to quick-relief SINGLE medicines such as albuterol, xopenex or salbutamol ([place]). CAUTION:  If patient is using a COMBINATION medicine (Symbicort, Dulera, Advair, Breo, AirDuo Respiclick) as a rescue medicine consult PCP for dosing more frequent than every 4 hours.      I've used my albuterol inhaler.    I can't remember the name of the other inhaler I use once a day.   6. INHALED STEROID: "Does your child also take an inhaled steroid (e.g., Pulmicort, Flovent, Qvar, etc )?" Controller or maintenance asthma medicines refer to anti-inflammatory medicines such as inhaled steroids or oral singulair. They are not helpful at reversing acute asthma attacks. However, controller medicines should be continued during the attack.     *No Answer* 7. INHALED TREATMENTS GIVEN: "What treatments have you given so far?" and "How often?" If using an inhaler, ask, "How many puffs?" Recommended SINGLE medicine rescue Inhaler Dosage: Routine treatments are 2 puffs every 4  hours as needed.  Rescue treatments are 4 puffs. NOTE: A routine "treatment" is defined as 1 neb treatment OR 2 or more puffs of SINGLE medicine rescue inhaler.  Back-to-back treatments are considered 2 or more SINGLE medicine treatments within a 2 hour period. CAUTION:  If patient is using a COMBINATION medicine (Symbicort, Dulera, Advair, Breo, AirDuo Respiclick) as a rescue medicine consult PCP for dosing more frequent than every 4 hours.      N/A 8. INHALER: "How long have you had this inhaler?" "Could it be empty?"      N/A 9. SPACER: "Do you have a spacer?" If yes, "Are you using it?"      N/A  Note to Triager - Respiratory Distress: Always rule out respiratory distress (also known as working hard to breathe or shortness of breath). Listen for grunting, stridor, wheezing, tachypnea in these calls. How to assess: Listen to the child's breathing early in your assessment. Reason: What you hear is often more valid than the caller's answers to your triage questions.  Protocols used: Asthma-P-AH

## 2021-09-22 LAB — CULTURE, GROUP A STREP
MICRO NUMBER:: 12699788
SPECIMEN QUALITY:: ADEQUATE

## 2021-09-22 LAB — SPECIMEN STATUS REPORT

## 2021-09-22 LAB — NOVEL CORONAVIRUS, NAA: SARS-CoV-2, NAA: NOT DETECTED

## 2021-12-06 ENCOUNTER — Encounter: Payer: Self-pay | Admitting: Obstetrics and Gynecology

## 2021-12-06 ENCOUNTER — Ambulatory Visit: Payer: 59 | Admitting: Obstetrics and Gynecology

## 2021-12-06 ENCOUNTER — Other Ambulatory Visit: Payer: Self-pay

## 2021-12-06 VITALS — BP 100/70 | Ht 62.0 in | Wt 149.0 lb

## 2021-12-06 DIAGNOSIS — R300 Dysuria: Secondary | ICD-10-CM

## 2021-12-06 DIAGNOSIS — N898 Other specified noninflammatory disorders of vagina: Secondary | ICD-10-CM | POA: Diagnosis not present

## 2021-12-06 LAB — POCT URINALYSIS DIPSTICK
Bilirubin, UA: NEGATIVE
Blood, UA: NEGATIVE
Glucose, UA: NEGATIVE
Ketones, UA: NEGATIVE
Leukocytes, UA: NEGATIVE
Nitrite, UA: NEGATIVE
Protein, UA: NEGATIVE
Spec Grav, UA: 1.02 (ref 1.010–1.025)
pH, UA: 5 (ref 5.0–8.0)

## 2021-12-06 LAB — POCT WET PREP WITH KOH
Clue Cells Wet Prep HPF POC: NEGATIVE
KOH Prep POC: NEGATIVE
Trichomonas, UA: NEGATIVE
Yeast Wet Prep HPF POC: NEGATIVE

## 2021-12-06 MED ORDER — FLUCONAZOLE 150 MG PO TABS: 150.0000 mg | ORAL_TABLET | Freq: Once | ORAL | 0 refills | Status: AC

## 2021-12-06 NOTE — Progress Notes (Signed)
Steele Sizer, MD   Chief Complaint  Patient presents with   Urinary Tract Infection    Urinary burning, pelvic pain x 3 weeks   Vaginal Irritation    Abnormal odor, little itching, no discharge x 1-2 weeks    HPI:      [name] is a 56 y.o. G2P2 whose LMP was No LMP recorded. Patient has had a hysterectomy., presents today for vaginal burning/discomfort for a couple wks, no increased d/c, but has noticed an odor. Also with dysuria, no frequency/urgency/hematuria. No LBP, fevers. No meds to treat sx. Uses dove soap and dryer sheets. Has been also having pelvic cramping intermittently for a couple wks, but has hx of IBS that can cause these sx. No recent diarrhea. Was on abx a couple months ago, not taking probiotics.  S/p LSH for leio; hx of chronic abd pain due to GI issues of IBS/GERD Hx of yeast in past.   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   Vitamin D deficiency 05/03/2015   IBS (irritable bowel syndrome) 05/03/2015    Past Surgical History:  Procedure Laterality Date   ABDOMINAL HYSTERECTOMY     COLONOSCOPY WITH PROPOFOL N/A 09/27/2017   Procedure: COLONOSCOPY WITH PROPOFOL;  Surgeon: Jonathon Bellows, MD;  Location: [facility] ENDOSCOPY;  Service: Gastroenterology;  Laterality: N/A;   ESOPHAGOGASTRODUODENOSCOPY (EGD) WITH PROPOFOL N/A 09/27/2017   Procedure: ESOPHAGOGASTRODUODENOSCOPY (EGD) WITH PROPOFOL;  Surgeon: Jonathon Bellows, MD;  Location: [facility] ENDOSCOPY;  Service: Gastroenterology;  Laterality: N/A;   TUBAL LIGATION      Family History  Problem Relation Age of Onset   Diabetes Mother    Hyperlipidemia Mother     Hypertension Mother    Liver disease Father    Cancer Brother 76       brain cancer   Breast cancer Neg Hx     Social History   Socioeconomic History   Marital status: Married    Spouse name: [name]   Number of children: 2   Years of education: Not on file   Highest education level: Associate degree: occupational, Hotel manager, or vocational program  Occupational History   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      Right handed    Two story home   Social Determinants of Radio broadcast assistant Strain: Low Risk    Difficulty of Paying Living Expenses: Not hard at all  Food Insecurity: No Food Insecurity   Worried About Charity fundraiser in the Last Year: Never true   Arboriculturist in the Last Year:  Never true  Transportation Needs: No Transportation Needs   Lack of Transportation (Medical): No   Lack of Transportation (Non-Medical): No  Physical Activity: Insufficiently Active   Days of Exercise per Week: 2 days   Minutes of Exercise per Session: 10 min  Stress: No Stress Concern Present   Feeling of Stress : Not at all  Social Connections: Moderately Integrated   Frequency of Communication with Friends and Family: More than three times a week   Frequency of Social Gatherings with Friends and Family: Once a week   Attends Religious Services: More than 4 times per year   Active Member of [employer] or Organizations: No   Attends Music therapist: Never    Marital Status: Married  Human resources officer Violence: Not At Risk   Fear of Current or Ex-Partner: No   Emotionally Abused: No   Physically Abused: No   Sexually Abused: No    Outpatient Medications Prior to Visit  Medication Sig Dispense Refill   albuterol (VENTOLIN HFA) 108 (90 Base) MCG/ACT inhaler Inhale 2 puffs into the lungs every 6 (six) hours as needed for wheezing or shortness of breath. 18 g 8   DULoxetine (CYMBALTA) 30 MG capsule Take 1 capsule (30 mg total) by mouth daily. 90 capsule 1   fexofenadine (ALLEGRA ALLERGY) 180 MG tablet Take 1 tablet (180 mg total) by mouth as needed. 30 tablet 5   fluticasone (FLONASE) 50 MCG/ACT nasal spray instill 2 spray into each nostril at bedtime as needed 16 g 2   Fluticasone-Umeclidin-Vilant 100-62.5-25 MCG/ACT AEPB Inhale into the lungs.     hydrocortisone (ANUSOL-HC) 2.5 % rectal cream Place 1 application rectally 2 (two) times daily. 30 g 0   hyoscyamine (LEVSIN) 0.125 MG tablet Take 1 tablet (0.125 mg total) by mouth every 4 (four) hours as needed. 100 tablet 1   benzonatate (TESSALON) 100 MG capsule Take 1 capsule (100 mg total) by mouth 3 (three) times daily as needed for cough. 20 capsule 0   methylPREDNISolone (MEDROL DOSEPAK) 4 MG TBPK tablet Day 1: Take 8 mg (2 tablets) before breakfast, 4 mg (1 tablet) after lunch, 4 mg (1 tablet) after supper, and 8 mg (2 tablets) at bedtime. Day 2:Take 4 mg (1 tablet) before breakfast, 4 mg (1 tablet) after lunch, 4 mg (1 tablet) after supper, and 8 mg (2 tablets) at bedtime. Day 3: Take 4 mg (1 tablet) before breakfast, 4 mg (1 tablet) after lunch, 4 mg (1 tablet) after supper, and 4 mg (1 tablet) at bedtime. Day 4: Take 4 mg (1 tablet) before breakfast, 4 mg (1 tablet) after lunch, and 4 mg (1 tablet) at bedtime. Day 5: Take 4 mg (1 tablet) before breakfast and 4 mg (1 tablet) at bedtime. Day 6: Take 4 mg (1 tablet) before breakfast. 21 tablet 0   No facility-administered medications prior to visit.       ROS:  Review of Systems  Constitutional:  Negative for fever.  Gastrointestinal:  Negative for blood in stool, constipation, diarrhea, nausea and vomiting.  Genitourinary:  Positive for dysuria and pelvic pain. Negative for dyspareunia, flank pain, frequency, hematuria, urgency, vaginal bleeding, vaginal discharge and vaginal pain.  Musculoskeletal:  Negative for back pain.  Skin:  Negative for rash.  BREAST: No symptoms   OBJECTIVE:   Vitals:  BP 100/70    Ht 5\' 2"  (1.575 m)    Wt 149 lb (67.6 kg)    BMI 27.25 kg/m   Physical Exam Vitals  reviewed.  Constitutional:      Appearance: She is well-developed.  Pulmonary:     Effort: Pulmonary effort is normal.  Abdominal:     Palpations: Abdomen is soft.     Tenderness: There is no abdominal tenderness.  Genitourinary:    General: Normal vulva.     Pubic Area: No rash.      Labia:        Right: No rash, tenderness or lesion.        Left: No rash, tenderness or lesion.      Vagina: Normal. No vaginal discharge, erythema or tenderness.     Cervix: Normal.     Uterus: Normal. Not enlarged and not tender.      Adnexa: Right adnexa normal and left adnexa normal.       Right: No mass or tenderness.         Left: No mass or tenderness.    Musculoskeletal:        General: Normal range of motion.     Cervical back: Normal range of motion.  Skin:    General: Skin is warm and dry.  Neurological:     General: No focal deficit present.     Mental Status: She is alert and oriented to person, place, and time.  Psychiatric:        Mood and Affect: Mood normal.        Behavior: Behavior normal.        Thought Content: Thought content normal.        Judgment: Judgment normal.    Results: Results for orders placed or performed in visit on 12/06/21 (from the past 24 hour(s))  POCT Wet Prep with KOH     Status: Normal   Collection Time: 12/06/21  4:44 PM  Result Value Ref Range   Trichomonas, UA Negative    Clue Cells Wet  Prep HPF POC neg    Epithelial Wet Prep HPF POC     Yeast Wet Prep HPF POC neg    Bacteria Wet Prep HPF POC     RBC Wet Prep HPF POC     WBC Wet Prep HPF POC     KOH Prep POC Negative Negative  POCT Urinalysis Dipstick     Status: Normal   Collection Time: 12/06/21  4:44 PM  Result Value Ref Range   Color, UA yellow    Clarity, UA clear    Glucose, UA Negative Negative   Bilirubin, UA neg    Ketones, UA neg    Spec Grav, UA 1.020 1.010 - 1.025   Blood, UA neg    pH, UA 5.0 5.0 - 8.0   Protein, UA Negative Negative   Urobilinogen, UA     Nitrite, UA neg    Leukocytes, UA Negative Negative   Appearance     Odor       Assessment/Plan: Vaginal irritation - Plan: fluconazole (DIFLUCAN) 150 MG tablet, POCT Wet Prep with KOH; neg exam and wet prep. Recent hx of abx. Treat empirically with diflucan, OTC hydrocortisone crm. Line dry underwear. F/u prn.   Dysuria - Plan: POCT Urinalysis Dipstick; neg UA. Most likely due to vaginitis. F/u prn.   Add probiotics for IBS/cramping. F/u prn.    Meds ordered this encounter  Medications   fluconazole (DIFLUCAN) 150 MG tablet    Sig: Take 1 tablet (150 mg total) by mouth once for 1 dose.    Dispense:  1 tablet    Refill:  0    Order Specific Question:   Supervising Provider    Answer:   Gae Dry [[id number]]      Return if symptoms worsen or fail to improve.  Amen Staszak B. Lehman Whiteley, PA-C 12/06/2021 4:47 PM

## 2022-01-05 ENCOUNTER — Ambulatory Visit: Payer: 59 | Admitting: Family Medicine

## 2022-01-05 ENCOUNTER — Encounter: Payer: Self-pay | Admitting: Family Medicine

## 2022-01-05 ENCOUNTER — Other Ambulatory Visit: Payer: Self-pay | Admitting: Family Medicine

## 2022-01-05 ENCOUNTER — Telehealth: Payer: Self-pay | Admitting: *Deleted

## 2022-01-05 VITALS — BP 124/68 | HR 101 | Resp 16 | Ht 63.0 in | Wt 150.0 lb

## 2022-01-05 DIAGNOSIS — K219 Gastro-esophageal reflux disease without esophagitis: Secondary | ICD-10-CM

## 2022-01-05 DIAGNOSIS — N951 Menopausal and female climacteric states: Secondary | ICD-10-CM

## 2022-01-05 DIAGNOSIS — J4521 Mild intermittent asthma with (acute) exacerbation: Secondary | ICD-10-CM

## 2022-01-05 DIAGNOSIS — M25542 Pain in joints of left hand: Secondary | ICD-10-CM

## 2022-01-05 DIAGNOSIS — J3089 Other allergic rhinitis: Secondary | ICD-10-CM

## 2022-01-05 DIAGNOSIS — K581 Irritable bowel syndrome with constipation: Secondary | ICD-10-CM

## 2022-01-05 DIAGNOSIS — F419 Anxiety disorder, unspecified: Secondary | ICD-10-CM | POA: Insufficient documentation

## 2022-01-05 DIAGNOSIS — M25541 Pain in joints of right hand: Secondary | ICD-10-CM

## 2022-01-05 DIAGNOSIS — M7702 Medial epicondylitis, left elbow: Secondary | ICD-10-CM

## 2022-01-05 DIAGNOSIS — J302 Other seasonal allergic rhinitis: Secondary | ICD-10-CM

## 2022-01-05 DIAGNOSIS — M7062 Trochanteric bursitis, left hip: Secondary | ICD-10-CM

## 2022-01-05 MED ORDER — OMEPRAZOLE 40 MG PO CPDR: 40.0000 mg | DELAYED_RELEASE_CAPSULE | Freq: Every day | ORAL | 0 refills | Status: DC

## 2022-01-05 MED ORDER — MONTELUKAST SODIUM 10 MG PO TABS: 10.0000 mg | ORAL_TABLET | Freq: Every day | ORAL | 1 refills | Status: DC

## 2022-01-05 MED ORDER — MELOXICAM 15 MG PO TABS: 15.0000 mg | ORAL_TABLET | Freq: Every day | ORAL | 0 refills | Status: DC

## 2022-01-05 MED ORDER — DULOXETINE HCL 30 MG PO CPEP: 30.0000 mg | ORAL_CAPSULE | Freq: Every day | ORAL | 1 refills | Status: DC

## 2022-01-05 MED ORDER — FLUTICASONE-UMECLIDIN-VILANT 100-62.5-25 MCG/ACT IN AEPB: 1.0000 | INHALATION_SPRAY | Freq: Every day | RESPIRATORY_TRACT | 2 refills | Status: DC

## 2022-01-05 MED ORDER — BENZONATATE 100 MG PO CAPS: 100.0000 mg | ORAL_CAPSULE | Freq: Two times a day (BID) | ORAL | 0 refills | Status: DC | PRN

## 2022-01-05 MED ORDER — HYOSCYAMINE SULFATE 0.125 MG PO TABS: 0.1250 mg | ORAL_TABLET | ORAL | 1 refills | Status: DC | PRN

## 2022-01-05 NOTE — Telephone Encounter (Signed)
Requested medication (s) are due for refill today - no ? ?Requested medication (s) are on the active medication list -yes ? ?Future visit scheduled -yes ? ?Last refill: today ? ?Notes to clinic: Pharmacy request 90 day supply- original Rx #[id number] no RF- request sent for review  ? ?Requested Prescriptions  ?Pending Prescriptions Disp Refills  ? meloxicam (MOBIC) 15 MG tablet [Pharmacy Med Name: MELOXICAM 15MG TABLETS] 90 tablet   ?  Sig: TAKE 1 TABLET(15 MG) BY MOUTH DAILY  ?  ? Analgesics:  COX2 Inhibitors Failed - 01/05/2022  3:32 PM  ?  ?  Failed - Manual Review: Labs are only required if the patient has taken medication for more than 8 weeks.  ?  ?  Passed - HGB in normal range and within 360 days  ?  Hemoglobin  ?Date Value Ref Range Status  ?08/08/2021 14.3 11.7 - 15.5 g/dL Final  ?04/19/2017 14.3 11.1 - 15.9 g/dL Final  ?  ?  ?  ?  Passed - Cr in normal range and within 360 days  ?  Creat  ?Date Value Ref Range Status  ?08/08/2021 0.67 0.50 - 1.03 mg/dL Final  ?  ?  ?  ?  Passed - HCT in normal range and within 360 days  ?  HCT  ?Date Value Ref Range Status  ?08/08/2021 42.3 35.0 - 45.0 % Final  ? ?Hematocrit  ?Date Value Ref Range Status  ?04/19/2017 42.7 34.0 - 46.6 % Final  ?  ?  ?  ?  Passed - AST in normal range and within 360 days  ?  AST  ?Date Value Ref Range Status  ?08/08/2021 15 10 - 35 U/L Final  ?  ?  ?  ?  Passed - ALT in normal range and within 360 days  ?  ALT  ?Date Value Ref Range Status  ?08/08/2021 15 6 - 29 U/L Final  ?  ?  ?  ?  Passed - eGFR is 30 or above and within 360 days  ?  GFR, Est African American  ?Date Value Ref Range Status  ?03/24/2020 118 > OR = 60 mL/min/1.51m Final  ? ?GFR, Est Non African American  ?Date Value Ref Range Status  ?03/24/2020 102 > OR = 60 mL/min/1.77mFinal  ? ?GFR, Estimated  ?Date Value Ref Range Status  ?12/16/2020 >60 >60 mL/min Final  ?  Comment:  ?  (NOTE) ?Calculated using the CKD-EPI Creatinine Equation (2021) ?  ? ?eGFR  ?Date Value Ref Range Status   ?08/08/2021 104 > OR = 60 mL/min/1.7355minal  ?  Comment:  ?  The eGFR is based on the CKD-EPI 2021 equation. To calculate  ?the new eGFR from a previous Creatinine or Cystatin C ?result, go to https://www.kidney.org/professionals/ ?kdoqi/gfr%5Fcalculator ?  ?  ?  ?  ?  Passed - Patient is not pregnant  ?  ?  Passed - Valid encounter within last 12 months  ?  Recent Outpatient Visits   ? ?      ? Today Mild intermittent reactive airway disease with acute exacerbation  ? [facility]  ? 3 months ago Sore throat  ? [facility]  ? 4 months ago Sore throat  ? [facility]  ? 5 months ago Well adult exam  ? [facility]  ? 10 months ago Mild intermittent reactive airway  disease without complication  ? [facility], Laurita Quint, FNP  ? ?  ?  ?Future Appointments   ? ?        ? In 3 months Steele Sizer, MD [facility], [place]  ? In 7 months Steele Sizer, MD [place]  ? ?  ? ?  ?  ?  ? omeprazole (PRILOSEC) 40 MG capsule [Pharmacy Med Name: OMEPRAZOLE 40MG CAPSULES] 90 capsule   ?  Sig: TAKE 1 CAPSULE(40 MG) BY MOUTH DAILY  ?  ? Gastroenterology: Proton Pump Inhibitors Passed - 01/05/2022  3:32 PM  ?  ?  Passed - Valid encounter within last 12 months  ?  Recent Outpatient Visits   ? ?      ? Today Mild intermittent reactive airway disease with acute exacerbation  ? [facility], Drue Stager, MD  ? 3 months ago Sore throat  ? [place], DO  ? 4 months ago Sore throat  ? [place], DO  ? 5 months ago Well adult exam  ? [facility] Steele Sizer, MD  ? 10 months ago Mild intermittent reactive airway disease without complication  ? [facility] Just, Laurita Quint, FNP  ? ?  ?  ?Future Appointments   ? ?        ? In 3 months Steele Sizer, MD [facility], [place]  ? In 7 months Steele Sizer, MD [facility], [place]  ? ?  ? ?  ?  ?  ? ? ? ?Requested Prescriptions  ?Pending Prescriptions Disp Refills  ? meloxicam (MOBIC) 15 MG tablet [Pharmacy Med Name: MELOXICAM 15MG TABLETS] 90 tablet   ?  Sig: TAKE 1 TABLET(15 MG) BY MOUTH DAILY  ?  ? Analgesics:  COX2 Inhibitors Failed - 01/05/2022  3:32 PM  ?  ?  Failed - Manual Review: Labs are only required if the patient has taken medication for more than 8 weeks.  ?  ?  Passed - HGB in normal range and within 360 days  ?  Hemoglobin  ?Date Value Ref Range Status  ?08/08/2021 14.3 11.7 - 15.5 g/dL Final  ?04/19/2017 14.3 11.1 - 15.9 g/dL Final  ?  ?  ?  ?  Passed - Cr in normal range and within 360 days  ?  Creat  ?Date Value Ref Range Status  ?08/08/2021 0.67 0.50 - 1.03 mg/dL Final  ?  ?  ?  ?  Passed - HCT in normal range and within 360 days  ?  HCT  ?Date Value Ref Range Status  ?08/08/2021 42.3 35.0 - 45.0 % Final  ? ?Hematocrit  ?Date Value Ref Range Status  ?04/19/2017 42.7 34.0 - 46.6 % Final  ?  ?  ?  ?  Passed - AST in normal range and within 360 days  ?  AST  ?Date Value Ref Range Status  ?08/08/2021 15 10 - 35 U/L Final  ?  ?  ?  ?  Passed - ALT in normal range and within 360 days  ?  ALT  ?Date Value Ref Range Status  ?08/08/2021 15 6 - 29 U/L Final  ?  ?  ?  ?  Passed - eGFR is 30 or above and within 360 days  ?  GFR, Est African American  ?Date Value Ref Range Status  ?03/24/2020  118 > OR = 60 mL/min/1.31m Final  ? ?GFR, Est Non African American  ?Date Value Ref Range Status  ?03/24/2020 102 > OR = 60 mL/min/1.747mFinal  ? ?GFR, Estimated  ?Date Value Ref Range Status  ?12/16/2020 >60 >60 mL/min Final  ?  Comment:  ?  (NOTE) ?Calculated using the CKD-EPI Creatinine Equation (2021) ?  ? ?eGFR  ?Date Value Ref Range Status  ?08/08/2021 104 > OR = 60 mL/min/1.7360minal  ?  Comment:  ?   The eGFR is based on the CKD-EPI 2021 equation. To calculate  ?the new eGFR from a previous Creatinine or Cystatin C ?result, go to https://www.kidney.org/professionals/ ?kdoqi/gfr%5Fcalculator ?  ?  ?  ?  ?  Passed - Patient is not pregnant  ?  ?  Passed - Valid encounter within last 12 months  ?  Recent Outpatient Visits   ? ?      ? Today Mild intermittent reactive airway disease with acute exacerbation  ? [facility]  ? 3 months ago Sore throat  ? [facility]  ? 4 months ago Sore throat  ? [facility]  ? 5 months ago Well adult exam  ? [facility]  ? 10 months ago Mild intermittent reactive airway disease without complication  ? [facility] [place] Quint[place]  ? ?  ?  ?Future Appointments   ? ?        ? In 3 months SowSteele SizerD [facility] In 7 months SowSteele SizerD [facility] ?  ? ?  ?  ?  ? omeprazole (PRILOSEC) 40 MG capsule [Pharmacy Med Name: OMEPRAZOLE 40MG CAPSULES] 90 capsule   ?  Sig: TAKE 1 CAPSULE(40 MG) BY MOUTH DAILY  ?  ? Gastroenterology: Proton Pump Inhibitors Passed - 01/05/2022  3:32 PM  ?  ?  Passed - Valid encounter within last 12 months  ?  Recent Outpatient Visits   ? ?      ? Today Mild intermittent reactive airway disease with acute exacerbation  ? [facility]  ? 3 months ago Sore throat  ? [facility]  ? 4 months ago Sore throat  ? [facility]  ? 5 months ago Well adult exam  ? [facility]  ? 10 months ago Mild intermittent reactive airway disease without complication  ? [facility] [place] Quint[facility]  ? ?  ?  ?Future Appointments   ? ?        ? In 3 months  SowSteele SizerD [facility] In 7 months SowSteele SizerD [facility] ?  ? ?  ?  ?  ? ? ? ?

## 2022-01-05 NOTE — Patient Instructions (Addendum)
Hip Bursitis ?Hip bursitis is the inflammation of one or more bursae in the hip joint. Bursae are small fluid-filled sacs that absorb shock and prevent bones from rubbing against each other. ?Hip bursitis can cause mild to moderate pain, and symptoms often come and go over time. ?What are the causes? ?This condition results from increased friction between the hip bones and the tendons around the hip joint. This condition can happen if you: ?Overuse your hip muscles. ?Injure your hip. ?Have weak buttocks muscles. ?Have bone spurs. ?Have an infection. ?In some cases, the cause may not be known. ?What increases the risk? ?You are more likely to develop this condition if: ?You injured your hip previously or had hip surgery. ?You have a medical condition, such as arthritis, gout, diabetes, or thyroid disease. ?You have spine problems. ?You have one leg that is shorter than the other. ?You participate in athletic activities that include repetitive motion, like running. ?You participate in sports where there is a risk of injury or falling, such as football, martial arts, or skiing. ?What are the signs or symptoms? ?Symptoms may come and go, and they often include: ?Pain in the hip or groin area. Pain may get worse with movement. ?Tenderness and swelling of the hip. ?In rare cases, the bursa may become infected. If this happens, you may get a fever, as well as warmth and redness in the hip area. ?How is this diagnosed? ?This condition may be diagnosed based on: ?Your symptoms. ?Your medical history. ?A physical exam. ?Imaging tests, such as: ?X-rays to check your bones. ?MRI or ultrasound to check your tendons and muscles. ?Bone scan. ?A biopsy to remove fluid from your inflamed bursa for testing. ?How is this treated? ?This condition is treated by resting, icing, applying pressure (compression), and raising (elevating) the injured area. This is called RICE treatment. ?In some cases, RICE treatment may not be enough to make  your symptoms go away. Treatment may also include: ?Taking medicine to help with swelling and pain. ?Using crutches, a cane, or a walker to decrease the strain on your hip. ?Getting a shot of cortisone medicine to help reduce swelling. ?Taking other medicines if the bursa is infected. ?Draining fluid out of the bursa to help relieve swelling. ?Having surgery to remove a damaged or infected bursa. This is rare. ?Long-term treatment may include: ?Physical therapy exercises for strength and flexibility. ?Lifestyle changes, such as weight loss, to reduce the strain on the hip. ?Follow these instructions at home: ?Managing pain, stiffness, and swelling ?  ?If directed, put ice on the painful area. ?Put ice in a plastic bag. ?Place a towel between your skin and the bag. ?Leave the ice on for 20 minutes, 2-3 times a day. ?Raise (elevate) your hip as much as you can without pain. To do this, put a pillow under your hips while you lie down. ?If directed, apply heat to the affected area as often as told by your health care provider. Use the heat source that your health care provider recommends, such as a moist heat pack or a heating pad. ?Place a towel between your skin and the heat source. ?Leave the heat on for 20-30 minutes. ?Remove the heat if your skin turns bright red. This is especially important if you are unable to feel pain, heat, or cold. You may have a greater risk of getting burned. ?Activity ?Do not use your hip to support your body weight until your health care provider says that you can. Use crutches,  a cane, or a walker as told by your health care provider. ?If the affected leg is one that you use to drive, ask your health care provider if it is safe to drive. ?Rest and protect your hip as much as possible until your pain and swelling get better. ?Return to your normal activities as told by your health care provider. Ask your health care provider what activities are safe for you. ?Do exercises as told by your  health care provider. ?General instructions ?Take over-the-counter and prescription medicines only as told by your health care provider. ?Gently massage and stretch your injured area as often as is comfortable. ?Wear compression wraps only as told by your health care provider. ?If one of your legs is shorter than the other, get fitted for a shoe insert or orthotic. ?Maintain a healthy weight. Follow instructions from your health care provider for weight control. These may include dietary restrictions. ?Keep all follow-up visits as told by your health care provider. This is important. ?How is this prevented? ?Exercise regularly, as told by your health care provider. ?Wear supportive footwear that is appropriate for your sport. ?Warm up and stretch before being active. Cool down and stretch after being active. ?Take breaks regularly from repetitive activity. ?If an activity irritates your hip or causes pain, avoid the activity as much as possible. ?Avoid sitting down for long periods at a time. ?Where to find more information ?American Academy [employer]opa[employer]: orthoinfo.aaos.org ?Contact a health care provider if: ?You have a fever. ?You develop new symptoms. ?You have trouble walking or doing everyday activities. ?You have pain that gets worse or does not get better with medicine. ?You develop red skin or a feeling of warmth in your hip area. ?Get help right away if: ?You cannot move your hip. ?You have severe pain. ?You cannot control the muscles in your feet. ?Summary ?Hip bursitis is the inflammation of one or more bursae in the hip joint. Bursae are small fluid-filled sacs that absorb shock and prevent bones from rubbing against each other. ?Hip bursitis can cause hip or groin pain, and symptoms often come and go over time. ?This condition is often treated by resting, icing, applying pressure (compression), and raising (elevating) the injured area. Other treatments may be needed. ?This information is not  intended to replace advice given to you by your health care provider. Make sure you discuss any questions you have with your health care provider. ?Document Revised: 08/10/2019 Document Reviewed: 06/16/2018 ?Elsevier Patient Education ? [place]. ?Tennis Elbow ?Tennis elbow (lateral epicondylitis) is inflammation of tendons in your outer forearm, near your elbow. Tendons are tissues that connect muscle to bone. When you have tennis elbow, inflammation affects the tendons that you use to bend your wrist and move your hand up. Inflammation occurs in the lower part of the upper arm bone (humerus), where the tendons connect to the bone (lateral epicondyle). ?Tennis elbow often affects people who play tennis, but anyone may get the condition from repeatedly extending the wrist or turning the forearm. ?What are the causes? ?This condition is usually caused by repeatedly extending the wrist, turning the forearm, and using the hands. It can result from sports or work that requires repetitive forearm movements. In some cases, it may be caused by a sudden injury. ?What increases the risk? ?You are more likely to develop tennis elbow if you play tennis or another racket sport. You also have a higher risk if you frequently use your hands for work.  Besides people who play tennis, others at greater risk include: ?People who use computers. ?Architect workers. ?People who work in [employer]. ?Musicians. ?Cooks. ?Cashiers. ?What are the signs or symptoms? ?Symptoms of this condition include: ?Pain and tenderness in the forearm and the outer part of the elbow. Pain may be felt only when using the arm, or it may be there all the time. ?A burning feeling that starts in the elbow and spreads down the forearm. ?A weak grip in the hand. ?How is this diagnosed? ?This condition is diagnosed based on your symptoms, your medical history, and a physical exam. ?You may also have X-rays or an MRI to: ?Confirm the diagnosis. ?Look for  other issues. ?Check for tears in the ligaments, muscles, or tendons. ?How is this treated? ?Resting and icing your arm is often the first treatment. Your health care provider may also recommend: ?M

## 2022-01-05 NOTE — Progress Notes (Signed)
Name: Helen Green   MRN: [id number]    DOB: [birth year]   Date:01/05/2022 ? ?     Progress Note ? ?Subjective ? ?Chief Complaint ? ?Follow Up/ Arm Pain ? ?HPI ?  ?Hot flashes: she reached menopause about 10 years ago, she started on duloxetine Feb 2021 and is doing well now, hot flashes not as intense and would like a refill of medications  ?  ?GERD: she was off Dexilant but symptoms started to get worse again, with bloating and burning sensation and regurgitation. Evaluated by Dr. Marius Ditch  ? ?IBS with diarrhea/constipation: doing better now, she takes Levsin prn  ? ?Left outer hip pain: she states she has noticed pain on left lateral hip when she gets out of her car that goes away when she walks.  ? ?Hand pains: states symptoms started with pain on left medial arm for the past few months but now is noticing pain on both hands, feels aching all the time, difficulty getting comfortable at night , mild puffiness in the mornings and feels stiff and sometimes numb. She has intermittent redness and swelling of fingers.  She tried Tylenol. Pain on hands is constant and aching like.  ? ?RAD: she used to have a cough that would last after an URI, she states she has been having recurrent coughing spells for the past months, the cough is dry but she feels congested in her chest. This episode started yesterday and this morning it felt forceful and caused a pain all over her chest . No fever or chills. No nausea or vomiting. She has normal appetite.  ? 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 She is taking Duloxetine 30 mg and is still doing well on medication  ? ?Patient Active Problem List  ? Diagnosis Date Noted  ? Reactive airway disease 07/05/2020  ? 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 Insomnia, persistent 05/03/2015  ? Functional dyspepsia 05/03/2015  ? Gastro-esophageal reflux disease without esophagitis 05/03/2015  ? Perennial allergic rhinitis with seasonal variation  05/03/2015  ? 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 Vitamin D deficiency 05/03/2015  ? IBS (irritable bowel syndrome) 05/03/2015  ? ? ?Past Surgical History:  ?Procedure Laterality Date  ? ABDOMINAL HYSTERECTOMY    ? COLONOSCOPY WITH PROPOFOL N/A 09/27/2017  ? Procedure: COLONOSCOPY WITH PROPOFOL;  Surgeon: Jonathon Bellows, MD;  Location: [facility] ENDOSCOPY;  Service: Gastroenterology;  Laterality: N/A;  ? ESOPHAGOGASTRODUODENOSCOPY (EGD) WITH PROPOFOL N/A 09/27/2017  ? Procedure: ESOPHAGOGASTRODUODENOSCOPY (EGD) WITH PROPOFOL;  Surgeon: Jonathon Bellows, MD;  Location: [facility] ENDOSCOPY;  Service: Gastroenterology;  Laterality: N/A;  ? TUBAL LIGATION    ? ? ?Family History  ?Problem Relation Age of Onset  ? Diabetes Mother   ? Hyperlipidemia Mother   ? Hypertension Mother   ? Liver disease Father   ? Cancer Brother 20  ?     brain cancer  ? Breast cancer Neg Hx   ? ? ?Social History  ? ?Tobacco Use  ? Smoking status: Never  ? Smokeless tobacco: Never  ?Substance Use Topics  ? Alcohol use: No  ?  Alcohol/week: 0.0 standard drinks  ? ? ? ?Current Outpatient Medications:  ?  albuterol (VENTOLIN HFA) 108 (90 Base) MCG/ACT inhaler, Inhale 2 puffs into the lungs every 6 (six) hours as needed for wheezing or shortness of breath., Disp: 18 g, Rfl: 8 ?  DULoxetine (CYMBALTA) 30 MG capsule, Take 1 capsule (30 mg total) by mouth daily.,  Disp: 90 capsule, Rfl: 1 ?  fexofenadine (ALLEGRA ALLERGY) 180 MG tablet, Take 1 tablet (180 mg total) by mouth as needed., Disp: 30 tablet, Rfl: 5 ?  fluticasone (FLONASE) 50 MCG/ACT nasal spray, instill 2 spray into each nostril at bedtime as needed, Disp: 16 g, Rfl: 2 ?  Fluticasone-Umeclidin-Vilant 100-62.5-25 MCG/ACT AEPB, Inhale into the lungs., Disp: , Rfl:  ?  hydrocortisone (ANUSOL-HC) 2.5 % rectal cream, Place 1 application rectally 2 (two) times daily., Disp: 30 g, Rfl: 0 ?  hyoscyamine (LEVSIN) 0.125 MG tablet, Take 1 tablet (0.125 mg total) by mouth every 4 (four) hours as needed.,  Disp: 100 tablet, Rfl: 1 ? ?Allergies  ?Allergen Reactions  ? Apple Juice Hives, Shortness Of Breath and Swelling  ? Avocado Hives, Shortness Of Breath and Swelling  ?  and raw fruit and vegetables  ? Daucus Carota Hives, Shortness Of Breath and Swelling  ? Other Hives, Shortness Of Breath and Swelling  ?  Pears, avacados, nuts  ? ? ?I personally reviewed active problem list, medication list, allergies, family history, social history, health maintenance with the patient/caregiver today. ? ? ?ROS ? ?Ten systems reviewed and is negative except as mentioned in HPI  ? ?Objective ? ?Vitals:  ? 01/05/22 1434  ?BP: 124/68  ?Pulse: (!) 101  ?Resp: 16  ?SpO2: 96%  ?Weight: 150 lb (68 kg)  ?Height: '5\' 3"'$  (1.6 m)  ? ? ?Body mass index is 26.57 kg/m?. ? ?Physical Exam ? ?Constitutional: Patient appears well-developed and well-nourished.  No distress.  ?HEENT: head atraumatic, normocephalic, pupils equal and reactive to light, neck supple ?Cardiovascular: Normal rate, regular rhythm and normal heart sounds.  No murmur heard. No BLE edema. ?Pulmonary/Chest: Effort normal  end expiratory rhonchi and wheezing  No respiratory distress. ?Abdominal: Soft.  There is no tenderness. ?Psychiatric: Patient has a normal mood and affect. behavior is normal. Judgment and thought content normal.  ?Muscular skeletal: tender lateral epicondyle and left trochanteric bursa, no swelling of fingers  ? ?Recent Results (from the past 2160 hour(s))  ?POCT Wet Prep with KOH     Status: Normal  ? Collection Time: 12/06/21  4:44 PM  ?Result Value Ref Range  ? Trichomonas, UA Negative   ? Clue Cells Wet Prep HPF POC neg   ? Epithelial Wet Prep HPF POC    ? Yeast Wet Prep HPF POC neg   ? Bacteria Wet Prep HPF POC    ? RBC Wet Prep HPF POC    ? WBC Wet Prep HPF POC    ? KOH Prep POC Negative Negative  ?POCT Urinalysis Dipstick     Status: Normal  ? Collection Time: 12/06/21  4:44 PM  ?Result Value Ref Range  ? Color, UA yellow   ? Clarity, UA clear   ?  Glucose, UA Negative Negative  ? Bilirubin, UA neg   ? Ketones, UA neg   ? Spec Grav, UA 1.020 1.010 - 1.025  ? Blood, UA neg   ? pH, UA 5.0 5.0 - 8.0  ? Protein, UA Negative Negative  ? Urobilinogen, UA    ? Nitrite, UA neg   ? Leukocytes, UA Negative Negative  ? Appearance    ? Odor    ? ? ?PHQ2/9: ?Depression screen [facility] 2/9 01/05/2022 09/20/2021 08/22/2021 08/08/2021 02/17/2021  ?Decreased Interest 0 0 0 0 0  ?Down, Depressed, Hopeless 0 0 0 0 0  ?PHQ - 2 Score 0 0 0 0 0  ?Altered sleeping 0 0  0 0 0  ?Tired, decreased energy 0 0 0 0 0  ?Change in appetite 0 0 0 0 0  ?Feeling bad or failure about yourself  0 0 0 0 0  ?Trouble concentrating 0 0 0 0 0  ?Moving slowly or fidgety/restless 0 0 0 0 0  ?Suicidal thoughts 0 0 0 0 0  ?PHQ-9 Score 0 0 0 0 0  ?Difficult doing work/chores - Not difficult at all Not difficult at all - -  ?Some recent data might be hidden  ?  ?phq 9 is negative ? ? ?Fall Risk: ?Fall Risk  01/05/2022 09/20/2021 08/22/2021 08/08/2021 02/17/2021  ?Falls in the past year? 0 0 0 0 1  ?Number falls in past yr: 0 0 0 0 1  ?Injury with Fall? 0 0 0 0 0  ?Risk for fall due to : No Fall Risks - - No Fall Risks History of fall(s)  ?Follow up Falls prevention discussed - - Falls prevention discussed Falls evaluation completed  ? ? ? ? ?Functional Status Survey: ?Is the patient deaf or have difficulty hearing?: No ?Does the patient have difficulty seeing, even when wearing glasses/contacts?: No ?Does the patient have difficulty concentrating, remembering, or making decisions?: No ?Does the patient have difficulty walking or climbing stairs?: No ?Does the patient have difficulty dressing or bathing?: No ?Does the patient have difficulty doing errands alone such as visiting a doctor's office or shopping?: No ? ? ? ?Assessment & Plan ? ?1. Mild intermittent reactive airway disease with acute exacerbation ? ?- Fluticasone-Umeclidin-Vilant 100-62.5-25 MCG/ACT AEPB; Inhale 1 each into the lungs daily at 12 noon.   Dispense: 60 each; Refill: 2 ?- montelukast (SINGULAIR) 10 MG tablet; Take 1 tablet (10 mg total) by mouth at bedtime.  Dispense: 90 tablet; Refill: 1 ? ?2. Anxiety ? ?- DULoxetine (CYMBALTA) 30 MG capsule; Take 1 ca

## 2022-01-05 NOTE — Telephone Encounter (Signed)
Pt had hung up prior to transfer to nurse for complaints of chest pain. Nurse called pt back and she states she is only sore from coughing and her appt was already moved up to today. Nothing else needed per pt. ?

## 2022-01-05 NOTE — Progress Notes (Deleted)
Name: Helen Green   MRN: [id number]    DOB: 25-Mar-1966   Date:01/05/2022 ? ?     Progress Note ? ?Subjective ? ?Chief Complaint ? ?Follow Up/ Arm Pain ? ?HPI ? ?Intermittent low back pain: she continues to have lower back pain, episodes are intermittent, it has been aggravated over the past few weeks because she picked a heavy pot , she states pain is dull 4/10. She still has some baclofen at home.  ?  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 She is taking Duloxetine 30 mg and is still doing well on medication She states now she has been able to leave things undone. Not as obsessed about having dishes done before going to bed.  ?  ?Hot flashes: she reached menopause about 10 years ago, she started on duloxetine Feb 2021 and is doing well now, hot flashes not as intense, she needs a refill today  ?  ?GERD: she was off Dexilant but symptoms started to get worse again, with bloating and burning sensation and regurgitation,  so Dr. Marius Ditch called in [place] and she states she is starting to feel better.  ? ?IBS with diarrhea/constipation  and hemorrhoids: seen by Dr. Marius Ditch, she had a band but is having recurrence of hemorrhoids and some bleeding, Dr. Marius Ditch recently sent a refill of Anusol HC and hemorrhoids has improved but she is thinking about having surgery again .  ? ?RAD: she has a dry cough that can last weeks after an URI, sometimes just during allergy season, she has ventolin at home to use prn, currently no wheezing, cough  or sob ? ?Patient Active Problem List  ? Diagnosis Date Noted  ? Reactive airway disease 07/05/2020  ? 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 Insomnia, persistent 05/03/2015  ? Functional dyspepsia 05/03/2015  ? Gastro-esophageal reflux disease without esophagitis 05/03/2015  ? Perennial allergic rhinitis with seasonal variation 05/03/2015  ? 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 Vitamin D deficiency 05/03/2015  ? IBS (irritable  bowel syndrome) 05/03/2015  ? ? ?Past Surgical History:  ?Procedure Laterality Date  ? ABDOMINAL HYSTERECTOMY    ? COLONOSCOPY WITH PROPOFOL N/A 09/27/2017  ? Procedure: COLONOSCOPY WITH PROPOFOL;  Surgeon: Jonathon Bellows, MD;  Location: [facility] ENDOSCOPY;  Service: Gastroenterology;  Laterality: N/A;  ? ESOPHAGOGASTRODUODENOSCOPY (EGD) WITH PROPOFOL N/A 09/27/2017  ? Procedure: ESOPHAGOGASTRODUODENOSCOPY (EGD) WITH PROPOFOL;  Surgeon: Jonathon Bellows, MD;  Location: [facility] ENDOSCOPY;  Service: Gastroenterology;  Laterality: N/A;  ? TUBAL LIGATION    ? ? ?Family History  ?Problem Relation Age of Onset  ? Diabetes Mother   ? Hyperlipidemia Mother   ? Hypertension Mother   ? Liver disease Father   ? Cancer Brother 36  ?     brain cancer  ? Breast cancer Neg Hx   ? ? ?Social History  ? ?Tobacco Use  ? Smoking status: Never  ? Smokeless tobacco: Never  ?Substance Use Topics  ? Alcohol use: No  ?  Alcohol/week: 0.0 standard drinks  ? ? ? ?Current Outpatient Medications:  ?  albuterol (VENTOLIN HFA) 108 (90 Base) MCG/ACT inhaler, Inhale 2 puffs into the lungs every 6 (six) hours as needed for wheezing or shortness of breath., Disp: 18 g, Rfl: 8 ?  DULoxetine (CYMBALTA) 30 MG capsule, Take 1 capsule (30 mg total) by mouth daily., Disp: 90 capsule, Rfl: 1 ?  fexofenadine (ALLEGRA ALLERGY) 180 MG tablet, Take 1 tablet (180 mg total) by  mouth as needed., Disp: 30 tablet, Rfl: 5 ?  fluticasone (FLONASE) 50 MCG/ACT nasal spray, instill 2 spray into each nostril at bedtime as needed, Disp: 16 g, Rfl: 2 ?  Fluticasone-Umeclidin-Vilant 100-62.5-25 MCG/ACT AEPB, Inhale into the lungs., Disp: , Rfl:  ?  hydrocortisone (ANUSOL-HC) 2.5 % rectal cream, Place 1 application rectally 2 (two) times daily., Disp: 30 g, Rfl: 0 ?  hyoscyamine (LEVSIN) 0.125 MG tablet, Take 1 tablet (0.125 mg total) by mouth every 4 (four) hours as needed., Disp: 100 tablet, Rfl: 1 ? ?Allergies  ?Allergen Reactions  ? Apple Juice Hives, Shortness Of Breath and Swelling  ?  Avocado Hives, Shortness Of Breath and Swelling  ?  and raw fruit and vegetables  ? Daucus Carota Hives, Shortness Of Breath and Swelling  ? Other Hives, Shortness Of Breath and Swelling  ?  Pears, avacados, nuts  ? ? ?I personally reviewed active problem list, medication list, allergies, family history, social history, health maintenance with the patient/caregiver today. ? ? ?ROS ? ?*** ? ?Objective ? ?There were no vitals filed for this visit. ? ?There is no height or weight on file to calculate BMI. ? ?Physical Exam ?*** ? ?Recent Results (from the past 2160 hour(s))  ?POCT Wet Prep with KOH     Status: Normal  ? Collection Time: 12/06/21  4:44 PM  ?Result Value Ref Range  ? Trichomonas, UA Negative   ? Clue Cells Wet Prep HPF POC neg   ? Epithelial Wet Prep HPF POC    ? Yeast Wet Prep HPF POC neg   ? Bacteria Wet Prep HPF POC    ? RBC Wet Prep HPF POC    ? WBC Wet Prep HPF POC    ? KOH Prep POC Negative Negative  ?POCT Urinalysis Dipstick     Status: Normal  ? Collection Time: 12/06/21  4:44 PM  ?Result Value Ref Range  ? Color, UA yellow   ? Clarity, UA clear   ? Glucose, UA Negative Negative  ? Bilirubin, UA neg   ? Ketones, UA neg   ? Spec Grav, UA 1.020 1.010 - 1.025  ? Blood, UA neg   ? pH, UA 5.0 5.0 - 8.0  ? Protein, UA Negative Negative  ? Urobilinogen, UA    ? Nitrite, UA neg   ? Leukocytes, UA Negative Negative  ? Appearance    ? Odor    ? ? ?PHQ2/9: ?Depression screen [facility] 2/9 09/20/2021 08/22/2021 08/08/2021 02/17/2021 12/21/2020  ?Decreased Interest 0 0 0 0 0  ?Down, Depressed, Hopeless 0 0 0 0 0  ?PHQ - 2 Score 0 0 0 0 0  ?Altered sleeping 0 0 0 0 0  ?Tired, decreased energy 0 0 0 0 2  ?Change in appetite 0 0 0 0 0  ?Feeling bad or failure about yourself  0 0 0 0 0  ?Trouble concentrating 0 0 0 0 0  ?Moving slowly or fidgety/restless 0 0 0 0 0  ?Suicidal thoughts 0 0 0 0 0  ?PHQ-9 Score 0 0 0 0 2  ?Difficult doing work/chores Not difficult at all Not difficult at all - - -  ?Some recent data might be  hidden  ?  ?phq 9 is {gen pos neg:[id number]} ? ? ?Fall Risk: ?Fall Risk  09/20/2021 08/22/2021 08/08/2021 02/17/2021 12/21/2020  ?Falls in the past year? 0 0 0 1 0  ?Number falls in past yr: 0 0 0 1 0  ?Injury with Fall? 0 0  0 0 0  ?Risk for fall due to : - - No Fall Risks History of fall(s) -  ?Follow up - - Falls prevention discussed Falls evaluation completed -  ? ? ? ? ?Functional Status Survey: ?  ? ? ? ?Assessment & Plan ? ?*** ?There are no diagnoses linked to this encounter.  ?

## 2022-01-08 ENCOUNTER — Ambulatory Visit: Payer: 59 | Admitting: Family Medicine

## 2022-02-11 ENCOUNTER — Other Ambulatory Visit: Payer: Self-pay | Admitting: Family Medicine

## 2022-02-11 DIAGNOSIS — K219 Gastro-esophageal reflux disease without esophagitis: Secondary | ICD-10-CM

## 2022-04-16 NOTE — Progress Notes (Deleted)
Name: Helen Green   MRN: [id number]    DOB: [birth year]   Date:04/16/2022       Progress Note  Subjective  Chief Complaint  Follow Up  HPI  Hot flashes: she reached menopause about 10 years ago, she started on duloxetine Feb 2021 and is doing well now, hot flashes not as intense and would like a refill of medications    GERD: she was off Dexilant but symptoms started to get worse again, with bloating and burning sensation and regurgitation. Evaluated by Dr. Marius Ditch   IBS with diarrhea/constipation: doing better now, she takes Levsin prn   Left outer hip pain: she states she has noticed pain on left lateral hip when she gets out of her car that goes away when she walks.   Hand pains: states symptoms started with pain on left medial arm for the past few months but now is noticing pain on both hands, feels aching all the time, difficulty getting comfortable at night , mild puffiness in the mornings and feels stiff and sometimes numb. She has intermittent redness and swelling of fingers.  She tried Tylenol. Pain on hands is constant and aching like.   RAD: she used to have a cough that would last after an URI, she states she has been having recurrent coughing spells for the past months, the cough is dry but she feels congested in her chest. This episode started yesterday and this morning it felt forceful and caused a pain all over her chest . No fever or chills. No nausea or vomiting. She has normal appetite.   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 ENDOSCOPY;  Service: Gastroenterology;  Laterality: N/A;   TUBAL LIGATION      Family History  Problem Relation Age of Onset   Diabetes Mother    Hyperlipidemia Mother    Hypertension Mother    Liver disease Father    Cancer Brother 16       brain cancer   Breast cancer Neg Hx     Social History   Tobacco Use   Smoking status: Never   Smokeless tobacco: Never  Substance Use Topics   Alcohol use: No    Alcohol/week: 0.0 standard drinks of alcohol     Current Outpatient Medications:    albuterol (VENTOLIN HFA) 108 (90 Base) MCG/ACT inhaler, Inhale 2 puffs into the lungs every 6 (six) hours as needed for wheezing or shortness of breath., Disp: 18 g, Rfl: 8   benzonatate (TESSALON) 100  MG capsule, Take 1 capsule (100 mg total) by mouth 2 (two) times daily as needed for cough., Disp: 40 capsule, Rfl: 0   DULoxetine (CYMBALTA) 30 MG capsule, Take 1 capsule (30 mg total) by mouth daily., Disp: 90 capsule, Rfl: 1   fexofenadine (ALLEGRA ALLERGY) 180 MG tablet, Take 1 tablet (180 mg total) by mouth as needed., Disp: 30 tablet, Rfl: 5   Fluticasone-Umeclidin-Vilant 100-62.5-25 MCG/ACT AEPB, Inhale 1 each into the lungs daily at 12 noon., Disp: 60 each, Rfl: 2   hydrocortisone (ANUSOL-HC) 2.5 % rectal cream, Place 1 application rectally 2 (two) times daily., Disp: 30 g, Rfl:  0   hyoscyamine (LEVSIN) 0.125 MG tablet, Take 1 tablet (0.125 mg total) by mouth every 4 (four) hours as needed., Disp: 100 tablet, Rfl: 1   meloxicam (MOBIC) 15 MG tablet, Take 1 tablet (15 mg total) by mouth daily., Disp: 30 tablet, Rfl: 0   montelukast (SINGULAIR) 10 MG tablet, Take 1 tablet (10 mg total) by mouth at bedtime., Disp: 90 tablet, Rfl: 1   omeprazole (PRILOSEC) 40 MG capsule, TAKE 1 CAPSULE(40 MG) BY MOUTH DAILY, Disp: 90 capsule, Rfl: 0  Allergies  Allergen Reactions   Avocado Hives, Shortness Of Breath and Swelling    and raw fruit and vegetables   Daucus Carota Hives, Shortness Of Breath and Swelling   Other Hives, Shortness Of Breath and Swelling    Pears, avacados, nuts    I personally reviewed active problem list, medication list, allergies, family history, social history, health maintenance with the patient/caregiver today.   ROS  ***  Objective  There were no vitals filed for this visit.  There is no height or weight on file to calculate BMI.  Physical Exam ***  No results found for this or any previous visit (from the past 2160 hour(s)).   PHQ2/9:    01/05/2022    2:33 PM 09/20/2021    2:07 PM 08/22/2021   10:35 AM 08/08/2021    8:43 AM 02/17/2021    8:51 AM  Depression screen PHQ 2/9  Decreased Interest 0 0 0 0 0  Down, Depressed, Hopeless 0 0 0 0 0  PHQ - 2 Score 0 0 0 0 0  Altered sleeping 0 0 0 0 0  Tired, decreased energy 0 0 0 0 0  Change in appetite 0 0 0 0 0  Feeling bad or failure about yourself  0 0 0 0 0  Trouble concentrating 0 0 0 0 0  Moving slowly or fidgety/restless 0 0 0 0 0  Suicidal thoughts 0 0 0 0 0  PHQ-9 Score 0 0 0 0 0  Difficult doing work/chores  Not difficult at all Not difficult at all      phq 9 is {gen pos [id number]}   Fall Risk:    01/05/2022    2:33 PM 09/20/2021    2:07 PM 08/22/2021   10:35 AM 08/08/2021    8:42 AM 02/17/2021    8:50 AM  Fall Risk   Falls in the past year? 0 0 0 0 1  Number  falls in past yr: 0 0 0 0 1  Injury with Fall? 0 0 0 0 0  Risk for fall due to : No Fall Risks   No Fall Risks History of fall(s)  Follow up Falls prevention discussed   Falls prevention discussed Falls evaluation completed      Functional Status Survey:      Assessment & Plan  ***  There are no diagnoses linked to this encounter.

## 2022-04-17 ENCOUNTER — Ambulatory Visit: Payer: 59 | Admitting: Family Medicine

## 2022-04-20 NOTE — Progress Notes (Unsigned)
Name: Helen Green   MRN: [id number]    DOB: August 17, 1966   Date:04/23/2022       Progress Note  Subjective  Chief Complaint  Follow up   HPI  Hot flashes: she reached menopause about 10 years ago, she started on duloxetine Feb 2021 and is doing well now, hot flashes not as intense. Unchanged    GERD: she was off Dexilant but symptoms started to get worse again, with bloating and burning sensation and regurgitation. Evaluated by Dr. Marius Ditch She is currently taking Omeprazole and seems to help with symptoms. She resumed caffeine and noticed worsening of symptoms when not compliant with her diet   IBS with diarrhea/constipation: doing better now, she takes Levsin prn Unchanged   Left outer hip pain: she states she has noticed pain on left lateral hip when she gets out of her car that goes away when she walks. She also has noticed pain on the bottom of right foot that radiates up to her leg when standing for a prolonged period of time   Hand pains: states symptoms started with pain on left medial arm but progressed to pain on both hands  feels aching all the time, difficulty getting comfortable at night , mild puffiness in the mornings and feels stiff and sometimes numb. She has intermittent redness and swelling of fingers.  She tried Tylenol. Pain on hands is constant and aching like. We ordered labs on her last visit - inflammatory markers and ANA with reflex panel but she forgot to have it done, we will check it today   RAD: she used to have a cough that would last after an URI, she states she has been having recurrent coughing spells since she had COVID-19 , dry cough, she forgets to take singulair. I gave her Trelegy , only using prn, she states she used initially for one month and symptoms resolved, but now that she takes it prn symptoms are back   Anxiety: she was seen Feb 2021. 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 ENDOSCOPY;  Service: Gastroenterology;  Laterality: N/A;   ESOPHAGOGASTRODUODENOSCOPY (EGD) WITH PROPOFOL N/A 09/27/2017   Procedure: ESOPHAGOGASTRODUODENOSCOPY (EGD) WITH PROPOFOL;  Surgeon: Jonathon Bellows, MD;  Location: [facility] ENDOSCOPY;  Service: Gastroenterology;  Laterality: N/A;   TUBAL LIGATION      Family History  Problem Relation Age of Onset   Diabetes Mother    Hyperlipidemia Mother    Hypertension Mother    Liver disease Father    Cancer Brother 40       brain cancer   Breast cancer Neg Hx     Social History   Tobacco Use   Smoking status: Never  Smokeless tobacco: Never  Substance Use Topics   Alcohol use: No    Alcohol/week: 0.0 standard drinks of alcohol     Current Outpatient Medications:    albuterol (VENTOLIN HFA) 108 (90 Base) MCG/ACT inhaler, Inhale 2 puffs into the lungs every 6 (six) hours as needed for wheezing or shortness of breath., Disp: 18 g, Rfl: 8   DULoxetine (CYMBALTA) 30 MG capsule, Take 1 capsule (30 mg total) by mouth daily., Disp: 90 capsule, Rfl: 1   fexofenadine (ALLEGRA ALLERGY) 180 MG tablet, Take 1 tablet (180 mg total) by mouth as needed., Disp: 30 tablet, Rfl: 5    Fluticasone-Umeclidin-Vilant 100-62.5-25 MCG/ACT AEPB, Inhale 1 each into the lungs daily at 12 noon., Disp: 60 each, Rfl: 2   hydrocortisone (ANUSOL-HC) 2.5 % rectal cream, Place 1 application rectally 2 (two) times daily., Disp: 30 g, Rfl: 0   hyoscyamine (LEVSIN) 0.125 MG tablet, Take 1 tablet (0.125 mg total) by mouth every 4 (four) hours as needed., Disp: 100 tablet, Rfl: 1   meloxicam (MOBIC) 15 MG tablet, Take 1 tablet (15 mg total) by mouth daily., Disp: 30 tablet, Rfl: 0   montelukast (SINGULAIR) 10 MG tablet, Take 1 tablet (10 mg total) by mouth at bedtime., Disp: 90 tablet, Rfl: 1   omeprazole (PRILOSEC) 40 MG capsule, TAKE 1 CAPSULE(40 MG) BY MOUTH DAILY, Disp: 90 capsule, Rfl: 0  Allergies  Allergen Reactions   Avocado Hives, Shortness Of Breath and Swelling    and raw fruit and vegetables   Daucus Carota Hives, Shortness Of Breath and Swelling   Other Hives, Shortness Of Breath and Swelling    Pears, avacados, nuts    I personally reviewed active problem list, medication list, allergies, family history, social history, health maintenance with the patient/caregiver today.   ROS  Constitutional: Negative for fever or weight change.  Respiratory: Negative for cough and shortness of breath.   Cardiovascular: Negative for chest pain or palpitations.  Gastrointestinal: Negative for abdominal pain, no bowel changes.  Musculoskeletal: Negative for gait problem or joint swelling. She has aches and pain  Skin: Negative for rash.  Neurological: Negative for dizziness or headache.  No other specific complaints in a complete review of systems (except as listed in HPI above).   Objective  Vitals:   04/23/22 0823  BP: 126/70  Pulse: 81  Resp: 16  SpO2: 96%  Weight: 146 lb (66.2 kg)  Height: '5\' 3"'$  (1.6 m)    Body mass index is 25.86 kg/m.  Physical Exam  Constitutional: Patient appears well-developed and well-nourished.No distress.  HEENT: head atraumatic, normocephalic,  pupils equal and reactive to light, neck supple Cardiovascular: Normal rate, regular rhythm and normal heart sounds.  No murmur heard. No BLE edema. Pulmonary/Chest: Effort normal and breath sounds normal. No respiratory distress. Abdominal: Soft.  There is no tenderness. Psychiatric: Patient has a normal mood and affect. behavior is normal. Judgment and thought content normal.  Muscular Skeletal: no synovitis of hands, pain during exam of area over lateral aspect of elbow    PHQ2/9:    04/23/2022    8:23 AM 01/05/2022    2:33 PM 09/20/2021    2:07 PM 08/22/2021   10:35 AM 08/08/2021    8:43 AM  Depression screen PHQ 2/9  Decreased Interest 0 0 0 0 0  Down, Depressed, Hopeless 0 0 0 0 0  PHQ - 2 Score 0 0 0 0 0  Altered sleeping 0 0 0 0 0  Tired, decreased energy  0 0 0 0 0  Change in appetite 0 0 0 0 0  Feeling bad or failure about yourself  0 0 0 0 0  Trouble concentrating 0 0 0 0 0  Moving slowly or fidgety/restless 0 0 0 0 0  Suicidal thoughts 0 0 0 0 0  PHQ-9 Score 0 0 0 0 0  Difficult doing work/chores   Not difficult at all Not difficult at all     phq 9 is negative   Fall Risk:    04/23/2022    8:23 AM 01/05/2022    2:33 PM 09/20/2021    2:07 PM 08/22/2021   10:35 AM 08/08/2021    8:42 AM  Fall Risk   Falls in the past year? 0 0 0 0 0  Number falls in past yr: 0 0 0 0 0  Injury with Fall? 0 0 0 0 0  Risk for fall due to : No Fall Risks No Fall Risks   No Fall Risks  Follow up Falls prevention discussed Falls prevention discussed   Falls prevention discussed      Functional Status Survey: Is the patient deaf or have difficulty hearing?: No Does the patient have difficulty seeing, even when wearing glasses/contacts?: No Does the patient have difficulty concentrating, remembering, or making decisions?: No Does the patient have difficulty walking or climbing stairs?: No Does the patient have difficulty dressing or bathing?: No Does the patient have difficulty doing  errands alone such as visiting a doctor's office or shopping?: No    Assessment & Plan  1. Anxiety  - DULoxetine (CYMBALTA) 60 MG capsule; Take 1 capsule (60 mg total) by mouth daily.  Dispense: 90 capsule; Refill: 0  2. Hot flashes due to menopause  - DULoxetine (CYMBALTA) 60 MG capsule; Take 1 capsule (60 mg total) by mouth daily.  Dispense: 90 capsule; Refill: 0  3. Mild intermittent reactive airway disease with acute exacerbation  She will resume Trelegy since she has it at home  4. Arthralgia of both hands  - DULoxetine (CYMBALTA) 60 MG capsule; Take 1 capsule (60 mg total) by mouth daily.  Dispense: 90 capsule; Refill: 0  5. Irritable bowel syndrome with constipation  stable  6. Gastro-esophageal reflux disease without esophagitis  Continue PPI and resume a GERD appropriate diet   7. Medial epicondylitis of left elbow   8. Paresthesia   Increase duloxetine to 60 mg and depending on lab results send to Rheumatologist or Ortho

## 2022-04-23 ENCOUNTER — Encounter: Payer: Self-pay | Admitting: Family Medicine

## 2022-04-23 ENCOUNTER — Ambulatory Visit: Payer: 59 | Admitting: Family Medicine

## 2022-04-23 VITALS — BP 126/70 | HR 81 | Resp 16 | Ht 63.0 in | Wt 146.0 lb

## 2022-04-23 DIAGNOSIS — K219 Gastro-esophageal reflux disease without esophagitis: Secondary | ICD-10-CM

## 2022-04-23 DIAGNOSIS — M25542 Pain in joints of left hand: Secondary | ICD-10-CM

## 2022-04-23 DIAGNOSIS — K581 Irritable bowel syndrome with constipation: Secondary | ICD-10-CM

## 2022-04-23 DIAGNOSIS — M7702 Medial epicondylitis, left elbow: Secondary | ICD-10-CM

## 2022-04-23 DIAGNOSIS — R202 Paresthesia of skin: Secondary | ICD-10-CM

## 2022-04-23 DIAGNOSIS — J4521 Mild intermittent asthma with (acute) exacerbation: Secondary | ICD-10-CM

## 2022-04-23 DIAGNOSIS — F419 Anxiety disorder, unspecified: Secondary | ICD-10-CM | POA: Diagnosis not present

## 2022-04-23 DIAGNOSIS — N951 Menopausal and female climacteric states: Secondary | ICD-10-CM

## 2022-04-23 DIAGNOSIS — M25541 Pain in joints of right hand: Secondary | ICD-10-CM

## 2022-04-23 MED ORDER — DULOXETINE HCL 60 MG PO CPEP: 60.0000 mg | ORAL_CAPSULE | Freq: Every day | ORAL | 0 refills | Status: DC

## 2022-04-27 LAB — SEDIMENTATION RATE: Sed Rate: 2 mm/h (ref 0–30)

## 2022-04-27 LAB — C-REACTIVE PROTEIN: CRP: 3.1 mg/L (ref <8.0)

## 2022-04-27 LAB — ANA,IFA RA DIAG PNL W/RFLX TIT/PATN
Anti Nuclear Antibody (ANA): NEGATIVE
Cyclic Citrullin Peptide Ab: <16 UNITS
Rheumatoid fact SerPl-aCnc: <14 IU/mL (ref <14)

## 2022-07-23 NOTE — Progress Notes (Signed)
Name: Helen Green   MRN: [id number]    DOB: 01-Aug-1966   Date:07/24/2022       Progress Note  Subjective  Chief Complaint  Follow Up  HPI  Hot flashes: she reached menopause about 10 years ago, she started on duloxetine Feb 2021 and was  doing well on 30 mg but the 60 mg dose was too high so she stopped taking and hot flashes are worse   GERD: she was off Dexilant but symptoms started to get worse again, with bloating and burning sensation and regurgitation. Evaluated by Dr. Marius Ditch She went back on Omeprazole but ran out and symptoms are severe, she will see GI soon, she is following a GERD appropriate diet and back on H2blocker, she states the pain goes from stomach to throat and the burning goes to her head. Advised to resume PPI  IBS with diarrhea/constipation: she has an upcoming appointment with her gastroenterologist for GERD. IBS still goes from loose stools to constipation but no recent episodes of lower abdominal pain, she takes Levsin prn    Lateral epicondylitis: she continues to have soreness on her hands and fingers but now the pain is also on lateral epicondyle on both arms, she works typing , pain worse when picking up a pot and grocerie bags causes significant pain   Hand pains: likely OA, evaluation for inflammatory arthritis negative and no swelling or increase in warmth noticed  RAD: she used to have a cough that would last after an URI, but more persistent since COVID in 2021 , she has an intermittent dry cough about once a week,  she forgets to take singulair. I gave her Trelegy , only using prn, she states she used initially for one month and symptoms resolved, but now that she takes it prn and seems to be stable.   Anxiety: she was seen Feb 2021. 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 She is taking Duloxetine 30 mg and was doing well, we tried going up on dose to 60 mg she could not tolerate it, she felt loopy but even without medication her symptoms are  controlled at this time.   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   Vitamin D deficiency 05/03/2015   Irritable bowel syndrome with both constipation and diarrhea 05/03/2015    Past Surgical History:  Procedure Laterality Date   ABDOMINAL HYSTERECTOMY     COLONOSCOPY WITH PROPOFOL N/A 09/27/2017   Procedure: COLONOSCOPY WITH PROPOFOL;  Surgeon: Jonathon Bellows, MD;  Location: [facility] ENDOSCOPY;  Service: Gastroenterology;  Laterality: N/A;   ESOPHAGOGASTRODUODENOSCOPY (EGD) WITH PROPOFOL N/A 09/27/2017   Procedure: ESOPHAGOGASTRODUODENOSCOPY (EGD) WITH PROPOFOL;  Surgeon: Jonathon Bellows, MD;  Location: [facility] ENDOSCOPY;  Service: Gastroenterology;  Laterality: N/A;   TUBAL LIGATION      Family History  Problem Relation Age of Onset   Diabetes Mother    Hyperlipidemia Mother    Hypertension Mother    Liver disease Father    Cancer Brother 35       brain cancer   Breast cancer Neg Hx  Social History   Tobacco Use   Smoking status: Never   Smokeless tobacco: Never  Substance Use Topics   Alcohol use: No    Alcohol/week: 0.0 standard drinks of alcohol     Current Outpatient Medications:    albuterol (VENTOLIN HFA) 108 (90 Base) MCG/ACT inhaler, Inhale 2 puffs into the lungs every 6 (six) hours as needed for wheezing or shortness of breath., Disp: 18 g, Rfl: 8   fexofenadine (ALLEGRA ALLERGY) 180 MG tablet, Take 1 tablet (180 mg total) by mouth as needed., Disp: 30 tablet, Rfl: 5   Fluticasone-Umeclidin-Vilant 100-62.5-25  MCG/ACT AEPB, Inhale 1 each into the lungs daily at 12 noon., Disp: 60 each, Rfl: 2   hyoscyamine (LEVSIN) 0.125 MG tablet, Take 1 tablet (0.125 mg total) by mouth every 4 (four) hours as needed., Disp: 100 tablet, Rfl: 1   meloxicam (MOBIC) 15 MG tablet, Take 1 tablet (15 mg total) by mouth daily., Disp: 30 tablet, Rfl: 0   DULoxetine (CYMBALTA) 60 MG capsule, Take 1 capsule (60 mg total) by mouth daily. (Patient not taking: Reported on 07/24/2022), Disp: 90 capsule, Rfl: 0   hydrocortisone (ANUSOL-HC) 2.5 % rectal cream, Place 1 application rectally 2 (two) times daily. (Patient not taking: Reported on 07/24/2022), Disp: 30 g, Rfl: 0   montelukast (SINGULAIR) 10 MG tablet, Take 1 tablet (10 mg total) by mouth at bedtime. (Patient not taking: Reported on 07/24/2022), Disp: 90 tablet, Rfl: 1   omeprazole (PRILOSEC) 40 MG capsule, TAKE 1 CAPSULE(40 MG) BY MOUTH DAILY (Patient not taking: Reported on 07/24/2022), Disp: 90 capsule, Rfl: 0  Allergies  Allergen Reactions   Avocado Hives, Shortness Of Breath and Swelling    and raw fruit and vegetables   Daucus Carota Hives, Shortness Of Breath and Swelling   Other Hives, Shortness Of Breath and Swelling    Pears, avacados, nuts    I personally reviewed active problem list, medication list, allergies, family history, social history, health maintenance with the patient/caregiver today.   ROS  Constitutional: Negative for fever or weight change.  Respiratory: positive  for intermittent cough but no  shortness of breath.   Cardiovascular: Negative for chest pain or palpitations.  Gastrointestinal: positive  for abdominal pain, no bowel changes.  Musculoskeletal: Negative for gait problem or joint swelling.  Skin: Negative for rash.  Neurological: Negative for dizziness or headache.  No other specific complaints in a complete review of systems (except as listed in HPI above).   Objective  Vitals:   07/24/22 0810  BP: 122/76  Pulse: 84  Resp:  16  SpO2: 98%  Weight: 147 lb (66.7 kg)  Height: '5\' 3"'$  (1.6 m)    Body mass index is 26.04 kg/m.  Physical Exam  Constitutional: Patient appears well-developed and well-nourished.  No distress.  HEENT: head atraumatic, normocephalic, pupils equal and reactive to light, neck supple Cardiovascular: Normal rate, regular rhythm and normal heart sounds.  No murmur heard. No BLE edema. Pulmonary/Chest: Effort normal and breath sounds normal. No respiratory distress. Abdominal: Soft.  There is no tenderness. Psychiatric: Patient has a normal mood and affect. behavior is normal. Judgment and thought content normal.    PHQ2/9:    07/24/2022    8:09 AM 04/23/2022    8:23 AM 01/05/2022    2:33 PM 09/20/2021    2:07 PM 08/22/2021   10:35 AM  Depression screen PHQ 2/9  Decreased Interest 0 0 0 0 0  Down, Depressed, Hopeless 0 0 0 0 0  PHQ - 2 Score 0 0 0 0 0  Altered sleeping 0 0 0 0 0  Tired, decreased energy 0 0 0 0 0  Change in appetite 0 0 0 0 0  Feeling bad or failure about yourself  0 0 0 0 0  Trouble concentrating 0 0 0 0 0  Moving slowly or fidgety/restless 0 0 0 0 0  Suicidal thoughts 0 0 0 0 0  PHQ-9 Score 0 0 0 0 0  Difficult doing work/chores    Not difficult at all Not difficult at all    phq 9 is negative   Fall Risk:    07/24/2022    8:09 AM 04/23/2022    8:23 AM 01/05/2022    2:33 PM 09/20/2021    2:07 PM 08/22/2021   10:35 AM  Fall Risk   Falls in the past year? 1 0 0 0 0  Number falls in past yr: 0 0 0 0 0  Injury with Fall? 1 0 0 0 0  Risk for fall due to : No Fall Risks No Fall Risks No Fall Risks    Follow up Falls prevention discussed Falls prevention discussed Falls prevention discussed        Functional Status Survey: Is the patient deaf or have difficulty hearing?: No Does the patient have difficulty seeing, even when wearing glasses/contacts?: No Does the patient have difficulty concentrating, remembering, or making decisions?: No Does the patient  have difficulty walking or climbing stairs?: No Does the patient have difficulty dressing or bathing?: No Does the patient have difficulty doing errands alone such as visiting a doctor's office or shopping?: No    Assessment & Plan  1. Mild intermittent reactive airway disease without complication  - albuterol (VENTOLIN HFA) 108 (90 Base) MCG/ACT inhaler; Inhale 2 puffs into the lungs every 6 (six) hours as needed for wheezing or shortness of breath.  Dispense: 18 g; Refill: 1  2. Hot flashes due to menopause  - DULoxetine (CYMBALTA) 30 MG capsule; Take 1 capsule (30 mg total) by mouth daily.  Dispense: 90 capsule; Refill: 1  3. Perennial allergic rhinitis with seasonal variation   4. Vitamin D deficiency  Resume supplementation  5. Irritable bowel syndrome with both constipation and diarrhea  - hyoscyamine (LEVSIN) 0.125 MG tablet; Take 1 tablet (0.125 mg total) by mouth every 4 (four) hours as needed.  Dispense: 100 tablet; Refill: 1  6. Lateral epicondylitis of both elbows  Advised ice, rest and stretching at home, can also try a brace  7. Anxiety  - DULoxetine (CYMBALTA) 30 MG capsule; Take 1 capsule (30 mg total) by mouth daily.  Dispense: 90 capsule; Refill: 1  8. Arthralgia of both hands  - DULoxetine (CYMBALTA) 30 MG capsule; Take 1 capsule (30 mg total) by mouth daily.  Dispense: 90 capsule; Refill: 1

## 2022-07-24 ENCOUNTER — Encounter: Payer: Self-pay | Admitting: Family Medicine

## 2022-07-24 ENCOUNTER — Ambulatory Visit: Payer: 59 | Admitting: Family Medicine

## 2022-07-24 VITALS — BP 122/76 | HR 84 | Resp 16 | Ht 63.0 in | Wt 147.0 lb

## 2022-07-24 DIAGNOSIS — J3089 Other allergic rhinitis: Secondary | ICD-10-CM | POA: Diagnosis not present

## 2022-07-24 DIAGNOSIS — N951 Menopausal and female climacteric states: Secondary | ICD-10-CM

## 2022-07-24 DIAGNOSIS — M7712 Lateral epicondylitis, left elbow: Secondary | ICD-10-CM

## 2022-07-24 DIAGNOSIS — E559 Vitamin D deficiency, unspecified: Secondary | ICD-10-CM

## 2022-07-24 DIAGNOSIS — Z23 Encounter for immunization: Secondary | ICD-10-CM | POA: Diagnosis not present

## 2022-07-24 DIAGNOSIS — M25542 Pain in joints of left hand: Secondary | ICD-10-CM

## 2022-07-24 DIAGNOSIS — M25541 Pain in joints of right hand: Secondary | ICD-10-CM | POA: Insufficient documentation

## 2022-07-24 DIAGNOSIS — K582 Mixed irritable bowel syndrome: Secondary | ICD-10-CM

## 2022-07-24 DIAGNOSIS — J302 Other seasonal allergic rhinitis: Secondary | ICD-10-CM

## 2022-07-24 DIAGNOSIS — F419 Anxiety disorder, unspecified: Secondary | ICD-10-CM

## 2022-07-24 DIAGNOSIS — J4520 Mild intermittent asthma, uncomplicated: Secondary | ICD-10-CM

## 2022-07-24 DIAGNOSIS — M7711 Lateral epicondylitis, right elbow: Secondary | ICD-10-CM | POA: Insufficient documentation

## 2022-07-24 MED ORDER — ALBUTEROL SULFATE HFA 108 (90 BASE) MCG/ACT IN AERS: 2.0000 | INHALATION_SPRAY | Freq: Four times a day (QID) | RESPIRATORY_TRACT | 1 refills | Status: DC | PRN

## 2022-07-24 MED ORDER — DULOXETINE HCL 30 MG PO CPEP: 30.0000 mg | ORAL_CAPSULE | Freq: Every day | ORAL | 1 refills | Status: DC

## 2022-07-24 MED ORDER — HYOSCYAMINE SULFATE 0.125 MG PO TABS: 0.1250 mg | ORAL_TABLET | ORAL | 1 refills | Status: DC | PRN

## 2022-07-24 NOTE — Patient Instructions (Signed)
Tennis Elbow Rehab Ask your health care provider which exercises are safe for you. Do exercises exactly as told by your health care provider and adjust them as directed. It is normal to feel mild stretching, pulling, tightness, or discomfort as you do these exercises. Stop right away if you feel sudden pain or your pain gets worse. Do not begin these exercises until told by your health care provider. Stretching and range-of-motion exercises These exercises warm up your muscles and joints and improve the movement and flexibility of your elbow. Wrist flexion, assisted  Straighten your left / right elbow in front of you with your palm facing down toward the floor. If told by your health care provider, bend your left / right elbow to a 90-degree angle (right angle) at your side instead of holding it straight. With your other hand, gently push over the back of your left / right hand so your fingers point toward the floor (flexion). Stop when you feel a gentle stretch on the back of your forearm. Hold this position for __________ seconds. Repeat __________ times. Complete this exercise __________ times a day. Wrist extension, assisted  Straighten your left / right elbow in front of you with your palm facing up toward the ceiling. If told by your health care provider, bend your left / right elbow to a 90-degree angle (right angle) at your side instead of holding it straight. With your other hand, gently pull your left / right hand and fingers toward the floor (extension). Stop when you feel a gentle stretch on the palm side of your forearm. Hold this position for __________ seconds. Repeat __________ times. Complete this exercise __________ times a day. Assisted forearm rotation, supination Sit or stand with your elbows at your side. Bend your left / right elbow to a 90-degree angle (right angle). Using your uninjured hand, turn your left / right palm up toward the ceiling (supination) until you feel a  gentle stretch along the inside of your forearm. Hold this position for __________ seconds. Repeat __________ times. Complete this exercise __________ times a day. Assisted forearm rotation, pronation Sit or stand with your elbows at your side. Bend your left / right elbow to a 90-degree angle (right angle). Using your uninjured hand, turn your left / right palm down toward the floor (pronation) until you feel a gentle stretch along the outside of your forearm. Hold this position for __________ seconds. Repeat __________ times. Complete this exercise __________ times a day. Strengthening exercises These exercises build strength and endurance in your forearm and elbow. Endurance is the ability to use your muscles for a long time, even after they get tired. Radial deviation  Stand with a __________ weight or a hammer in your left / right hand. Or, sit while holding a rubber exercise band or tubing, with your left / right forearm supported on a table or countertop. Position your forearm so that the thumb is facing the ceiling, as if you are going to clap your hands. This is the neutral position. Raise your hand upward in front of you so your thumb moves toward the ceiling (radial deviation), or pull up on the rubber tubing. Keep your forearm and elbow still while you move your wrist only. Hold this position for __________ seconds. Slowly return to the starting position. Repeat __________ times. Complete this exercise __________ times a day. Wrist extension, eccentric Sit with your left / right forearm palm-down and supported on a table or other surface. Let your left /   right wrist extend over the edge of the surface. Hold a __________ weight or a piece of exercise band or tubing in your left / right hand. If using a rubber exercise band or tubing, hold the other end of the tubing with your other hand. Use your uninjured hand to move your left / right hand up toward the ceiling. Take your  uninjured hand away and slowly return to the starting position using only your left / right hand. Lowering your arm under tension is called eccentric extension. Repeat __________ times. Complete this exercise __________ times a day. Wrist extension Do not do this exercise if it causes pain at the outside of your elbow. Only do this exercise once instructed by your health care provider. Sit with your left / right forearm supported on a table or other surface and your palm turned down toward the floor. Let your left / right wrist extend over the edge of the surface. Hold a __________ weight or a piece of rubber exercise band or tubing. If you are using a rubber exercise band or tubing, hold the band or tubing in place with your other hand to provide resistance. Slowly bend your wrist so your hand moves up toward the ceiling (extension). Move only your wrist, keeping your forearm and elbow still. Hold this position for __________ seconds. Slowly return to the starting position. Repeat __________ times. Complete this exercise __________ times a day. Forearm rotation, supination To do this exercise, you will need a lightweight hammer or rubber mallet. Sit with your left / right forearm supported on a table or other surface. Bend your elbow to a 90-degree angle (right angle). Position your forearm so that your palm is facing down toward the floor, with your hand resting over the edge of the table. Hold a hammer in your left / right hand. To make this exercise easier, hold the hammer near the head of the hammer. To make this exercise harder, hold the hammer near the end of the handle. Without moving your wrist or elbow, slowly rotate your forearm so your palm faces up toward the ceiling (supination). Hold this position for __________ seconds. Slowly return to the starting position. Repeat __________ times. Complete this exercise __________ times a day. Shoulder blade squeeze Sit in a stable chair or  stand with good posture. If you are sitting down, do not let your back touch the back of the chair. Your arms should be at your sides with your elbows bent to a 90-degree angle (right angle). Position your forearms so that your thumbs are facing the ceiling (neutral position). Without lifting your shoulders up, squeeze your shoulder blades tightly together. Hold this position for __________ seconds. Slowly release and return to the starting position. Repeat __________ times. Complete this exercise __________ times a day. This information is not intended to replace advice given to you by your health care provider. Make sure you discuss any questions you have with your health care provider. Document Revised: 12/30/2019 Document Reviewed: 12/30/2019 Elsevier Patient Education  [place]  Tennis elbow (lateral epicondylitis) is inflammation of tendons in your outer forearm, near your elbow. Tendons are tissues that connect muscle to bone. When you have tennis elbow, inflammation affects the tendons that you use to bend your wrist and move your hand up. Inflammation occurs in the lower part of the upper arm bone (humerus), where the tendons connect to the bone (lateral epicondyle). Tennis elbow often affects people who play tennis, but anyone  may get the condition from repeatedly extending the wrist or turning the forearm. What are the causes? This condition is usually caused by repeatedly extending the wrist, turning the forearm, and using the hands. It can result from sports or work that requires repetitive forearm movements. In some cases, it may be caused by a sudden injury. What increases the risk? You are more likely to develop tennis elbow if you play tennis or another racket sport. You also have a higher risk if you frequently use your hands for work. Besides people who play tennis, others at greater risk include: People who use computers. Architect workers. People who  work in [employer]. Musicians. Cooks. Cashiers. What are the signs or symptoms? Symptoms of this condition include: Pain and tenderness in the forearm and the outer part of the elbow. Pain may be felt only when using the arm, or it may be there all the time. A burning feeling that starts in the elbow and spreads down the forearm. A weak grip in the hand. How is this diagnosed? This condition is diagnosed based on your symptoms, your medical history, and a physical exam. You may also have X-rays or an MRI to: Confirm the diagnosis. Look for other issues. Check for tears in the ligaments, muscles, or tendons. How is this treated? Resting and icing your arm is often the first treatment. Your health care provider may also recommend: Medicines to reduce pain and inflammation. These may be in the form of a pill, topical gels, or shots of a steroid medicine (cortisone). An elbow strap to reduce stress on the area. Physical therapy. This may include massage or exercises or both. An elbow brace to restrict the movements that cause symptoms. If these treatments do not help relieve your symptoms, your health care provider may recommend surgery to remove damaged muscle and reattach healthy muscle to bone. Follow these instructions at home: If you have a brace or strap: Wear the brace or strap as told by your health care provider. Remove it only as told by your health care provider. Check the skin around the brace or strap every day. Tell your health care provider about any concerns. Loosen the brace if your fingers tingle, become numb, or turn cold and blue. Keep the brace clean. If the brace or strap is not waterproof: Do not let it get wet. Cover it with a watertight covering when you take a bath or a shower. Managing pain, stiffness, and swelling  If directed, put ice on the injured area. To do this: If you have a removable brace or strap, remove it as told by your health care provider. Put  ice in a plastic bag. Place a towel between your skin and the bag. Leave the ice on for 20 minutes, 2-3 times a day. Remove the ice if your skin turns bright red. This is very important. If you cannot feel pain, heat, or cold, you have a greater risk of damage to the area. Move your fingers often to reduce stiffness and swelling. Activity Rest your elbow and wrist and avoid activities that cause symptoms as told by your health care provider. Do physical therapy exercises as told by your health care provider. If you lift an object, lift it with your palm facing up. This reduces stress on your elbow. Lifestyle If your tennis elbow is caused by sports, check your equipment and make sure that: You use it correctly. It is good match for you. If your tennis elbow is caused by  work or computer use, take frequent breaks to stretch your arm. Talk with your employer about ways to manage your condition at work. General instructions Take over-the-counter and prescription medicines only as told by your health care provider. Do not use any products that contain nicotine or tobacco. These products include cigarettes, chewing tobacco, and vaping devices, such as e-cigarettes. If you need help quitting, ask your health care provider. Keep all follow-up visits. This is important. How is this prevented? Before and after activity: Warm up and stretch before being active. Cool down and stretch after being active. Give your body time to rest between periods of activity. During activity: Make sure to use equipment that fits you. If you play tennis, put power in your stroke with your lower body. Avoid using your arm only. Maintain physical fitness, including: Strength. Flexibility. Endurance. Do exercises to strengthen the forearm muscles. Contact a health care provider if: You have pain that gets worse or does not get better with treatment. You have numbness or weakness in your forearm, hand, or  fingers. Get help right away if: Your pain is severe. You cannot move your wrist. Summary Tennis elbow (lateral epicondylitis) is inflammation of tendons in your outer forearm, near your elbow. Common symptoms include pain and tenderness in your forearm and the outer part of your elbow. This condition is usually caused by repeatedly extending your wrist, turning your forearm, and using your hands. The first treatment is often resting and icing your arm to relieve symptoms. Further treatment may include taking medicine, getting physical therapy, wearing a brace or strap, or having surgery. This information is not intended to replace advice given to you by your health care provider. Make sure you discuss any questions you have with your health care provider. Document Revised: 04/19/2020 Document Reviewed: 04/19/2020 Elsevier Patient Education  [place].

## 2022-08-01 ENCOUNTER — Encounter: Payer: Self-pay | Admitting: Gastroenterology

## 2022-08-01 ENCOUNTER — Other Ambulatory Visit: Payer: Self-pay

## 2022-08-01 ENCOUNTER — Ambulatory Visit: Payer: 59 | Admitting: Gastroenterology

## 2022-08-01 VITALS — BP 126/75 | HR 79 | Temp 98.1°F | Ht 63.0 in | Wt 147.1 lb

## 2022-08-01 DIAGNOSIS — Z86010 Personal history of colonic polyps: Secondary | ICD-10-CM

## 2022-08-01 DIAGNOSIS — K219 Gastro-esophageal reflux disease without esophagitis: Secondary | ICD-10-CM

## 2022-08-01 MED ORDER — NA SULFATE-K SULFATE-MG SULF 17.5-3.13-1.6 GM/177ML PO SOLN: 354.0000 mL | Freq: Once | ORAL | 0 refills | Status: AC

## 2022-08-01 MED ORDER — OMEPRAZOLE 40 MG PO CPDR: 40.0000 mg | DELAYED_RELEASE_CAPSULE | Freq: Two times a day (BID) | ORAL | 0 refills | Status: DC

## 2022-08-01 NOTE — Progress Notes (Signed)
Cephas Darby, MD [address]  [place]  [place], [place] [address]  Main: [phone]  Fax: [phone] Pager: [phone]   Primary Care Physician: Steele Sizer, MD  Primary Gastroenterologist:  Dr. Cephas Darby  Chief Complaint  Patient presents with   Gastroesophageal Reflux    HPI: Helen Green is a 56 y.o. female with history of constipation, functional dyspepsia, reflux, abdominal bloating.  She underwent extensive work-up thus far including bidirectional endoscopy, cross-sectional imaging which were unremarkable.  H. pylori breath test negative, TTG IgA negative.  Patient contacted my office last week of August secondary to severe heartburn, abdominal discomfort, severe bloating as well as rectal bleeding.  I have suggested her to try Dexilant 60 mg daily and also Anusol cream.  She reports that rectal bleeding subsided.  She continues to have severe abdominal bloating and burps frequently whenever she massages her body.  She also reports severe burning in her upper stomach as well as throat.  She is taking Bentyl as needed which provides temporary relief only.  She thinks Dexilant or Prilosec are not helping much.  She is also generally stressed out with her job, it involves frequent travel and also is involved in church activities.  Patient underwent ligation of internal hemorrhoids.  She developed severe pain after second ligation.  She chose not to undergo third ligation.  Follow-up visit 01/30/21 Patient is here for follow-up of her functional GI symptoms.  She has been treated for abdominal bloating with a 2 weeks course of metronidazole as rifaximin has been denied by her insurance.  She reports that she had flareup of severe heartburn as well as regurgitation.  Dexilant led to severe constipation.  Therefore, she is taking over-the-counter Nexium once a day at bedtime which is providing modest relief.  She does acknowledge drinking sweet tea with each  meal as well as craving for sweets.  Follow-up visit 08/01/2022 Patient is here for follow-up of recent flareup of severe GERD symptoms.  She reports that for last 3 weeks or so she has been experiencing severe heartburn, burning in her throat radiating to her head and ears associated with mild epigastric discomfort.  She does not have omeprazole.  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  She has also not been eating right foods.  Current Outpatient Medications:    albuterol (VENTOLIN HFA) 108 (90 Base) MCG/ACT inhaler, Inhale 2 puffs into the lungs every 6 (six) hours as needed for wheezing or shortness of breath., Disp: 18 g, Rfl: 1   DULoxetine (CYMBALTA) 30 MG capsule, Take 1 capsule (30 mg total) by mouth daily., Disp: 90 capsule, Rfl: 1   fexofenadine (ALLEGRA ALLERGY) 180 MG tablet, Take 1 tablet (180 mg total) by mouth as needed., Disp: 30 tablet, Rfl: 5   Fluticasone-Umeclidin-Vilant 100-62.5-25 MCG/ACT AEPB, Inhale 1 each into the lungs daily at 12 noon., Disp: 60 each, Rfl: 2   hyoscyamine (LEVSIN) 0.125 MG tablet, Take 1 tablet (0.125 mg total) by mouth every 4 (four) hours as needed., Disp: 100 tablet, Rfl: 1   meloxicam (MOBIC) 15 MG tablet, Take 1 tablet (15 mg total) by mouth daily., Disp: 30 tablet, Rfl: 0   Na Sulfate-K Sulfate-Mg Sulf 17.5-3.13-1.6 GM/177ML SOLN, Take 354 mLs by mouth once for 1 dose., Disp: 354 mL, Rfl: 0   omeprazole (PRILOSEC) 40 MG capsule, Take 1 capsule (40 mg  total) by mouth 2 (two) times daily before a meal., Disp: 180 capsule, Rfl: 0   Allergies as of 08/01/2022 - Review Complete 08/01/2022  Allergen Reaction Noted   Avocado Hives, Shortness Of Breath, and Swelling 05/03/2015   Daucus carota Hives, Shortness Of Breath, and Swelling 07/30/2014   Other Hives, Shortness Of Breath, and Swelling 05/03/2015    NSAIDs: none  Antiplts/Anticoagulants/Anti thrombotics:  none  GI procedures:  EGD and colonoscopy by Dr. Vicente Males 09/27/2017 - Normal examined duodenum. - Normal esophagus. - Normal stomach. Biopsied.  - One 3 mm polyp in the transverse colon, removed with a cold biopsy forceps. Resected and retrieved. - The examination was otherwise normal on direct and retroflexion views.  DIAGNOSIS:  A.  STOMACH; COLD BIOPSY:  - ANTRAL MUCOSA WITH MILD CHRONIC GASTRITIS.  - NEGATIVE FOR H. PYLORI, DYSPLASIA AND MALIGNANCY.   B.  COLON POLYP, TRANSVERSE; COLD BIOPSY:  - TUBULAR ADENOMA.  - NEGATIVE FOR HIGH-GRADE DYSPLASIA AND MALIGNANCY.   ROS:  General: Negative for anorexia, weight loss, fever, chills, fatigue, weakness. ENT: Negative for hoarseness, difficulty swallowing , nasal congestion. CV: Negative for chest pain, angina, palpitations, dyspnea on exertion, peripheral edema.  Respiratory: Negative for dyspnea at rest, dyspnea on exertion, cough, sputum, wheezing.  GI: See history of present illness. GU:  Negative for dysuria, hematuria, urinary incontinence, urinary frequency, nocturnal urination.  Endo: Negative for unusual weight change.    Physical Examination:   BP 126/75 (BP Location: Left Arm, Patient Position: Sitting, Cuff Size: Normal)   Pulse 79   Temp 98.1 F (36.7 C) (Oral)   Ht '5\' 3"'$  (1.6 m)   Wt 147 lb 2 oz (66.7 kg)   BMI 26.06 kg/m   General: Well-nourished, well-developed in no acute distress.  Eyes: No icterus. Conjunctivae pink. Mouth: Oropharyngeal mucosa moist and pink , no lesions erythema or exudate. Lungs: Clear to auscultation bilaterally. Non-labored. Heart: Regular rate and rhythm, no murmurs rubs or gallops.  Abdomen: Bowel sounds are normal, nontender, moderately distended, tympanic to percussion, no hepatosplenomegaly or masses, no hernia , no rebound or guarding.   Rectum: Mild tenderness in the posterior anal canal, moderate tenderness in the anterior anal canal consistent with anal  fissure Extremities: No lower extremity edema. No clubbing or deformities. Neuro: Alert and oriented x 3.  Grossly intact. Skin: Warm and dry, no jaundice.   Psych: Alert and cooperative, normal mood and affect.   Imaging Studies: reviewed  Assessment and Plan:   Helen Green is a 56 y.o. Hispanic female is seen for flareup of GERD   Flareup of chronic GERD Start omeprazole 40 mg p.o. twice daily before meals Continue antireflux lifestyle Continue low-dose duloxetine Recommend EGD with esophageal biopsies, Bravo pH study on high-dose PPI Possibly evaluate for antireflux surgery To be discussed with patient regarding right upper quadrant ultrasound and HIDA scan   Tubular adenoma colon: Surveillance colonoscopy in 09/2022  Follow up in 4 months or sooner if needed  Dr [name], MD

## 2022-08-04 LAB — ALPHA-GAL PANEL
Allergen Lamb IgE: <0.1 kU/L
Beef IgE: <0.1 kU/L
IgE (Immunoglobulin E), Serum: 87 IU/mL (ref 6–495)
O215-IgE Alpha-Gal: <0.1 kU/L
Pork IgE: <0.1 kU/L

## 2022-08-04 LAB — FOOD ALLERGY PROFILE
Allergen Corn, IgE: 0.14 kU/L — AB
Clam IgE: <0.1 kU/L
Codfish IgE: <0.1 kU/L
Egg White IgE: <0.1 kU/L
Milk IgE: <0.1 kU/L
Peanut IgE: 0.69 kU/L — AB
Scallop IgE: <0.1 kU/L
Sesame Seed IgE: <0.1 kU/L
Shrimp IgE: <0.1 kU/L
Soybean IgE: <0.1 kU/L
Walnut IgE: <0.1 kU/L
Wheat IgE: <0.1 kU/L

## 2022-08-06 ENCOUNTER — Telehealth: Payer: Self-pay

## 2022-08-06 NOTE — Telephone Encounter (Signed)
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  Recommend to completely eliminate these 2 foods  RV

## 2022-08-06 NOTE — Telephone Encounter (Signed)
Patient verbalized understanding of results. Patient wants to move colonoscopy to 08/30/2022. Called ENDO and talk to Almyra Free and she will get patient moved

## 2022-08-08 NOTE — Patient Instructions (Signed)
Preventive Care 40-54 Years Old, Female Preventive care refers to lifestyle choices and visits with your health care provider that can promote health and wellness. Preventive care visits are also called wellness exams. What can I expect for my preventive care visit? Counseling Your health care provider may ask you questions about your: Medical history, including: Past medical problems. Family medical history. Pregnancy history. Current health, including: Menstrual cycle. Method of birth control. Emotional well-being. Home life and relationship well-being. Sexual activity and sexual health. Lifestyle, including: Alcohol, nicotine or tobacco, and drug use. Access to firearms. Diet, exercise, and sleep habits. Work and work environment. Sunscreen use. Safety issues such as seatbelt and bike helmet use. Physical exam Your health care provider will check your: Height and weight. These may be used to calculate your BMI (body mass index). BMI is a measurement that tells if you are at a healthy weight. Waist circumference. This measures the distance around your waistline. This measurement also tells if you are at a healthy weight and may help predict your risk of certain diseases, such as type 2 diabetes and high blood pressure. Heart rate and blood pressure. Body temperature. Skin for abnormal spots. What immunizations do I need?  Vaccines are usually given at various ages, according to a schedule. Your health care provider will recommend vaccines for you based on your age, medical history, and lifestyle or other factors, such as travel or where you work. What tests do I need? Screening Your health care provider may recommend screening tests for certain conditions. This may include: Lipid and cholesterol levels. Diabetes screening. This is done by checking your blood sugar (glucose) after you have not eaten for a while (fasting). Pelvic exam and Pap test. Hepatitis B test. Hepatitis C  test. HIV (human immunodeficiency virus) test. STI (sexually transmitted infection) testing, if you are at risk. Lung cancer screening. Colorectal cancer screening. Mammogram. Talk with your health care provider about when you should start having regular mammograms. This may depend on whether you have a family history of breast cancer. BRCA-related cancer screening. This may be done if you have a family history of breast, ovarian, tubal, or peritoneal cancers. Bone density scan. This is done to screen for osteoporosis. Talk with your health care provider about your test results, treatment options, and if necessary, the need for more tests. Follow these instructions at home: Eating and drinking  Eat a diet that includes fresh fruits and vegetables, whole grains, lean protein, and low-fat dairy products. Take vitamin and mineral supplements as recommended by your health care provider. Do not drink alcohol if: Your health care provider tells you not to drink. You are pregnant, may be pregnant, or are planning to become pregnant. If you drink alcohol: Limit how much you have to 0-1 drink a day. Know how much alcohol is in your drink. In the [place] one drink equals one 12 oz bottle of beer (355 mL), one 5 oz glass of wine (148 mL), or one 1 oz glass of hard liquor (44 mL). Lifestyle Brush your teeth every morning and night with fluoride toothpaste. Floss one time each day. Exercise for at least 30 minutes 5 or more days each week. Do not use any products that contain nicotine or tobacco. These products include cigarettes, chewing tobacco, and vaping devices, such as e-cigarettes. If you need help quitting, ask your health care provider. Do not use drugs. If you are sexually active, practice safe sex. Use a condom or other form of protection to   prevent STIs. If you do not wish to become pregnant, use a form of birth control. If you plan to become pregnant, see your health care provider for a  prepregnancy visit. Take aspirin only as told by your health care provider. Make sure that you understand how much to take and what form to take. Work with your health care provider to find out whether it is safe and beneficial for you to take aspirin daily. Find healthy ways to manage stress, such as: Meditation, yoga, or listening to music. Journaling. Talking to a trusted person. Spending time with friends and family. Minimize exposure to UV radiation to reduce your risk of skin cancer. Safety Always wear your seat belt while driving or riding in a vehicle. Do not drive: If you have been drinking alcohol. Do not ride with someone who has been drinking. When you are tired or distracted. While texting. If you have been using any mind-altering substances or drugs. Wear a helmet and other protective equipment during sports activities. If you have firearms in your house, make sure you follow all gun safety procedures. Seek help if you have been physically or sexually abused. What's next? Visit your health care provider once a year for an annual wellness visit. Ask your health care provider how often you should have your eyes and teeth checked. Stay up to date on all vaccines. This information is not intended to replace advice given to you by your health care provider. Make sure you discuss any questions you have with your health care provider. Document Revised: 04/05/2021 Document Reviewed: 04/05/2021 Elsevier Patient Education  [place].

## 2022-08-08 NOTE — Progress Notes (Signed)
Name: Helen Green   MRN: [id number]    DOB: Jan 24, 1966   Date:08/10/2022       Progress Note  Subjective  Chief Complaint  Annual Exam  HPI  Patient presents for annual CPE.  Diet: balanced  Exercise: discussed 150 minutes per week   Last Eye Exam: every year  Last Dental Exam: every 6 months   [facility] Office Visit from 08/08/2021 in [facility]  AUDIT-C Score 0      Depression: Phq 9 is  negative    08/10/2022    8:22 AM 07/24/2022    8:09 AM 04/23/2022    8:23 AM 01/05/2022    2:33 PM 09/20/2021    2:07 PM  Depression screen PHQ 2/9  Decreased Interest 0 0 0 0 0  Down, Depressed, Hopeless 0 0 0 0 0  PHQ - 2 Score 0 0 0 0 0  Altered sleeping 0 0 0 0 0  Tired, decreased energy 0 0 0 0 0  Change in appetite 0 0 0 0 0  Feeling bad or failure about yourself  0 0 0 0 0  Trouble concentrating 0 0 0 0 0  Moving slowly or fidgety/restless 0 0 0 0 0  Suicidal thoughts 0 0 0 0 0  PHQ-9 Score 0 0 0 0 0  Difficult doing work/chores     Not difficult at all   Hypertension: BP Readings from Last 3 Encounters:  08/10/22 122/70  08/01/22 126/75  07/24/22 122/76   Obesity: Wt Readings from Last 3 Encounters:  08/10/22 148 lb (67.1 kg)  08/01/22 147 lb 2 oz (66.7 kg)  07/24/22 147 lb (66.7 kg)   BMI Readings from Last 3 Encounters:  08/10/22 26.22 kg/m  08/01/22 26.06 kg/m  07/24/22 26.04 kg/m     Vaccines:   Tdap: up to date Shingrix: discussed going to local pharmacy  Pneumonia: N/A Flu: up to date COVID-19: up to date   Hep C Screening: 03/24/20 STD testing and prevention (HIV/chl/gon/syphilis): N/A Intimate partner violence: negative screen  Sexual History : no problems  Menstrual History/LMP/Abnormal Bleeding: s/p hysterectomy  Discussed importance of follow up if any post-menopausal bleeding: not applicable  Incontinence Symptoms: negative for symptoms   Breast cancer:  - Last Mammogram: 12/21/20 - BRCA gene  screening: N/A  Osteoporosis Prevention : Discussed high calcium and vitamin D supplementation, weight bearing exercises Bone density:  N/A  Cervical cancer screening: 03/24/20  Skin cancer: Discussed monitoring for atypical lesions  Colorectal cancer: 09/27/17  scheduled for repeat in Nov  Lung cancer:  Low Dose CT Chest recommended if Age 82-80 years, 20 pack-year currently smoking OR have quit w/in 15years. Patient does not qualify for screen   ECG: 12/19/20  Advanced Care Planning: A voluntary discussion about advance care planning including the explanation and discussion of advance directives.  Discussed health care proxy and Living will, and the patient was able to identify a health care proxy as husband .  Patient does not have a living will and power of attorney of health care   Lipids: Lab Results  Component Value Date   CHOL 224 (H) 08/08/2021   CHOL 220 (H) 03/24/2020   CHOL 193 09/26/2012   Lab Results  Component Value Date   HDL 73 08/08/2021   HDL 72 03/24/2020   HDL 81 (A) 09/26/2012   Lab Results  Component Value Date   LDLCALC 129 (H) 08/08/2021   LDLCALC 126 (H) 03/24/2020  LDLCALC 96 09/26/2012   Lab Results  Component Value Date   TRIG 110 08/08/2021   TRIG 109 03/24/2020   TRIG 79 09/26/2012   Lab Results  Component Value Date   CHOLHDL 3.1 08/08/2021   CHOLHDL 3.1 03/24/2020   No results found for: "LDLDIRECT"  Glucose: Glucose, Bld  Date Value Ref Range Status  08/08/2021 90 65 - 99 mg/dL Final    Comment:    .            Fasting reference interval .   12/16/2020 98 70 - 99 mg/dL Final    Comment:    Glucose reference range applies only to samples taken after fasting for at least 8 hours.  03/24/2020 87 65 - 99 mg/dL Final    Comment:    .            Fasting reference interval .    Glucose-Capillary  Date Value Ref Range Status  12/16/2020 71 70 - 99 mg/dL Final    Comment:    Glucose reference range applies only to samples  taken after fasting for at least 8 hours.    Patient Active Problem List   Diagnosis Date Noted   Lateral epicondylitis of both elbows 07/24/2022   Arthralgia of both hands 07/24/2022   Hot flashes due to menopause 01/05/2022   Anxiety 01/05/2022   Reactive airway disease 07/05/2020   Insomnia, persistent 05/03/2015   Functional dyspepsia 05/03/2015   Gastro-esophageal reflux disease without esophagitis 05/03/2015   Perennial allergic rhinitis with seasonal variation 05/03/2015   Arthritis of temporomandibular joint 05/03/2015   Vitamin D deficiency 05/03/2015   Irritable bowel syndrome with both constipation and diarrhea 05/03/2015    Past Surgical History:  Procedure Laterality Date   ABDOMINAL HYSTERECTOMY     COLONOSCOPY WITH PROPOFOL N/A 09/27/2017   Procedure: COLONOSCOPY WITH PROPOFOL;  Surgeon: Jonathon Bellows, MD;  Location: [facility] ENDOSCOPY;  Service: Gastroenterology;  Laterality: N/A;   ESOPHAGOGASTRODUODENOSCOPY (EGD) WITH PROPOFOL N/A 09/27/2017   Procedure: ESOPHAGOGASTRODUODENOSCOPY (EGD) WITH PROPOFOL;  Surgeon: Jonathon Bellows, MD;  Location: [facility] ENDOSCOPY;  Service: Gastroenterology;  Laterality: N/A;   TUBAL LIGATION      Family History  Problem Relation Age of Onset   Diabetes Mother    Hyperlipidemia Mother    Hypertension Mother    Liver disease Father    Cancer Brother 13       brain cancer   Breast cancer Neg Hx     Social History   Socioeconomic History   Marital status: Married    Spouse name: [name]   Number of children: 2   Years of education: Not on file   Highest education level: Associate degree: occupational, Hotel manager, or vocational program  Occupational History   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      Right handed  Two story home   Social Determinants of Health   Financial Resource Strain: Low Risk  (08/10/2022)   Overall Financial Resource Strain (CARDIA)    Difficulty of Paying Living Expenses: Not hard at all  Food Insecurity: No Food Insecurity (08/10/2022)   Hunger Vital Sign    Worried About Running Out of Food in the Last Year: Never true    Ran Out of Food in the Last Year: Never true  Transportation Needs: No Transportation Needs (08/10/2022)   PRAPARE - Hydrologist (Medical): No    Lack of Transportation (Non-Medical): No  Physical Activity: Insufficiently Active (08/10/2022)   Exercise Vital Sign    Days of Exercise per Week: 3 days    Minutes of Exercise per Session: 30 min  Stress: No Stress Concern Present (08/10/2022)   [place]    Feeling of Stress : Not at all  Social Connections: [place] (08/10/2022)   Social Connection and Isolation Panel [NHANES]    Frequency of Communication with Friends and Family: More than three times a week    Frequency of Social Gatherings with Friends and Family: Once a week    Attends Religious Services: More than 4 times per year    Active Member of [employer] or Organizations: Yes    Attends Music therapist: More than 4 times per year    Marital Status: Married  Human resources officer Violence: Not At Risk (08/10/2022)   Humiliation, Afraid, Rape, and Kick questionnaire    Fear of Current or Ex-Partner: No    Emotionally Abused: No    Physically Abused: No    Sexually Abused: No     Current Outpatient Medications:    albuterol  (VENTOLIN HFA) 108 (90 Base) MCG/ACT inhaler, Inhale 2 puffs into the lungs every 6 (six) hours as needed for wheezing or shortness of breath., Disp: 18 g, Rfl: 1   DULoxetine (CYMBALTA) 30 MG capsule, Take 1 capsule (30 mg total) by mouth daily., Disp: 90 capsule, Rfl: 1   fexofenadine (ALLEGRA ALLERGY) 180 MG tablet, Take 1 tablet (180 mg total) by mouth as needed., Disp: 30 tablet, Rfl: 5   Fluticasone-Umeclidin-Vilant 100-62.5-25 MCG/ACT AEPB, Inhale 1 each into the lungs daily at 12 noon., Disp: 60 each, Rfl: 2   hyoscyamine (LEVSIN) 0.125 MG tablet, Take 1 tablet (0.125 mg total) by mouth every 4 (four) hours as needed., Disp: 100 tablet, Rfl: 1   meloxicam (MOBIC) 15 MG tablet, Take 1 tablet (15 mg total) by mouth daily., Disp: 30 tablet, Rfl: 0   omeprazole (PRILOSEC) 40 MG capsule, Take 1 capsule (40 mg total) by mouth 2 (two) times daily before a meal., Disp: 180 capsule, Rfl: 0  Allergies  Allergen Reactions   Avocado Hives, Shortness Of Breath and Swelling    and raw fruit and vegetables   Daucus [place]ota Hives, Shortness Of Breath and Swelling   Other Hives, Shortness Of Breath and Swelling    Pears, avacados, nuts     ROS  Constitutional: Negative for fever or weight change.  Respiratory: Negative for cough and shortness of breath.   Cardiovascular: Negative for chest pain or palpitations.  Gastrointestinal: positive for intermittent abdominal pain, no bowel changes.  Musculoskeletal: Negative for gait problem or joint swelling.  Skin: Negative for rash.  Neurological: Negative for dizziness or headache.  No other specific complaints in a complete review of systems (except as listed in HPI above).  Objective  Vitals:   08/10/22 0823  BP: 122/70  Pulse: 91  Resp: 16  SpO2: 97%  Weight: 148 lb (67.1 kg)  Height: $Remove'5\' 3"'[id number]$  (1.6 m)    Body mass index is 26.22 kg/m.  Physical Exam  Constitutional: Patient appears well-developed and well-nourished. No distress.   HENT: Head: Normocephalic and atraumatic. Ears: B TMs ok, no erythema or effusion; Nose: Nose normal. Mouth/Throat: Oropharynx is clear and moist. No oropharyngeal exudate.  Eyes: Conjunctivae and EOM are normal. Pupils are equal, round, and reactive to light. No scleral icterus.  Neck: Normal range of motion. Neck supple. No JVD present. No thyromegaly present.  Cardiovascular: Normal rate, regular rhythm and normal heart sounds.  No murmur heard. No BLE edema. Pulmonary/Chest: Effort normal and breath sounds normal. No respiratory distress. Abdominal: Soft. Bowel sounds are normal, no distension. There is no tenderness. no masses Breast: no lumps or masses, no nipple discharge or rashes FEMALE GENITALIA:  Not done  RECTAL: not done  Musculoskeletal: Normal range of motion, no joint effusions. No gross deformities Neurological: he is alert and oriented to person, place, and time. No cranial nerve deficit. Coordination, balance, strength, speech and gait are normal.  Skin: Skin is warm and dry. No rash noted. No erythema.  Psychiatric: Patient has a normal mood and affect. behavior is normal. Judgment and thought content normal.   Recent Results (from the past 2160 hour(s))  Food Allergy Profile     Status: Abnormal   Collection Time: 08/01/22  9:57 AM  Result Value Ref Range   Egg White IgE <0.10 Class 0 kU/L   Peanut IgE 0.69 (A) Class II kU/L   Soybean IgE <0.10 Class 0 kU/L   Milk IgE <0.10 Class 0 kU/L   Clam IgE <0.10 Class 0 kU/L   Shrimp IgE <0.10 Class 0 kU/L   Walnut IgE <0.10 Class 0 kU/L   Codfish IgE <0.10 Class 0 kU/L   Scallop IgE <0.10 Class 0 kU/L   Wheat IgE <0.10 Class 0 kU/L   Allergen Corn, IgE 0.14 (A) Class 0/I kU/L   Sesame Seed IgE <0.10 Class 0 kU/L  Alpha-Gal Panel     Status: None   Collection Time: 08/01/22  9:57 AM  Result Value Ref Range   Class Description Allergens Comment     Comment:     Levels of Specific IgE       Class  Description of Class      ---------------------------  -----  --------------------                    < 0.10         0         Negative            0.10 -    0.31         0/I       Equivocal/Low            0.32 -    0.55         I         Low            0.56 -    1.40         II        Moderate            1.41 -    3.90         III  High            3.91 -   19.00         IV        Very High           19.01 -  100.00         V         Very High                   >100.00         VI        Very High    IgE (Immunoglobulin E), Serum 87 6 - 495 IU/mL   O215-IgE Alpha-Gal <0.10 Class 0 kU/L   Beef IgE <0.10 Class 0 kU/L   Pork IgE <0.10 Class 0 kU/L   Allergen Lamb IgE <0.10 Class 0 kU/L     Fall Risk:    08/10/2022    8:22 AM 07/24/2022    8:09 AM 04/23/2022    8:23 AM 01/05/2022    2:33 PM 09/20/2021    2:07 PM  Fall Risk   Falls in the past year? 1 1 0 0 0  Number falls in past yr: 0 0 0 0 0  Injury with Fall? 1 1 0 0 0  Risk for fall due to : No Fall Risks No Fall Risks No Fall Risks No Fall Risks   Follow up Falls prevention discussed Falls prevention discussed Falls prevention discussed Falls prevention discussed      Functional Status Survey: Is the patient deaf or have difficulty hearing?: No Does the patient have difficulty seeing, even when wearing glasses/contacts?: No Does the patient have difficulty concentrating, remembering, or making decisions?: No Does the patient have difficulty walking or climbing stairs?: No Does the patient have difficulty dressing or bathing?: No Does the patient have difficulty doing errands alone such as visiting a doctor's office or shopping?: No   Assessment & Plan  1. Well adult exam  - MM 3D SCREEN BREAST BILATERAL; Future - Lipid panel - CBC with Differential/Platelet - COMPLETE METABOLIC PANEL WITH GFR - VITAMIN D 25 Hydroxy (Vit-D Deficiency, Fractures)  2. Breast cancer screening by mammogram  - MM 3D SCREEN BREAST BILATERAL; Future  3.  Vitamin D deficiency  - VITAMIN D 25 Hydroxy (Vit-D Deficiency, Fractures)  4. Long-term use of high-risk medication  - CBC with Differential/Platelet - COMPLETE METABOLIC PANEL WITH GFR  5. Lipid screening  - Lipid panel    -USPSTF grade A and B recommendations reviewed with patient; age-appropriate recommendations, preventive care, screening tests, etc discussed and encouraged; healthy living encouraged; see AVS for patient education given to patient -Discussed importance of 150 minutes of physical activity weekly, eat two servings of fish weekly, eat one serving of tree nuts ( cashews, pistachios, pecans, almonds.Marland Kitchen) every other day, eat 6 servings of fruit/vegetables daily and drink plenty of water and avoid sweet beverages.   -Reviewed Health Maintenance: Yes.

## 2022-08-10 ENCOUNTER — Ambulatory Visit (INDEPENDENT_AMBULATORY_CARE_PROVIDER_SITE_OTHER): Payer: 59 | Admitting: Family Medicine

## 2022-08-10 ENCOUNTER — Encounter: Payer: Self-pay | Admitting: Family Medicine

## 2022-08-10 VITALS — BP 122/70 | HR 91 | Resp 16 | Ht 63.0 in | Wt 148.0 lb

## 2022-08-10 DIAGNOSIS — Z79899 Other long term (current) drug therapy: Secondary | ICD-10-CM | POA: Diagnosis not present

## 2022-08-10 DIAGNOSIS — Z13220 Encounter for screening for lipoid disorders: Secondary | ICD-10-CM

## 2022-08-10 DIAGNOSIS — E559 Vitamin D deficiency, unspecified: Secondary | ICD-10-CM | POA: Diagnosis not present

## 2022-08-10 DIAGNOSIS — Z1231 Encounter for screening mammogram for malignant neoplasm of breast: Secondary | ICD-10-CM

## 2022-08-10 DIAGNOSIS — Z0000 Encounter for general adult medical examination without abnormal findings: Secondary | ICD-10-CM

## 2022-08-11 LAB — COMPLETE METABOLIC PANEL WITH GFR
AG Ratio: 2 (calc) (ref 1.0–2.5)
ALT: 15 U/L (ref 6–29)
AST: 15 U/L (ref 10–35)
Albumin: 4.4 g/dL (ref 3.6–5.1)
Alkaline phosphatase (APISO): 59 U/L (ref 37–153)
BUN: 18 mg/dL (ref 7–25)
CO2: 29 mmol/L (ref 20–32)
Calcium: 9.6 mg/dL (ref 8.6–10.4)
Chloride: 104 mmol/L (ref 98–110)
Creat: 0.71 mg/dL (ref 0.50–1.03)
Globulin: 2.2 g/dL (calc) (ref 1.9–3.7)
Glucose, Bld: 95 mg/dL (ref 65–99)
Potassium: 4.2 mmol/L (ref 3.5–5.3)
Sodium: 140 mmol/L (ref 135–146)
Total Bilirubin: 0.8 mg/dL (ref 0.2–1.2)
Total Protein: 6.6 g/dL (ref 6.1–8.1)
eGFR: 101 mL/min/{1.73_m2} (ref >=60)

## 2022-08-11 LAB — LIPID PANEL
Cholesterol: 213 mg/dL — ABNORMAL HIGH (ref <200)
HDL: 74 mg/dL (ref >=50)
LDL Cholesterol (Calc): 116 mg/dL (calc) — ABNORMAL HIGH
Non-HDL Cholesterol (Calc): 139 mg/dL (calc) — ABNORMAL HIGH (ref <130)
Total CHOL/HDL Ratio: 2.9 (calc) (ref <5.0)
Triglycerides: 118 mg/dL (ref <150)

## 2022-08-11 LAB — CBC WITH DIFFERENTIAL/PLATELET
Absolute Monocytes: 454 cells/uL (ref 200–950)
Basophils Absolute: 38 cells/uL (ref 0–200)
Basophils Relative: 0.6 %
Eosinophils Absolute: 416 cells/uL (ref 15–500)
Eosinophils Relative: 6.5 %
HCT: 40.5 % (ref 35.0–45.0)
Hemoglobin: 14 g/dL (ref 11.7–15.5)
Lymphs Abs: 2016 cells/uL (ref 850–3900)
MCH: 29 pg (ref 27.0–33.0)
MCHC: 34.6 g/dL (ref 32.0–36.0)
MCV: 84 fL (ref 80.0–100.0)
MPV: 9.7 fL (ref 7.5–12.5)
Monocytes Relative: 7.1 %
Neutro Abs: 3475 cells/uL (ref 1500–7800)
Neutrophils Relative %: 54.3 %
Platelets: 280 10*3/uL (ref 140–400)
RBC: 4.82 10*6/uL (ref 3.80–5.10)
RDW: 13.3 % (ref 11.0–15.0)
Total Lymphocyte: 31.5 %
WBC: 6.4 10*3/uL (ref 3.8–10.8)

## 2022-08-11 LAB — VITAMIN D 25 HYDROXY (VIT D DEFICIENCY, FRACTURES): Vit D, 25-Hydroxy: 20 ng/mL — ABNORMAL LOW (ref 30–100)

## 2022-08-30 ENCOUNTER — Ambulatory Visit
Admission: RE | Admit: 2022-08-30 | Discharge: 2022-08-30 | Disposition: A | Discharge status: Home / Self Care | Payer: 59 | Attending: Gastroenterology | Admitting: Gastroenterology

## 2022-08-30 ENCOUNTER — Ambulatory Visit: Payer: 59 | Admitting: Certified Registered"

## 2022-08-30 ENCOUNTER — Encounter
Admission: RE | Disposition: A | Discharge status: Home / Self Care | Payer: Self-pay | Source: Home / Self Care | Attending: Gastroenterology

## 2022-08-30 ENCOUNTER — Other Ambulatory Visit: Payer: Self-pay

## 2022-08-30 ENCOUNTER — Encounter: Payer: Self-pay | Admitting: Gastroenterology

## 2022-08-30 DIAGNOSIS — Z1211 Encounter for screening for malignant neoplasm of colon: Secondary | ICD-10-CM | POA: Diagnosis present

## 2022-08-30 DIAGNOSIS — K219 Gastro-esophageal reflux disease without esophagitis: Secondary | ICD-10-CM | POA: Diagnosis not present

## 2022-08-30 DIAGNOSIS — Z860100 Personal history of colon polyps, unspecified: Secondary | ICD-10-CM

## 2022-08-30 DIAGNOSIS — K589 Irritable bowel syndrome without diarrhea: Secondary | ICD-10-CM | POA: Diagnosis not present

## 2022-08-30 HISTORY — PX: ESOPHAGOGASTRODUODENOSCOPY: SHX5428

## 2022-08-30 HISTORY — PX: COLONOSCOPY WITH PROPOFOL: SHX5780

## 2022-08-30 HISTORY — PX: BRAVO PH STUDY: SHX5421

## 2022-08-30 SURGERY — COLONOSCOPY WITH PROPOFOL
Anesthesia: General

## 2022-08-30 MED ORDER — PROPOFOL 10 MG/ML IV BOLUS
INTRAVENOUS | Status: AC
  Filled 2022-08-30: qty 20

## 2022-08-30 MED ORDER — PROPOFOL 10 MG/ML IV BOLUS
INTRAVENOUS | Status: AC
  Filled 2022-08-30: qty 40

## 2022-08-30 MED ORDER — SODIUM CHLORIDE 0.9 % IV SOLN: INTRAVENOUS | Status: DC

## 2022-08-30 MED ORDER — LIDOCAINE HCL (CARDIAC) PF 100 MG/5ML IV SOSY
PREFILLED_SYRINGE | INTRAVENOUS | Status: DC | PRN
  Administered 2022-08-30: 100 mg via INTRAVENOUS

## 2022-08-30 MED ORDER — PROPOFOL 10 MG/ML IV BOLUS
INTRAVENOUS | Status: DC | PRN
  Administered 2022-08-30 (×5): 20 mg via INTRAVENOUS
  Administered 2022-08-30: 100 mg via INTRAVENOUS
  Administered 2022-08-30: 160 ug/kg/min via INTRAVENOUS
  Administered 2022-08-30: 20 mg via INTRAVENOUS

## 2022-08-30 MED ORDER — LIDOCAINE HCL (PF) 2 % IJ SOLN
INTRAMUSCULAR | Status: AC
  Filled 2022-08-30: qty 5

## 2022-08-30 MED ORDER — PHENYLEPHRINE HCL (PRESSORS) 10 MG/ML IV SOLN
INTRAVENOUS | Status: AC
  Filled 2022-08-30: qty 1

## 2022-08-30 NOTE — Anesthesia Preprocedure Evaluation (Signed)
Anesthesia Evaluation  Patient identified by MRN, date of birth, ID band Patient awake    Reviewed: Allergy & Precautions, NPO status , Patient's Chart, lab work & pertinent test results  History of Anesthesia Complications Negative for: history of anesthetic complications  Airway Mallampati: II  TM Distance: >3 FB Neck ROM: Full    Dental no notable dental hx. (+) Teeth Intact   Pulmonary neg pulmonary ROS, neg sleep apnea, neg COPD, Patient abstained from smoking.Not current smoker   Pulmonary exam normal breath sounds clear to auscultation       Cardiovascular Exercise Tolerance: Good METS(-) hypertension(-) CAD and (-) Past MI negative cardio ROS (-) dysrhythmias  Rhythm:Regular Rate:Normal - Systolic murmurs    Neuro/Psych  PSYCHIATRIC DISORDERS Anxiety     negative neurological ROS     GI/Hepatic ,GERD  Medicated,,(+)     (-) substance abuse    Endo/Other  neg diabetes    Renal/GU negative Renal ROS     Musculoskeletal   Abdominal   Peds  Hematology   Anesthesia Other Findings Past Medical History: No date: Allergy No date: Cancer [facility])     Comment:  skin ca No date: Chronic abdominal pain No date: Chronic insomnia No date: Food allergy No date: Functional dyspepsia No date: GERD (gastroesophageal reflux disease) No date: IBS (irritable bowel syndrome) No date: Insomnia No date: Irritable bowel syndrome with diarrhea No date: Left arm weakness No date: Neck pain No date: Subacute maxillary sinusitis No date: Vitamin A deficiency  Reproductive/Obstetrics                             Anesthesia Physical Anesthesia Plan  ASA: 2  Anesthesia Plan: General   Post-op Pain Management: Minimal or no pain anticipated   Induction: Intravenous  PONV Risk Score and Plan: 3 and Propofol infusion, TIVA and Ondansetron  Airway Management Planned: Nasal Cannula  Additional  Equipment: None  Intra-op Plan:   Post-operative Plan:   Informed Consent: I have reviewed the patients History and Physical, chart, labs and discussed the procedure including the risks, benefits and alternatives for the proposed anesthesia with the patient or authorized representative who has indicated his/her understanding and acceptance.     Dental advisory given  Plan Discussed with: CRNA and Surgeon  Anesthesia Plan Comments: (Discussed risks of anesthesia with patient, including possibility of difficulty with spontaneous ventilation under anesthesia necessitating airway intervention, PONV, and rare risks such as cardiac or respiratory or neurological events, and allergic reactions. Discussed the role of CRNA in patient's perioperative care. Patient understands.)       Anesthesia Quick Evaluation

## 2022-08-30 NOTE — Progress Notes (Signed)
Bravo placement in Endo.Marland Kitchen..35 cm z line measurement so Bravo capsule placed at 29 cm. Expiration 10/25/23 and lot number [id number]. Pairing with recorder confirmed.

## 2022-08-30 NOTE — H&P (Signed)
Cephas Darby, MD [address]  [place], [place] [address]  Main: [phone]  Fax: [phone] Pager: [phone]  Primary Care Physician:  Steele Sizer, MD Primary Gastroenterologist:  Dr. Cephas Darby  Pre-Procedure History & Physical: HPI:  Helen Green is a 56 y.o. female is here for an endoscopy and colonoscopy.   Past Medical History:  Diagnosis Date   Allergy    Cancer ([place])    skin ca   Chronic abdominal pain    Chronic insomnia    Food allergy    Functional dyspepsia    GERD (gastroesophageal reflux disease)    IBS (irritable bowel syndrome)    Insomnia    Irritable bowel syndrome with diarrhea    Left arm weakness    Neck pain    Subacute maxillary sinusitis    Vitamin A deficiency     Past Surgical History:  Procedure Laterality Date   ABDOMINAL HYSTERECTOMY     COLONOSCOPY WITH PROPOFOL N/A 09/27/2017   Procedure: COLONOSCOPY WITH PROPOFOL;  Surgeon: Jonathon Bellows, MD;  Location: [facility] ENDOSCOPY;  Service: Gastroenterology;  Laterality: N/A;   ESOPHAGOGASTRODUODENOSCOPY (EGD) WITH PROPOFOL N/A 09/27/2017   Procedure: ESOPHAGOGASTRODUODENOSCOPY (EGD) WITH PROPOFOL;  Surgeon: Jonathon Bellows, MD;  Location: [facility] ENDOSCOPY;  Service: Gastroenterology;  Laterality: N/A;   TUBAL LIGATION      Prior to Admission medications   Medication Sig Start Date End Date Taking? Authorizing Provider  albuterol (VENTOLIN HFA) 108 (90 Base) MCG/ACT inhaler Inhale 2 puffs into the lungs every 6 (six) hours as needed for wheezing or shortness of breath. 07/24/22  Yes Sowles, Drue Stager, MD  DULoxetine (CYMBALTA) 30 MG capsule Take 1 capsule (30 mg total) by mouth daily. 07/24/22  Yes Sowles, Drue Stager, MD  fexofenadine [facility] ALLERGY) 180 MG tablet Take 1 tablet (180 mg total) by mouth as needed. 07/12/15  Yes Sowles, Drue Stager, MD  hyoscyamine (LEVSIN) 0.125 MG tablet Take 1 tablet (0.125 mg total) by mouth every 4 (four) hours as needed. 07/24/22  Yes Sowles,  Drue Stager, MD  meloxicam (MOBIC) 15 MG tablet Take 1 tablet (15 mg total) by mouth daily. 01/05/22  Yes Sowles, Drue Stager, MD  omeprazole (PRILOSEC) 40 MG capsule Take 1 capsule (40 mg total) by mouth 2 (two) times daily before a meal. 08/01/22  Yes Nahima Ales, Tally Due, MD  Fluticasone-Umeclidin-Vilant 100-62.5-25 MCG/ACT AEPB Inhale 1 each into the lungs daily at 12 noon. 01/05/22   Steele Sizer, MD    Allergies as of 08/01/2022 - Review Complete 08/01/2022  Allergen Reaction Noted   Avocado Hives, Shortness Of Breath, and Swelling 05/03/2015   Daucus carota Hives, Shortness Of Breath, and Swelling 07/30/2014   Other Hives, Shortness Of Breath, and Swelling 05/03/2015    Family History  Problem Relation Age of Onset   Diabetes Mother    Hyperlipidemia Mother    Hypertension Mother    Liver disease Father    Cancer Brother 57       brain cancer   Breast cancer Neg Hx     Social History   Socioeconomic History   Marital status: Married    Spouse name: [name]   Number of children: 2   Years of education: Not on file   Highest education level: Associate degree: occupational, Hotel manager, or vocational program  Occupational History   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      Right handed    Two story home   Social Determinants of Health   Financial Resource Strain: Low Risk  (08/10/2022)   Overall Financial Resource Strain (CARDIA)    Difficulty of Paying Living  Expenses: Not hard at all  Food Insecurity: No Food Insecurity (08/10/2022)   Hunger Vital Sign    Worried About Running Out of Food in the Last Year: Never true    [place] in the Last Year: Never true  Transportation Needs: No Transportation Needs (08/10/2022)   PRAPARE - Hydrologist (Medical): No    Lack of Transportation (Non-Medical): No  Physical Activity: Insufficiently Active (08/10/2022)   Exercise Vital Sign    Days of Exercise per Week: 3 days    Minutes of Exercise per Session: 30 min  Stress: No Stress Concern Present (08/10/2022)   [place]    Feeling of Stress : Not at all  Social Connections: [place] (08/10/2022)   Social Connection and Isolation Panel [NHANES]    Frequency of Communication with Friends and Family: More than three times a week    Frequency of Social Gatherings with Friends and Family: Once a week    Attends Religious Services: More than 4 times per year    Active Member of [employer] or Organizations: Yes    Attends Music therapist: More than 4 times per year    Marital Status: Married  Human resources officer Violence: Not At Risk (08/10/2022)   Humiliation, Afraid, Rape, and Kick questionnaire    Fear of Current or Ex-Partner: No    Emotionally Abused: No    Physically Abused: No    Sexually Abused: No    Review of Systems: See HPI, otherwise negative ROS  Physical Exam: BP 125/77   Pulse 71   Temp (!) 97.4 F (36.3 C) (Temporal)   Resp 18   Ht '5\' 3"'$  (1.6 m)   Wt 63.5 kg   SpO2 100%   BMI 24.80 kg/m  General:   Alert,  pleasant and cooperative in NAD Head:  Normocephalic and atraumatic. Neck:  Supple; no masses or thyromegaly. Lungs:  Clear throughout to auscultation.    Heart:  Regular rate and rhythm. Abdomen:  Soft, nontender and nondistended. Normal bowel sounds, without guarding, and without rebound.    Neurologic:  Alert and  oriented x4;  grossly normal neurologically.  Impression/Plan: Helen Green is here for an endoscopy and colonoscopy to be performed for [place] of chronic GERD, Tubular adenoma colon   Risks, benefits, limitations, and alternatives regarding  endoscopy and colonoscopy have been reviewed with the patient.  Questions have been answered.  All parties agreeable.   [name], MD  08/30/2022, 7:50 AM

## 2022-08-30 NOTE — Anesthesia Postprocedure Evaluation (Signed)
Anesthesia Post Note  Patient: Helen Green  Procedure(s) Performed: COLONOSCOPY WITH PROPOFOL BRAVO PH STUDY ESOPHAGOGASTRODUODENOSCOPY (EGD)  Patient location during evaluation: Endoscopy Anesthesia Type: General Level of consciousness: awake and alert Pain management: pain level controlled Vital Signs Assessment: post-procedure vital signs reviewed and stable Respiratory status: spontaneous breathing, nonlabored ventilation, respiratory function stable and patient connected to nasal cannula oxygen Cardiovascular status: blood pressure returned to baseline and stable Postop Assessment: no apparent nausea or vomiting Anesthetic complications: no   No notable events documented.   Last Vitals:  Vitals:   08/30/22 0856 08/30/22 0904  BP: 97/68 121/88  Pulse: 74 69  Resp: 14 15  Temp:    SpO2: 98% 99%    Last Pain:  Vitals:   08/30/22 0904  TempSrc:   PainSc: 0-No pain                 Arita Miss

## 2022-08-30 NOTE — Transfer of Care (Signed)
Immediate Anesthesia Transfer of Care Note  Patient: Helen Green  Procedure(s) Performed: COLONOSCOPY WITH PROPOFOL BRAVO PH STUDY ESOPHAGOGASTRODUODENOSCOPY (EGD)  Patient Location: PACU  Anesthesia Type:General  Level of Consciousness: drowsy  Airway & Oxygen Therapy: Patient Spontanous Breathing  Post-op Assessment: Report given to RN and Post -op Vital signs reviewed and stable  Post vital signs: Reviewed and stable  Last Vitals:  Vitals Value Taken Time  BP 106/69 08/30/22 0844  Temp 35.9 0844  Pulse 74 08/30/22 0844  Resp 15 08/30/22 0844  SpO2 97 % 08/30/22 0844  Vitals shown include unvalidated device data.  Last Pain:  Vitals:   08/30/22 0720  TempSrc: Temporal  PainSc: 0-No pain         Complications: No notable events documented.

## 2022-08-30 NOTE — Op Note (Signed)
[facility] Gastroenterology Patient Name: Helen Green Procedure Date: 08/30/2022 7:29 AM MRN: [id number] Account #: [id number] Date of Birth: 1966/07/31 Admit Type: Outpatient Age: 56 Room: [facility] ENDO ROOM 3 Gender: Female Note Status: Finalized Instrument Name: Altamese Cabal Endoscope [id number] Procedure:             Upper GI endoscopy Indications:           Esophageal reflux symptoms that persist despite                         appropriate therapy Providers:             Lin Landsman MD, MD Referring MD:          Bethena Roys. Sowles, MD (Referring MD) Complications:         No immediate complications. Estimated blood loss: None. Procedure:             Pre-Anesthesia Assessment:                        - Prior to the procedure, a History and Physical was                         performed, and patient medications and allergies were                         reviewed. The patient is competent. The risks and                         benefits of the procedure and the sedation options and                         risks were discussed with the patient. All questions                         were answered and informed consent was obtained.                         Patient identification and proposed procedure were                         verified by the physician, the nurse, the                         anesthesiologist, the anesthetist and the technician                         in the pre-procedure area in the procedure room in the                         endoscopy suite. Mental Status Examination: alert and                         oriented. Airway Examination: normal oropharyngeal                         airway and neck mobility. Respiratory Examination:                         clear  to auscultation. CV Examination: normal.                         Prophylactic Antibiotics: The patient does not require                         prophylactic antibiotics. Prior Anticoagulants: The                          patient has taken no anticoagulant or antiplatelet                         agents. ASA Grade Assessment: II - A patient with mild                         systemic disease. After reviewing the risks and                         benefits, the patient was deemed in satisfactory                         condition to undergo the procedure. The anesthesia                         plan was to use general anesthesia. Immediately prior                         to administration of medications, the patient was                         re-assessed for adequacy to receive sedatives. The                         heart rate, respiratory rate, oxygen saturations,                         blood pressure, adequacy of pulmonary ventilation, and                         response to care were monitored throughout the                         procedure. The physical status of the patient was                         re-assessed after the procedure.                        After obtaining informed consent, the endoscope was                         passed under direct vision. Throughout the procedure,                         the patient's blood pressure, pulse, and oxygen                         saturations were monitored continuously. The Endoscope  was introduced through the mouth, and advanced to the                         second part of duodenum. The upper GI endoscopy was                         accomplished without difficulty. The patient tolerated                         the procedure well. Findings:      The esophagus was normal. The BRAVO capsule with delivery system was       introduced through the mouth and advanced into the esophagus, such that       the BRAVO pH capsule was positioned 29 cm from the incisors, which was 6       cm proximal to the GE junction. The BRAVO pH capsule was then deployed       and attached to the esophageal mucosa. The delivery system was then        withdrawn. Endoscopy was utilized for probe placement and diagnostic       evaluation.      The stomach was normal.      The examined duodenum was normal.      Esophagogastric landmarks were identified: the gastroesophageal junction       was found at 35 cm from the incisors.      The examined esophagus was normal.      The cardia and gastric fundus were normal on retroflexion. Impression:            - Normal esophagus.                        - Normal stomach.                        - Normal examined duodenum.                        - Esophagogastric landmarks identified.                        - Normal esophagus.                        - The BRAVO pH capsule was deployed.                        - No specimens collected. Recommendation:        - Restart Prilosec (omeprazole) 40 mg PO BID after 2                         days.                        - Proceed with colonoscopy as scheduled                        See colonoscopy report Procedure Code(s):     --- Professional ---                        [phone], Esophagogastroduodenoscopy, flexible,  transoral; diagnostic, including collection of                         specimen(s) by brushing or washing, when performed                         (separate procedure) Diagnosis Code(s):     --- Professional ---                        K21.9, Gastro-esophageal reflux disease without                         esophagitis CPT copyright 2022 American Medical Association. All rights reserved. The codes documented in this report are preliminary and upon coder review may  be revised to meet current compliance requirements. Dr. Ulyess Mort Lin Landsman MD, MD 08/30/2022 8:27:12 AM This report has been signed electronically. Number of Addenda: 0 Note Initiated On: 08/30/2022 7:29 AM Estimated Blood Loss:  Estimated blood loss: none.      [facility]

## 2022-08-30 NOTE — Op Note (Signed)
[facility] Gastroenterology Patient Name: Helen Green Procedure Date: 08/30/2022 7:28 AM MRN: [id number] Account #: [id number] Date of Birth: 09-03-1966 Admit Type: Outpatient Age: 56 Room: [facility] ENDO ROOM 3 Gender: Female Note Status: Finalized Instrument Name: Jasper Riling [id number] Procedure:             Colonoscopy Indications:           Surveillance: Personal history of adenomatous polyps                         on last colonoscopy 5 years ago, Last colonoscopy:                         December 2018 Providers:             Lin Landsman MD, MD Referring MD:          Bethena Roys. Sowles, MD (Referring MD) Medicines:             General Anesthesia Complications:         No immediate complications. Estimated blood loss: None. Procedure:             Pre-Anesthesia Assessment:                        - Prior to the procedure, a History and Physical was                         performed, and patient medications and allergies were                         reviewed. The patient is competent. The risks and                         benefits of the procedure and the sedation options and                         risks were discussed with the patient. All questions                         were answered and informed consent was obtained.                         Patient identification and proposed procedure were                         verified by the physician, the nurse, the                         anesthesiologist, the anesthetist and the technician                         in the pre-procedure area in the procedure room in the                         endoscopy suite. Mental Status Examination: alert and                         oriented. Airway Examination: normal oropharyngeal  airway and neck mobility. Respiratory Examination:                         clear to auscultation. CV Examination: normal.                         Prophylactic Antibiotics: The  patient does not require                         prophylactic antibiotics. Prior Anticoagulants: The                         patient has taken no anticoagulant or antiplatelet                         agents. ASA Grade Assessment: II - A patient with mild                         systemic disease. After reviewing the risks and                         benefits, the patient was deemed in satisfactory                         condition to undergo the procedure. The anesthesia                         plan was to use general anesthesia. Immediately prior                         to administration of medications, the patient was                         re-assessed for adequacy to receive sedatives. The                         heart rate, respiratory rate, oxygen saturations,                         blood pressure, adequacy of pulmonary ventilation, and                         response to care were monitored throughout the                         procedure. The physical status of the patient was                         re-assessed after the procedure.                        After obtaining informed consent, the colonoscope was                         passed under direct vision. Throughout the procedure,                         the patient's blood pressure, pulse, and oxygen  saturations were monitored continuously. The                         Colonoscope was introduced through the anus and                         advanced to the the cecum, identified by appendiceal                         orifice and ileocecal valve. The colonoscopy was                         performed without difficulty. The patient tolerated                         the procedure well. The quality of the bowel                         preparation was evaluated using the BBPS [facility] Bowel                         Preparation Scale) with scores of: Right Colon = 3,                         Transverse Colon = 3 and  Left Colon = 3 (entire mucosa                         seen well with no residual staining, small fragments                         of stool or opaque liquid). The total BBPS score                         equals 9. The ileocecal valve, appendiceal orifice,                         and rectum were photographed. Findings:      The perianal and digital rectal examinations were normal. Pertinent       negatives include normal sphincter tone and no palpable rectal lesions.      The entire examined colon appeared normal.      The retroflexed view of the distal rectum and anal verge was normal and       showed no anal or rectal abnormalities. Impression:            - The entire examined colon is normal.                        - The distal rectum and anal verge are normal on                         retroflexion view.                        - No specimens collected. Recommendation:        - Discharge patient to home (with escort).                        -  Resume previous diet today.                        - Continue present medications.                        - Repeat colonoscopy in 10 years for screening                         purposes. Procedure Code(s):     --- Professional ---                        M0802, Colorectal cancer screening; colonoscopy on                         individual at high risk Diagnosis Code(s):     --- Professional ---                        Z86.010, Personal history of colonic polyps CPT copyright 2022 American Medical Association. All rights reserved. The codes documented in this report are preliminary and upon coder review may  be revised to meet current compliance requirements. Dr. Ulyess Mort Lin Landsman MD, MD 08/30/2022 8:42:52 AM This report has been signed electronically. Number of Addenda: 0 Note Initiated On: 08/30/2022 7:28 AM Scope Withdrawal Time: 0 hours 7 minutes 24 seconds  Total Procedure Duration: 0 hours 10 minutes 34 seconds  Estimated  Blood Loss:  Estimated blood loss: none.      [facility]

## 2022-08-31 ENCOUNTER — Encounter: Payer: Self-pay | Admitting: Gastroenterology

## 2022-10-05 ENCOUNTER — Emergency Department (HOSPITAL_COMMUNITY): Payer: 59

## 2022-10-05 ENCOUNTER — Emergency Department (HOSPITAL_COMMUNITY)
Admission: EM | Admit: 2022-10-05 | Discharge: 2022-10-05 | Discharge status: Against Medical Advice | Payer: 59 | Attending: Emergency Medicine | Admitting: Emergency Medicine

## 2022-10-05 ENCOUNTER — Other Ambulatory Visit: Payer: Self-pay

## 2022-10-05 ENCOUNTER — Encounter (HOSPITAL_COMMUNITY): Payer: Self-pay

## 2022-10-05 DIAGNOSIS — M25512 Pain in left shoulder: Secondary | ICD-10-CM | POA: Insufficient documentation

## 2022-10-05 DIAGNOSIS — M542 Cervicalgia: Secondary | ICD-10-CM | POA: Diagnosis present

## 2022-10-05 DIAGNOSIS — Z5321 Procedure and treatment not carried out due to patient leaving prior to being seen by health care provider: Secondary | ICD-10-CM | POA: Insufficient documentation

## 2022-10-05 DIAGNOSIS — Y92410 Unspecified street and highway as the place of occurrence of the external cause: Secondary | ICD-10-CM | POA: Diagnosis not present

## 2022-10-05 DIAGNOSIS — R208 Other disturbances of skin sensation: Secondary | ICD-10-CM | POA: Diagnosis not present

## 2022-10-05 DIAGNOSIS — R519 Headache, unspecified: Secondary | ICD-10-CM | POA: Diagnosis not present

## 2022-10-05 DIAGNOSIS — R1031 Right lower quadrant pain: Secondary | ICD-10-CM | POA: Insufficient documentation

## 2022-10-05 MED ORDER — ACETAMINOPHEN 325 MG PO TABS: 650.0000 mg | ORAL_TABLET | Freq: Once | ORAL | Status: DC
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
## 2022-10-05 NOTE — ED Notes (Signed)
Called patient x3 for room, no response.

## 2022-10-05 NOTE — ED Notes (Signed)
Patient called again for evaluation, no response. Patient pulled off the floor at this time.

## 2022-10-05 NOTE — ED Notes (Signed)
Pt did not want the tylenol at this time until she can eat something.
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
## 2022-10-09 ENCOUNTER — Ambulatory Visit
Admission: RE | Admit: 2022-10-09 | Discharge: 2022-10-09 | Disposition: A | Discharge status: Home / Self Care | Payer: 59 | Source: Ambulatory Visit | Attending: Family Medicine | Admitting: Family Medicine

## 2022-10-09 DIAGNOSIS — Z1231 Encounter for screening mammogram for malignant neoplasm of breast: Secondary | ICD-10-CM | POA: Insufficient documentation

## 2022-10-09 DIAGNOSIS — Z0000 Encounter for general adult medical examination without abnormal findings: Secondary | ICD-10-CM

## 2022-10-11 ENCOUNTER — Ambulatory Visit: Payer: Self-pay | Admitting: Family Medicine

## 2022-10-11 ENCOUNTER — Encounter: Payer: Self-pay | Admitting: Family Medicine

## 2022-10-11 VITALS — BP 116/70 | HR 86 | Temp 98.0°F | Resp 16 | Ht 63.0 in | Wt 146.1 lb

## 2022-10-11 DIAGNOSIS — M542 Cervicalgia: Secondary | ICD-10-CM

## 2022-10-11 DIAGNOSIS — R519 Headache, unspecified: Secondary | ICD-10-CM

## 2022-10-11 MED ORDER — METHOCARBAMOL 500 MG PO TABS: 500.0000 mg | ORAL_TABLET | Freq: Three times a day (TID) | ORAL | 1 refills | Status: DC | PRN

## 2022-10-11 MED ORDER — AMITRIPTYLINE HCL 25 MG PO TABS: 25.0000 mg | ORAL_TABLET | Freq: Every day | ORAL | 0 refills | Status: DC

## 2022-10-11 NOTE — Patient Instructions (Addendum)
[facility] [address] [place], [place] [address] Phone:  [phone] Fax:  [phone]  Cervical Sprain A cervical sprain is also called a neck sprain. It is a stretch or tear in one or more ligaments in the neck. Ligaments are tissues that connect bones to each other. Neck sprains can be mild, bad, or very bad. A very bad sprain in the neck can cause the bones in the neck to be unstable. This can damage the spinal cord. It can also cause serious problems in the brain, spinal cord, and nerves (nervous system). Most neck sprains heal in 4-6 weeks. It can take more or less time depending on: What caused the injury. The amount of injury. What are the causes? Neck sprains may be caused by trauma, such as: An injury from an accident in a vehicle such as a car or boat. A fall. The head and neck being moved front to back or side to side all of a sudden (whiplash injury). Mild neck sprains may be caused by wear and tear over time. What increases the risk? The following factors may make you more likely to develop this condition: Taking part in activities that put you at high risk of hurting your neck. These include: Contact sports. Animator. Gymnastics. Diving. Taking risks when driving or riding in a vehicle such as a car or boat. Arthritis caused by wear and tear of the joints in the spine. The neck not being very strong or flexible. Having had a neck injury in the past. Poor posture. Spending a lot of time in certain positions that put stress on the neck. This may be from sitting at a computer for a long time. What are the signs or symptoms? Symptoms of this condition include: Your neck, shoulders, or upper back feeling: Painful or sore. Stiff. Tender. Swollen. Hot, or like it is burning. Sudden tightening of neck muscles (spasms). Not being able to move the neck very much. Headache. Feeling dizzy. Feeling like you may vomit, or  vomiting. Having a hand or arm that: Feels weak. Loses feeling (feels numb). Tingles. You may get symptoms right away after injury, or you may get them over a few days. In some cases, symptoms may go away with treatment and come back over time. How is this treated? This condition is treated by: Resting your neck. Icing the part of your neck that is hurt. Doing exercises to restore movement and strength to your neck (physical therapy). If there is no swelling, you may use heat therapy 2-3 days after the injury took place. If your injury is very bad, treatment may also include: Keeping your neck in place for a length of time. This may be done using: A neck collar. This supports your chin and the back of your head. A cervical traction device. This is a sling that holds up your head. The sling removes weight and pressure from your neck. It may also help to relieve pain. Medicines that help with: Pain. Irritation and swelling (inflammation). Medicines that help to relax your muscles (muscle relaxants). Surgery. This is rare. Follow these instructions at home: Medicines  Take over-the-counter and prescription medicines only as told by your doctor. Ask your doctor if the medicine prescribed to you: Requires you to avoid driving or using heavy machinery. Can cause trouble pooping (constipation). You may need to take these actions to prevent or treat trouble pooping: Drink enough fluid to keep your pee (urine) pale yellow. Take over-the-counter or  prescription medicines. Eat foods that are high in fiber. These include beans, whole grains, and fresh fruits and vegetables. Limit foods that are high in fat and sugar. These include fried or sweet foods. If you have a neck collar: Wear it as told by your doctor. Do not take it off unless told. Ask your doctor before adjusting your collar. If you have long hair, keep it outside of the collar. Ask your doctor if you may take off the collar for  cleaning and bathing. If you may take off the collar: Follow instructions about how to take it off safely. Clean it by hand with mild soap and water. Let it air-dry fully. If your collar has pads that you can take out: Take the pads out every 1-2 days. Wash them by hand with soap and water. Let the pads air-dry fully before you put them back in the collar. Tell your doctor if your skin under the collar has irritation or sores. Managing pain, stiffness, and swelling     Use a cervical traction device, if told by your doctor. If told, put ice on the affected area. To do this: Put ice in a plastic bag. Place a towel between your skin and the bag. Leave the ice on for 20 minutes, 2-3 times a day. If told, put heat on the affected area. Do this before exercise or as often as told by your doctor. Use the heat source that your doctor recommends, such as a moist heat pack or a heating pad. Place a towel between your skin and the heat source. Leave the heat on for 20-30 minutes. Take the heat off if your skin turns bright red. This is very important if you cannot feel pain, heat, or cold. You may have a greater risk of getting burned. Activity Do not drive while wearing a neck collar. If you do not have a neck collar, ask if it is safe to drive while your neck heals. Do not lift anything that is heavier than 10 lb (4.5 kg), or the limit that you are told, until your doctor tells you that it is safe. Rest as told by your doctor. Do exercises as told by your doctor or physical therapist. Return to your normal activities as told by your doctor. Avoid positions and activities that make you feel worse. Ask your doctor what activities are safe for you. General instructions Do not use any products that contain nicotine or tobacco, such as cigarettes, e-cigarettes, and chewing tobacco. These can delay healing. If you need help quitting, ask your doctor. Keep all follow-up visits as told by your doctor or  physical therapist. This is important. How is this prevented? To prevent a neck sprain from happening again: Practice good posture. Adjust your workstation to help you do this. Exercise regularly as told by your doctor or physical therapist. Avoid activities that are risky or may cause a neck sprain. Contact a doctor if: Your symptoms get worse. Your symptoms do not get better after 2 weeks of treatment. Your pain gets worse. Medicine does not help your pain. You have new symptoms that you cannot explain. Your neck collar gives you sores on your skin or bothers your skin. Get help right away if: You have very bad pain. You get any of the following in any part of your body: Loss of feeling. Tingling. Weakness. You cannot move a part of your body. You have neck pain and either of these: Very bad dizziness. A very bad headache.  Summary A cervical sprain is also called a neck sprain. It is a stretch or tear in one or more ligaments in the neck. Ligaments are tissues that connect bones. Neck sprains may be caused by trauma, such as an injury or a fall. You may get symptoms right away after injury, or you may get them over a few days. Neck sprains may be treated with rest, heat, ice, medicines, exercise, and surgery. This information is not intended to replace advice given to you by your health care provider. Make sure you discuss any questions you have with your health care provider. Document Revised: 01/15/2022 Document Reviewed: 06/17/2019 Elsevier Patient Education  [place], Adult  A concussion is a brain injury from a hard, direct hit (trauma) to your head or body. This hit causes your brain to quickly shake back and forth inside your skull. A concussion may also be called a mild traumatic brain injury (TBI). Healing from this injury can take time. The effects of a concussion can be serious. If you have a concussion, you should be very careful to avoid  having a second concussion. What are the causes? This condition is caused by: A direct hit to your head. A quick and sudden movement of the head or neck, such as in a car crash. What are the signs or symptoms? The signs of a concussion can be hard to notice. They may be missed by you, family members, and doctors. You may look fine on the outside but may not act or feel normal. Physical symptoms Headaches or feeling dizzy. Problems with body balance. Being sensitive to light or noise. Vomiting or feeling like you may vomit. Being tired. Problems seeing or hearing. Seizure. Mental and emotional symptoms Feeling grouchy (irritable) or having mood changes. Problems remembering things. Trouble focusing your mind (concentrating), organizing, or making decisions. Not sleeping or eating as you used to. Being slow to think, act, react, speak, or read. Feeling worried or nervous (anxious). Feeling sad (depressed). How is this treated? This condition may be treated by: Stopping sports or activity if you are injured. Resting your body and your mind. Being watched carefully, often at home. Medicines to help with symptoms such as: Headaches. Feeling like you may vomit. Problems with sleep. You may need to go to a concussion clinic or a place to help you recover (rehab). Follow these instructions at home: Activity Limit activities that need a lot of thought or focus, such as: Homework or work for your job. Watching TV. Using the computer or phone. Playing memory games and puzzles. Get rest because this helps your brain heal. Make sure you: Get plenty of sleep. Most adults should get 7-9 hours of sleep each night. Rest during the day. Take naps or breaks when you feel tired. Avoid activity or exercise that takes a lot of effort until your doctor says it is safe. Stop any activity that makes symptoms worse. Your doctor may tell you to do light exercise like walking. Do not do activities  that could cause a second concussion, such as riding a bike or playing sports. Ask your doctor when you can return to your normal activities, such as school, work, sports, and driving. Your ability to react may be slower. Do not do these activities if you are dizzy. General instructions  Take over-the-counter and prescription medicines only as told by your doctor. Avoid taking strong pain medicines (opioids) after a concussion. Do not drink alcohol until your doctor  says you can. Watch your symptoms and tell other people to do the same. Other problems can occur after a concussion. Tell your work Freight forwarder, teachers, Government social research officer, school counselor, coach, or Product/process development scientist about your injury and symptoms. Tell them about what you can or cannot do. See a mental health therapist if you keep feeling worried and nervous or sad. Keep all follow-up visits. Your doctor will check on your recovery and give you a plan for returning to activities. How is this prevented? It is very important that you do not get another brain injury. In rare cases, another injury can cause brain damage that will not go away, brain swelling, or death. The risk of this is greatest in the first 7-10 days after a head injury. To avoid injuries: Stop activities that could lead to a second concussion, such as contact sports, until your doctor says it is okay. When you return to sports or activities: Do not crash into other players. This is how most concussions happen. Follow the rules. Respect other players. Do not engage in violent behavior while playing. Get regular exercise. Do strength and balance training. Wear a helmet that fits you well during sports, biking, or other activities. Helmets can help protect you from serious skull and brain injuries, but they may not protect you from a concussion. Even when wearing a helmet, you should avoid being hit in the head. Where to find more information Centers for Disease Control and  Prevention: [employer]cy Contact a doctor if: Your symptoms do not get better or get worse. You have new symptoms. You have another injury. Your balance gets worse. You have changes in how you act. Get help right away if: You have very bad headaches or your headaches get worse. You have any of these problems: Feeling weak or numb in any part of your body. Slurred speech. Changes in how you see (vision). Feeling mixed up (confused). You vomit often. You faint or other people have trouble waking you up. You have a seizure. These symptoms may be an emergency. Get help right away. Call 911. Do not wait to see if the symptoms will go away. Do not drive yourself to the hospital. Also, get help right away if: You have thoughts of hurting yourself or others. Take one of these steps if you feel like you may hurt yourself or others, or have thoughts about taking your own life: Go to your nearest emergency room. Call 911. Call the [facility] at [phone] or 988. This is open 24 hours a day. Text the Crisis Text Line at (615) 562-8489. This information is not intended to replace advice given to you by your health care provider. Make sure you discuss any questions you have with your health care provider. Document Revised: 03/02/2022 Document Reviewed: 03/02/2022 Elsevier Patient Education  [place].
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
## 2022-10-25 ENCOUNTER — Telehealth: Payer: Self-pay | Admitting: Family Medicine

## 2022-10-25 NOTE — Telephone Encounter (Signed)
Copied from [place] [id number]. Topic: Referral - Request for Referral >> Oct 25, 2022  3:28 PM Everette C wrote: Has patient seen PCP for this complaint? No. *If NO, is insurance requiring patient see PCP for this issue before PCP can refer them? Referral for which specialty: Neurology / Concussion specialists  Preferred provider/office: [facility] please fax [phone] Attention Ronalee Belts  Reason for referral: Concussion concerns

## 2022-10-26 NOTE — Telephone Encounter (Signed)
Pt states has not heard from the clinic, she reached out to them and was told needed referral. I have given patient information with release ID# and hopefully that helps. If she had any issues I advised to give [place] a call back. Pt verbalized understanding. Given information to patient: Referral has been sent to [facility]   P: [phone] F: [phone]   Release ID # [id number]

## 2022-11-01 ENCOUNTER — Ambulatory Visit (INDEPENDENT_AMBULATORY_CARE_PROVIDER_SITE_OTHER): Payer: Self-pay | Admitting: Family Medicine

## 2022-11-01 ENCOUNTER — Encounter: Payer: Self-pay | Admitting: Family Medicine

## 2022-11-01 DIAGNOSIS — F0781 Postconcussional syndrome: Secondary | ICD-10-CM
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
## 2022-11-19 ENCOUNTER — Ambulatory Visit (INDEPENDENT_AMBULATORY_CARE_PROVIDER_SITE_OTHER): Payer: 59 | Admitting: Family Medicine

## 2022-11-19 ENCOUNTER — Encounter: Payer: Self-pay | Admitting: Family Medicine

## 2022-11-19 VITALS — BP 122/70 | HR 68 | Resp 16 | Ht 63.0 in | Wt 146.0 lb

## 2022-11-19 DIAGNOSIS — F0781 Postconcussional syndrome: Secondary | ICD-10-CM | POA: Diagnosis not present

## 2022-11-19 DIAGNOSIS — M542 Cervicalgia: Secondary | ICD-10-CM

## 2022-11-19 DIAGNOSIS — G44309 Post-traumatic headache, unspecified, not intractable: Secondary | ICD-10-CM | POA: Diagnosis not present
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
## 2022-12-03 ENCOUNTER — Encounter: Payer: Self-pay | Admitting: Gastroenterology

## 2022-12-03 ENCOUNTER — Ambulatory Visit: Payer: 59 | Admitting: Gastroenterology

## 2022-12-11 ENCOUNTER — Other Ambulatory Visit: Payer: Self-pay

## 2022-12-11 ENCOUNTER — Emergency Department: Payer: 59

## 2022-12-11 ENCOUNTER — Emergency Department
Admission: EM | Admit: 2022-12-11 | Discharge: 2022-12-11 | Disposition: A | Discharge status: Home / Self Care | Payer: 59 | Attending: Emergency Medicine | Admitting: Emergency Medicine

## 2022-12-11 DIAGNOSIS — F0781 Postconcussional syndrome: Secondary | ICD-10-CM | POA: Insufficient documentation

## 2022-12-11 DIAGNOSIS — R519 Headache, unspecified: Secondary | ICD-10-CM | POA: Diagnosis not present

## 2022-12-11 LAB — BASIC METABOLIC PANEL
Anion gap: 9 (ref 5–15)
BUN: 11 mg/dL (ref 6–20)
CO2: 28 mmol/L (ref 22–32)
Calcium: 9.2 mg/dL (ref 8.9–10.3)
Chloride: 102 mmol/L (ref 98–111)
Creatinine, Ser: 0.67 mg/dL (ref 0.44–1.00)
GFR, Estimated: >60 mL/min (ref >60)
Glucose, Bld: 109 mg/dL — ABNORMAL HIGH (ref 70–99)
Potassium: 3.8 mmol/L (ref 3.5–5.1)
Sodium: 139 mmol/L (ref 135–145)

## 2022-12-11 LAB — CBC WITH DIFFERENTIAL/PLATELET
Abs Immature Granulocytes: 0.02 10*3/uL (ref 0.00–0.07)
Basophils Absolute: 0 10*3/uL (ref 0.0–0.1)
Basophils Relative: 1 %
Eosinophils Absolute: 0.4 10*3/uL (ref 0.0–0.5)
Eosinophils Relative: 7 %
HCT: 41.5 % (ref 36.0–46.0)
Hemoglobin: 13.7 g/dL (ref 12.0–15.0)
Immature Granulocytes: 0 %
Lymphocytes Relative: 36 %
Lymphs Abs: 2.3 10*3/uL (ref 0.7–4.0)
MCH: 28.2 pg (ref 26.0–34.0)
MCHC: 33 g/dL (ref 30.0–36.0)
MCV: 85.6 fL (ref 80.0–100.0)
Monocytes Absolute: 0.5 10*3/uL (ref 0.1–1.0)
Monocytes Relative: 7 %
Neutro Abs: 3.1 10*3/uL (ref 1.7–7.7)
Neutrophils Relative %: 49 %
Platelets: 266 10*3/uL (ref 150–400)
RBC: 4.85 MIL/uL (ref 3.87–5.11)
RDW: 13.3 % (ref 11.5–15.5)
WBC: 6.3 10*3/uL (ref 4.0–10.5)
nRBC: 0 % (ref 0.0–0.2)

## 2022-12-11 MED ORDER — NAPROXEN 500 MG PO TABS: 500.0000 mg | ORAL_TABLET | Freq: Two times a day (BID) | ORAL | 11 refills | Status: AC
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
## 2022-12-11 NOTE — Discharge Instructions (Addendum)
-  You may trial the nortriptyline in the naproxen to see if this helps improve your symptoms.  -Please follow-up with your neurologist as discussed.  -Return to the emergency department anytime if you begin to experience any new or worsening symptoms.
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
## 2022-12-14 ENCOUNTER — Ambulatory Visit: Payer: 59 | Admitting: Family Medicine

## 2022-12-14 ENCOUNTER — Encounter: Payer: Self-pay | Admitting: Family Medicine

## 2022-12-14 VITALS — BP 124/72 | HR 82 | Temp 98.1°F | Resp 16 | Ht 63.0 in | Wt 146.9 lb

## 2022-12-14 DIAGNOSIS — G4701 Insomnia due to medical condition: Secondary | ICD-10-CM

## 2022-12-14 DIAGNOSIS — F0781 Postconcussional syndrome: Secondary | ICD-10-CM | POA: Diagnosis not present

## 2022-12-14 DIAGNOSIS — Z0289 Encounter for other administrative examinations: Secondary | ICD-10-CM

## 2022-12-14 DIAGNOSIS — G44309 Post-traumatic headache, unspecified, not intractable: Secondary | ICD-10-CM

## 2022-12-14 DIAGNOSIS — M542 Cervicalgia: Secondary | ICD-10-CM | POA: Diagnosis not present
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
## 2022-12-20 ENCOUNTER — Telehealth: Payer: Self-pay | Admitting: Family Medicine

## 2022-12-20 NOTE — Telephone Encounter (Signed)
Copied from [place] [id number]. Topic: Medical Record Request - Attorney/Litigation >> Dec 20, 2022 12:56 PM Oley Balm A wrote: Reason for CRM: Marchelle Folks from [place] is calling to request medical records for pt.  (10/05/2022-Present) Phone Number: [phone] Fax Number: [phone]  Please  Attn to [facility]

## 2022-12-20 NOTE — Telephone Encounter (Signed)
Copied from [place] [id number]. Topic: Medical Record Request - Attorney/Litigation >> Dec 20, 2022 12:56 PM Oley Balm A wrote: Reason for CRM: Marchelle Folks from [place] is calling to request medical records for pt.  (10/05/2022-Present) Phone Number: [phone] Fax Number: [phone]  Please  Attn to [facility] >> Dec 20, 2022  1:25 PM Ian Bushman wrote: [employer] firm will fax a signed medical release form

## 2023-01-01 ENCOUNTER — Telehealth: Payer: Self-pay | Admitting: Family Medicine

## 2023-01-01 NOTE — Telephone Encounter (Signed)
FYI medical records for this pt to be done by [facility] to be sent to [facility], and billing statements to be done by the billing dept

## 2023-01-22 NOTE — Progress Notes (Deleted)
Name: Helen Green   MRN: [id number]    DOB: September 10, 1966   Date:01/22/2023       Progress Note  Subjective  Chief Complaint  Follow Up  HPI  Hot flashes: she reached menopause about 10 years ago, she started on duloxetine Feb 2021 and was  doing well on 30 mg but the 60 mg dose was too high so she stopped taking and hot flashes are worse   GERD: she was off Dexilant but symptoms started to get worse again, with bloating and burning sensation and regurgitation. Evaluated by Dr. Marius Ditch She went back on Omeprazole but ran out and symptoms are severe, she will see GI soon, she is following a GERD appropriate diet and back on H2blocker, she states the pain goes from stomach to throat and the burning goes to her head. Advised to resume PPI  IBS with diarrhea/constipation: she has an upcoming appointment with her gastroenterologist for GERD. IBS still goes from loose stools to constipation but no recent episodes of lower abdominal pain, she takes Levsin prn    Lateral epicondylitis: she continues to have soreness on her hands and fingers but now the pain is also on lateral epicondyle on both arms, she works typing , pain worse when picking up a pot and grocerie bags causes significant pain   Hand pains: likely OA, evaluation for inflammatory arthritis negative and no swelling or increase in warmth noticed  RAD: she used to have a cough that would last after an URI, but more persistent since COVID in 2021 , she has an intermittent dry cough about once a week,  she forgets to take singulair. I gave her Trelegy , only using prn, she states she used initially for one month and symptoms resolved, but now that she takes it prn and seems to be stable.   Anxiety: she was seen Feb 2021. 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 She is taking Duloxetine 30 mg and was doing well, we tried going up on dose to 60 mg she could not tolerate it, she felt loopy but even without medication her symptoms are  controlled at this time.   Vitamin D deficiency: she needs to resume supplementation   Patient Active Problem List   Diagnosis Date Noted   Personal history of colonic polyps 08/30/2022   Chronic GERD 08/30/2022   Gastroesophageal reflux disease 08/30/2022   Lateral epicondylitis of both elbows 07/24/2022   Arthralgia of both hands 07/24/2022   Hot flashes due to menopause 01/05/2022   Anxiety 01/05/2022   Reactive airway disease 07/05/2020   Insomnia, persistent 05/03/2015   Functional dyspepsia 05/03/2015   Gastro-esophageal reflux disease without esophagitis 05/03/2015   Perennial allergic rhinitis with seasonal variation 05/03/2015   Arthritis of temporomandibular joint 05/03/2015   Vitamin D deficiency 05/03/2015   Irritable bowel syndrome with both constipation and diarrhea 05/03/2015    Past Surgical History:  Procedure Laterality Date   ABDOMINAL HYSTERECTOMY     BRAVO [place] STUDY N/A 08/30/2022   Procedure: BRAVO [place] STUDY;  Surgeon: Lin Landsman, MD;  Location: [facility] ENDOSCOPY;  Service: Gastroenterology;  Laterality: N/A;  on ppi   COLONOSCOPY WITH PROPOFOL N/A 09/27/2017   Procedure: COLONOSCOPY WITH PROPOFOL;  Surgeon: Jonathon Bellows, MD;  Location: [facility] ENDOSCOPY;  Service: Gastroenterology;  Laterality: N/A;   COLONOSCOPY WITH PROPOFOL N/A 08/30/2022   Procedure: COLONOSCOPY WITH PROPOFOL;  Surgeon: Lin Landsman, MD;  Location: [facility] ENDOSCOPY;  Service: Gastroenterology;  Laterality: N/A;  ESOPHAGOGASTRODUODENOSCOPY N/A 08/30/2022   Procedure: ESOPHAGOGASTRODUODENOSCOPY (EGD);  Surgeon: Lin Landsman, MD;  Location: [facility] ENDOSCOPY;  Service: Gastroenterology;  Laterality: N/A;   ESOPHAGOGASTRODUODENOSCOPY (EGD) WITH PROPOFOL N/A 09/27/2017   Procedure: ESOPHAGOGASTRODUODENOSCOPY (EGD) WITH PROPOFOL;  Surgeon: Jonathon Bellows, MD;  Location: [facility] ENDOSCOPY;  Service: Gastroenterology;  Laterality: N/A;   TUBAL LIGATION      Family History  Problem Relation  Age of Onset   Diabetes Mother    Hyperlipidemia Mother    Hypertension Mother    Liver disease Father    Cancer Brother 58       brain cancer   Breast cancer Neg Hx     Social History   Tobacco Use   Smoking status: Never   Smokeless tobacco: Never  Substance Use Topics   Alcohol use: No    Alcohol/week: 0.0 standard drinks of alcohol     Current Outpatient Medications:    albuterol (VENTOLIN HFA) 108 (90 Base) MCG/ACT inhaler, Inhale 2 puffs into the lungs every 6 (six) hours as needed for wheezing or shortness of breath., Disp: 18 g, Rfl: 1   amitriptyline (ELAVIL) 25 MG tablet, Take 1 tablet (25 mg total) by mouth at bedtime. For post--traumatic headache, Disp: 90 tablet, Rfl: 0   DULoxetine (CYMBALTA) 30 MG capsule, Take 1 capsule (30 mg total) by mouth daily., Disp: 90 capsule, Rfl: 1   fexofenadine (ALLEGRA ALLERGY) 180 MG tablet, Take 1 tablet (180 mg total) by mouth as needed., Disp: 30 tablet, Rfl: 5   Fluticasone-Umeclidin-Vilant 100-62.5-25 MCG/ACT AEPB, Inhale 1 each into the lungs daily at 12 noon., Disp: 60 each, Rfl: 2   hyoscyamine (LEVSIN) 0.125 MG tablet, Take 1 tablet (0.125 mg total) by mouth every 4 (four) hours as needed., Disp: 100 tablet, Rfl: 1   methocarbamol (ROBAXIN) 500 MG tablet, Take 1 tablet (500 mg total) by mouth every 8 (eight) hours as needed for muscle spasms., Disp: 30 tablet, Rfl: 1   naproxen (NAPROSYN) 500 MG tablet, Take 1 tablet (500 mg total) by mouth 2 (two) times daily with a meal., Disp: 60 tablet, Rfl: 11   omeprazole (PRILOSEC) 40 MG capsule, Take 1 capsule (40 mg total) by mouth 2 (two) times daily before a meal., Disp: 180 capsule, Rfl: 0  Allergies  Allergen Reactions   Avocado Hives, Shortness Of Breath and Swelling    and raw fruit and vegetables   Daucus [place]ota Hives, Shortness Of Breath and Swelling    (Carrots)   Other Hives, Shortness Of Breath and Swelling    Pears, avacados, nuts    I personally reviewed active  problem list, medication list, allergies, family history, social history, health maintenance with the patient/caregiver today.   ROS  ***  Objective  There were no vitals filed for this visit.  There is no height or weight on file to calculate BMI.  Physical Exam ***  Recent Results (from the past 2160 hour(s))  CBC with Differential     Status: None   Collection Time: 12/11/22 12:24 PM  Result Value Ref Range   WBC 6.3 4.0 - 10.5 K/uL   RBC 4.85 3.87 - 5.11 MIL/uL   Hemoglobin 13.7 12.0 - 15.0 g/dL   HCT 41.5 36.0 - 46.0 %   MCV 85.6 80.0 - 100.0 fL   MCH 28.2 26.0 - 34.0 pg   MCHC 33.0 30.0 - 36.0 g/dL   RDW 13.3 11.5 - 15.5 %   Platelets 266 150 - 400 K/uL  nRBC 0.0 0.0 - 0.2 %   Neutrophils Relative % 49 %   Neutro Abs 3.1 1.7 - 7.7 K/uL   Lymphocytes Relative 36 %   Lymphs Abs 2.3 0.7 - 4.0 K/uL   Monocytes Relative 7 %   Monocytes Absolute 0.5 0.1 - 1.0 K/uL   Eosinophils Relative 7 %   Eosinophils Absolute 0.4 0.0 - 0.5 K/uL   Basophils Relative 1 %   Basophils Absolute 0.0 0.0 - 0.1 K/uL   Immature Granulocytes 0 %   Abs Immature Granulocytes 0.02 0.00 - 0.07 K/uL    Comment: Performed at [facility], [address] [place], [place] [id number]  Basic metabolic panel     Status: Abnormal   Collection Time: 12/11/22 12:24 PM  Result Value Ref Range   Sodium 139 135 - 145 mmol/L   Potassium 3.8 3.5 - 5.1 mmol/L   Chloride 102 98 - 111 mmol/L   CO2 28 22 - 32 mmol/L   Glucose, Bld 109 (H) 70 - 99 mg/dL    Comment: Glucose reference range applies only to samples taken after fasting for at least 8 hours.   BUN 11 6 - 20 mg/dL   Creatinine, Ser 0.67 0.44 - 1.00 mg/dL   Calcium 9.2 8.9 - 10.3 mg/dL   GFR, Estimated >60 >60 mL/min    Comment: (NOTE) Calculated using the CKD-EPI Creatinine Equation (2021)    Anion gap 9 5 - 15    Comment: Performed at [facility], [address] [place], [place] [address]    PHQ2/9:    12/14/2022     9:35 AM 11/19/2022    8:05 AM 10/11/2022    3:10 PM 08/10/2022    8:22 AM 07/24/2022    8:09 AM  Depression screen PHQ 2/9  Decreased Interest 0 0 0 0 0  Down, Depressed, Hopeless 0 0 0 0 0  PHQ - 2 Score 0 0 0 0 0  Altered sleeping 0 0 0 0 0  Tired, decreased energy 0 0 0 0 0  Change in appetite 0 0 0 0 0  Feeling bad or failure about yourself  0 0 0 0 0  Trouble concentrating 0 0 0 0 0  Moving slowly or fidgety/restless 0 0 0 0 0  Suicidal thoughts 0 0 0 0 0  PHQ-9 Score 0 0 0 0 0  Difficult doing work/chores Not difficult at all  Not difficult at all      phq 9 is {gen pos [id number]   Fall Risk:    12/14/2022    9:35 AM 11/19/2022    8:05 AM 11/01/2022   11:03 AM 10/11/2022    3:10 PM 08/10/2022    8:22 AM  Fall Risk   Falls in the past year? 0 1 1 0 1  Number falls in past yr: 0 0 0 0 0  Injury with Fall? 0 0 0 0 1  Risk for fall due to : No Fall Risks No Fall Risks History of fall(s) No Fall Risks No Fall Risks  Follow up Falls prevention discussed;Education provided;Falls evaluation completed Falls prevention discussed Falls prevention discussed;Education provided;Falls evaluation completed Falls prevention discussed;Education provided;Falls evaluation completed Falls prevention discussed      Functional Status Survey:      Assessment & Plan  *** There are no diagnoses linked to this encounter.

## 2023-01-23 ENCOUNTER — Ambulatory Visit: Payer: 59 | Admitting: Family Medicine

## 2023-02-06 IMAGING — MG MM DIGITAL SCREENING BILAT W/ TOMO AND CAD
6 of 12 series · 6 of 36 positions shown · non-contrast
Comparison: Previous exam(s).

CLINICAL DATA: Screening.

EXAM:
DIGITAL SCREENING BILATERAL MAMMOGRAM WITH TOMOSYNTHESIS AND CAD
TECHNIQUE: Bilateral screening digital craniocaudal and mediolateral oblique
mammograms were obtained. Bilateral screening digital breast
tomosynthesis was performed. The images were evaluated with
computer-aided detection.

[L MLO synth-2D]
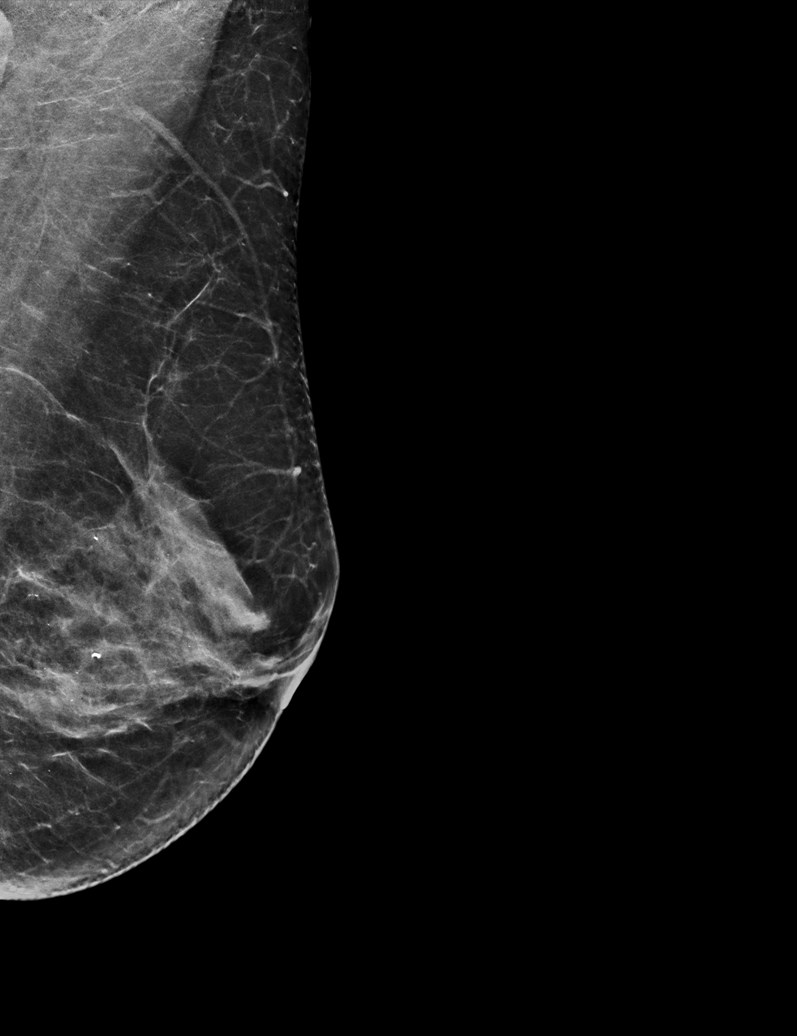

[R CC synth-2D (1 of 2)]
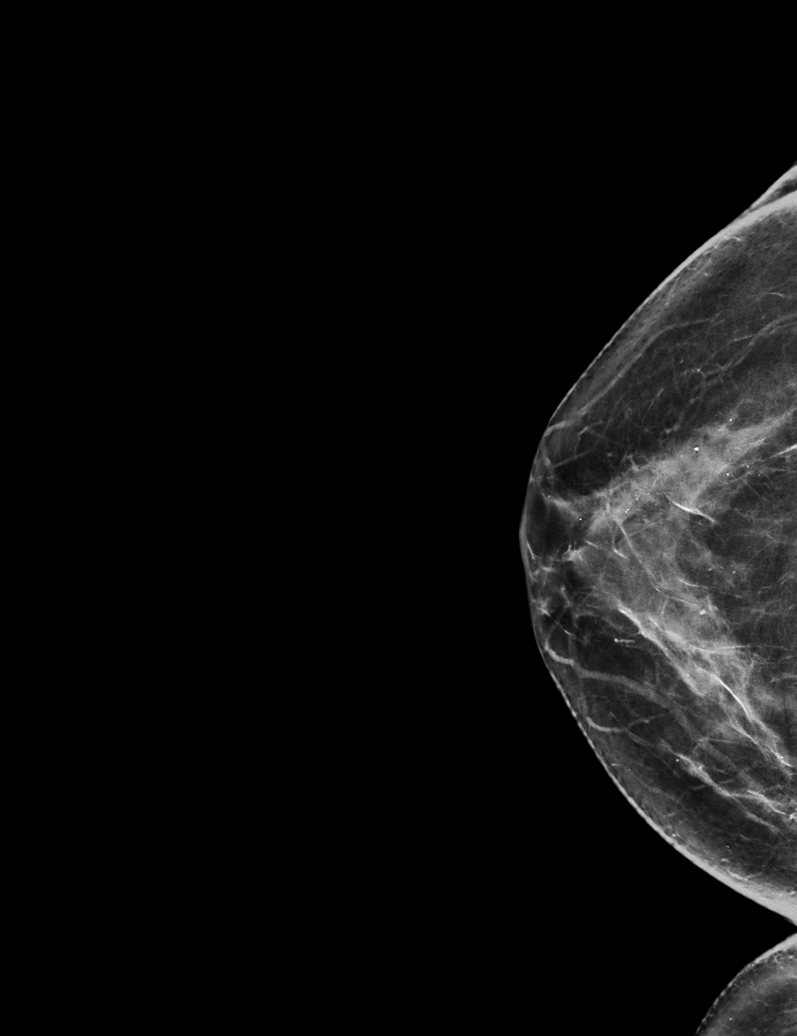

[L CC synth-2D (1 of 2)]
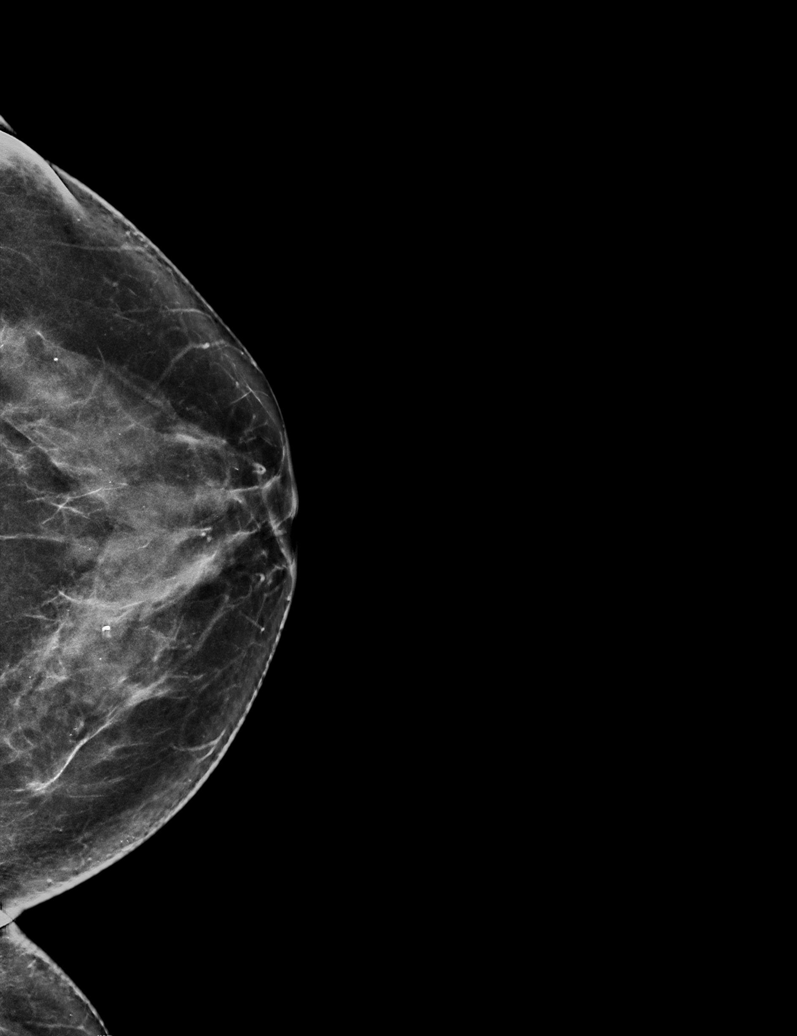

[L CC synth-2D (2 of 2)]
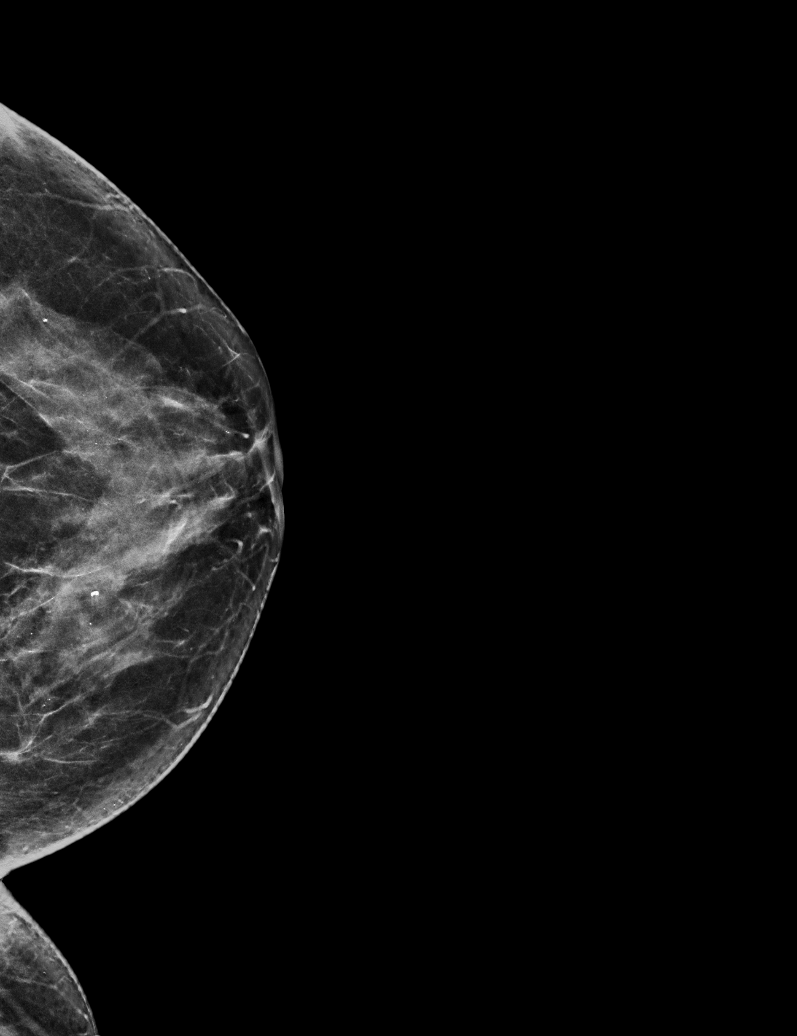

[R CC synth-2D (2 of 2)]
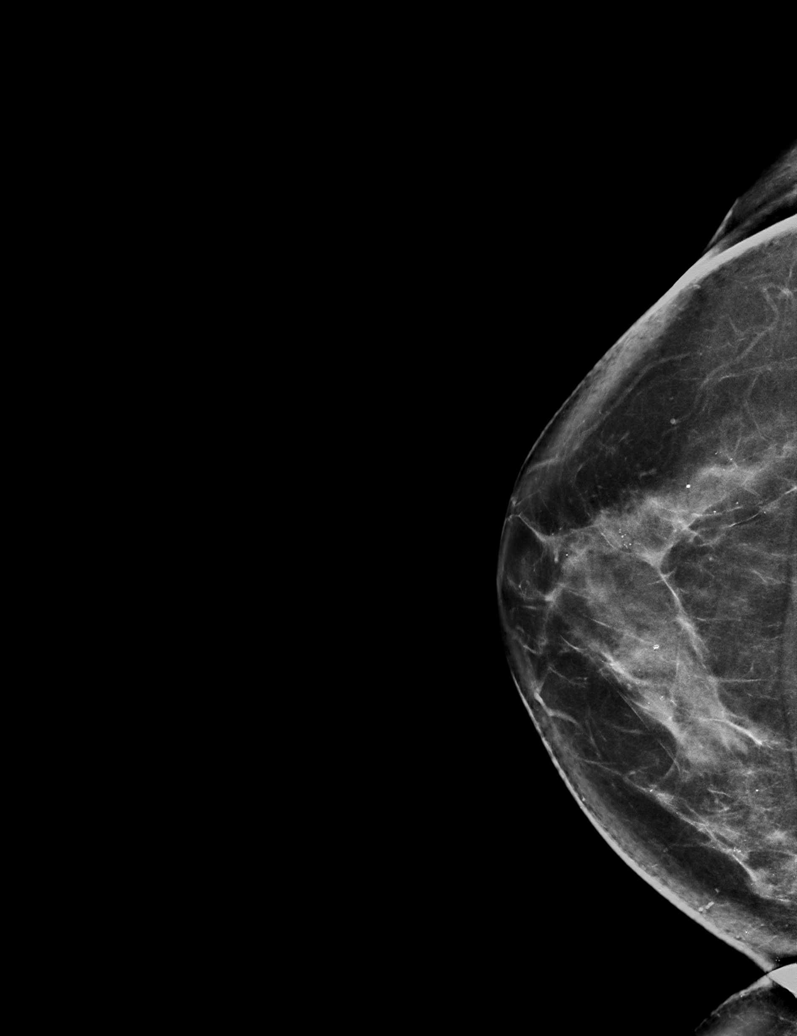

[R MLO synth-2D]
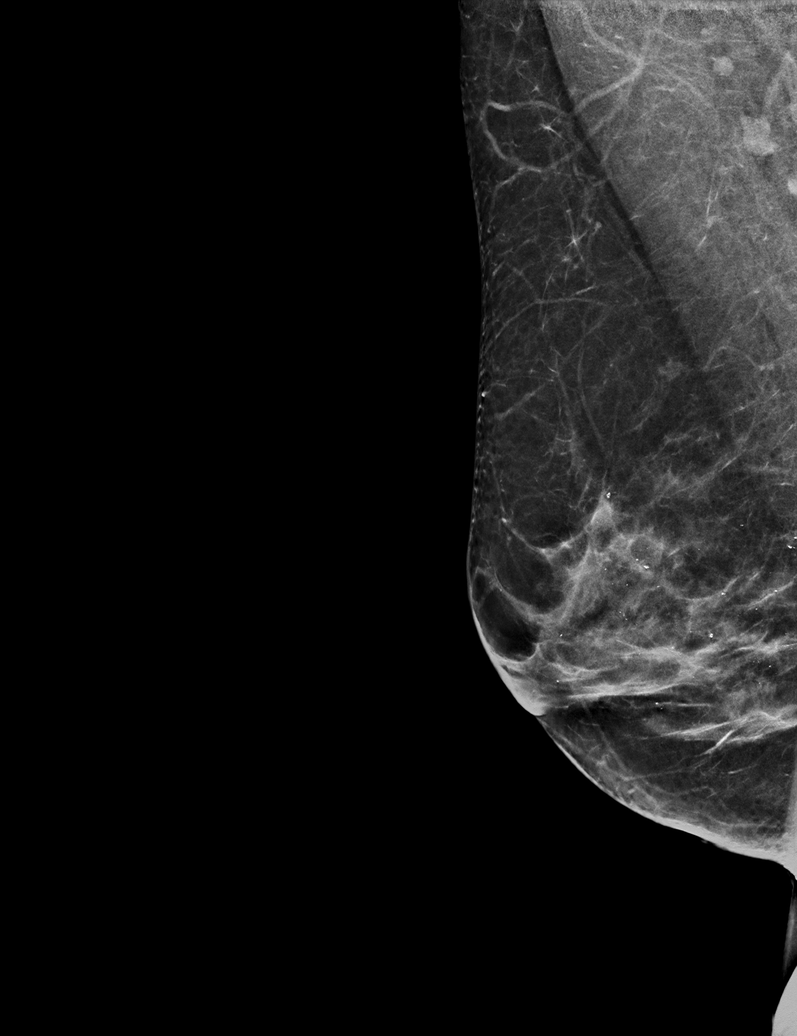

[6 of 36 positions shown; findings below may reference images not displayed]

ACR Breast Density Category c: The breast tissue is heterogeneously
dense, which may obscure small masses.
FINDINGS: There are no findings suspicious for malignancy. The images were
evaluated with computer-aided detection.
IMPRESSION: No mammographic evidence of malignancy. A result letter of this
screening mammogram will be mailed directly to the patient.

RECOMMENDATION:
Screening mammogram in one year. (Code:T4-5-GWO)

BI-RADS CATEGORY  1: Negative.

## 2023-04-10 ENCOUNTER — Ambulatory Visit: Payer: 59 | Attending: Physical Medicine and Rehabilitation | Admitting: Speech Pathology

## 2023-04-10 DIAGNOSIS — R41841 Cognitive communication deficit: Secondary | ICD-10-CM | POA: Insufficient documentation
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
## 2023-04-30 ENCOUNTER — Ambulatory Visit: Payer: 59 | Admitting: Speech Pathology

## 2023-05-02 ENCOUNTER — Encounter: Payer: 59 | Admitting: Speech Pathology

## 2023-05-06 ENCOUNTER — Encounter: Payer: 59 | Admitting: Speech Pathology

## 2023-05-13 DIAGNOSIS — H53149 Visual discomfort, unspecified: Secondary | ICD-10-CM | POA: Diagnosis not present

## 2023-05-13 DIAGNOSIS — F0781 Postconcussional syndrome: Secondary | ICD-10-CM | POA: Diagnosis not present

## 2023-05-13 DIAGNOSIS — F40298 Other specified phobia: Secondary | ICD-10-CM | POA: Diagnosis not present

## 2023-05-13 DIAGNOSIS — G479 Sleep disorder, unspecified: Secondary | ICD-10-CM | POA: Diagnosis not present

## 2023-05-13 DIAGNOSIS — M542 Cervicalgia: Secondary | ICD-10-CM | POA: Diagnosis not present

## 2023-05-13 DIAGNOSIS — R519 Headache, unspecified: Secondary | ICD-10-CM | POA: Diagnosis not present

## 2023-05-13 DIAGNOSIS — H538 Other visual disturbances: Secondary | ICD-10-CM | POA: Diagnosis not present

## 2023-05-20 ENCOUNTER — Encounter: Payer: 59 | Admitting: Speech Pathology

## 2023-05-22 ENCOUNTER — Encounter: Payer: 59 | Admitting: Speech Pathology

## 2023-05-28 DIAGNOSIS — G43719 Chronic migraine without aura, intractable, without status migrainosus: Secondary | ICD-10-CM | POA: Diagnosis not present

## 2023-05-28 DIAGNOSIS — F0781 Postconcussional syndrome: Secondary | ICD-10-CM | POA: Diagnosis not present

## 2023-06-17 DIAGNOSIS — F0781 Postconcussional syndrome: Secondary | ICD-10-CM | POA: Diagnosis not present

## 2023-06-17 DIAGNOSIS — R42 Dizziness and giddiness: Secondary | ICD-10-CM | POA: Diagnosis not present

## 2023-06-17 DIAGNOSIS — R2689 Other abnormalities of gait and mobility: Secondary | ICD-10-CM | POA: Diagnosis not present

## 2023-06-26 DIAGNOSIS — R2689 Other abnormalities of gait and mobility: Secondary | ICD-10-CM | POA: Diagnosis not present

## 2023-06-26 DIAGNOSIS — R42 Dizziness and giddiness: Secondary | ICD-10-CM | POA: Diagnosis not present

## 2023-06-26 DIAGNOSIS — F0781 Postconcussional syndrome: Secondary | ICD-10-CM | POA: Diagnosis not present

## 2023-07-03 DIAGNOSIS — R42 Dizziness and giddiness: Secondary | ICD-10-CM | POA: Diagnosis not present

## 2023-07-03 DIAGNOSIS — R2689 Other abnormalities of gait and mobility: Secondary | ICD-10-CM | POA: Diagnosis not present

## 2023-07-03 DIAGNOSIS — F0781 Postconcussional syndrome: Secondary | ICD-10-CM | POA: Diagnosis not present

## 2023-07-15 DIAGNOSIS — L7211 Pilar cyst: Secondary | ICD-10-CM | POA: Diagnosis not present

## 2023-07-15 DIAGNOSIS — Z85828 Personal history of other malignant neoplasm of skin: Secondary | ICD-10-CM | POA: Diagnosis not present

## 2023-07-15 DIAGNOSIS — D1801 Hemangioma of skin and subcutaneous tissue: Secondary | ICD-10-CM | POA: Diagnosis not present

## 2023-07-22 DIAGNOSIS — R2689 Other abnormalities of gait and mobility: Secondary | ICD-10-CM | POA: Diagnosis not present

## 2023-07-22 DIAGNOSIS — F0781 Postconcussional syndrome: Secondary | ICD-10-CM | POA: Diagnosis not present

## 2023-07-22 DIAGNOSIS — R42 Dizziness and giddiness: Secondary | ICD-10-CM | POA: Diagnosis not present

## 2023-08-15 NOTE — Progress Notes (Deleted)
Name: Kyndal Corra   MRN: [id number]    DOB: 29-Sep-1966   Date:08/15/2023       Progress Note  Subjective  Chief Complaint  Annual Exam  HPI  Patient presents for annual CPE.  Diet: *** Exercise: ***  Last Eye Exam: *** Last Dental Exam: ***  Flowsheet Row Office Visit from 08/08/2021 in [facility]  AUDIT-C Score 0      Depression: Phq 9 is  {Desc; negative/positive:13464}    12/14/2022    9:35 AM 11/19/2022    8:05 AM 10/11/2022    3:10 PM 08/10/2022    8:22 AM 07/24/2022    8:09 AM  Depression screen PHQ 2/9  Decreased Interest 0 0 0 0 0  Down, Depressed, Hopeless 0 0 0 0 0  PHQ - 2 Score 0 0 0 0 0  Altered sleeping 0 0 0 0 0  Tired, decreased energy 0 0 0 0 0  Change in appetite 0 0 0 0 0  Feeling bad or failure about yourself  0 0 0 0 0  Trouble concentrating 0 0 0 0 0  Moving slowly or fidgety/restless 0 0 0 0 0  Suicidal thoughts 0 0 0 0 0  PHQ-9 Score 0 0 0 0 0  Difficult doing work/chores Not difficult at all  Not difficult at all     Hypertension: BP Readings from Last 3 Encounters:  12/14/22 124/72  12/11/22 110/79  11/19/22 122/70   Obesity: Wt Readings from Last 3 Encounters:  12/14/22 146 lb 14.4 oz (66.6 kg)  12/11/22 145 lb (65.8 kg)  11/19/22 146 lb (66.2 kg)   BMI Readings from Last 3 Encounters:  12/14/22 26.02 kg/m  12/11/22 25.69 kg/m  11/19/22 25.86 kg/m     Vaccines:   HPV: N/A Tdap: Due in Nov 2024 Shingrix: N/A Pneumonia: N/A Flu: due COVID-19: up to date   Hep C Screening: 03/24/20 STD testing and prevention (HIV/chl/gon/syphilis): N/A Intimate partner violence: negative screen  Sexual History : Menstrual History/LMP/Abnormal Bleeding:  Discussed importance of follow up if any post-menopausal bleeding: yes  Incontinence Symptoms: negative for symptoms   Breast cancer:  - Last Mammogram: 10/09/22 - BRCA gene screening: N/A  Osteoporosis Prevention : Discussed high calcium and  vitamin D supplementation, weight bearing exercises Bone density: N/A   Cervical cancer screening: 03/24/20  Skin cancer: Discussed monitoring for atypical lesions  Colorectal cancer: 08/30/22   Lung cancer:  Low Dose CT Chest recommended if Age 74-80 years, 20 pack-year currently smoking OR have quit w/in 15years. Patient does not qualify for screen   ECG: 12/19/20  Advanced Care Planning: A voluntary discussion about advance care planning including the explanation and discussion of advance directives.  Discussed health care proxy and Living will, and the patient was able to identify a health care proxy as ***.  Patient does not have a living will and power of attorney of health care   Lipids: Lab Results  Component Value Date   CHOL 213 (H) 08/10/2022   CHOL 224 (H) 08/08/2021   CHOL 220 (H) 03/24/2020   Lab Results  Component Value Date   HDL 74 08/10/2022   HDL 73 08/08/2021   HDL 72 03/24/2020   Lab Results  Component Value Date   LDLCALC 116 (H) 08/10/2022   LDLCALC 129 (H) 08/08/2021   LDLCALC 126 (H) 03/24/2020   Lab Results  Component Value Date   TRIG 118 08/10/2022   TRIG 110 08/08/2021  TRIG 109 03/24/2020   Lab Results  Component Value Date   CHOLHDL 2.9 08/10/2022   CHOLHDL 3.1 08/08/2021   CHOLHDL 3.1 03/24/2020   No results found for: "LDLDIRECT"  Glucose: Glucose, Bld  Date Value Ref Range Status  12/11/2022 109 (H) 70 - 99 mg/dL Final    Comment:    Glucose reference range applies only to samples taken after fasting for at least 8 hours.  08/10/2022 95 65 - 99 mg/dL Final    Comment:    .            Fasting reference interval .   08/08/2021 90 65 - 99 mg/dL Final    Comment:    .            Fasting reference interval .    Glucose-Capillary  Date Value Ref Range Status  12/16/2020 71 70 - 99 mg/dL Final    Comment:    Glucose reference range applies only to samples taken after fasting for at least 8 hours.    Patient Active Problem  List   Diagnosis Date Noted   History of colonic polyps 08/30/2022   Chronic GERD 08/30/2022   Gastroesophageal reflux disease 08/30/2022   Lateral epicondylitis of both elbows 07/24/2022   Arthralgia of both hands 07/24/2022   Hot flashes due to menopause 01/05/2022   Anxiety 01/05/2022   Reactive airway disease 07/05/2020   Insomnia, persistent 05/03/2015   Functional dyspepsia 05/03/2015   Gastro-esophageal reflux disease without esophagitis 05/03/2015   Perennial allergic rhinitis with seasonal variation 05/03/2015   Arthritis of temporomandibular joint 05/03/2015   Vitamin D deficiency 05/03/2015   Irritable bowel syndrome with both constipation and diarrhea 05/03/2015    Past Surgical History:  Procedure Laterality Date   ABDOMINAL HYSTERECTOMY     BRAVO PH STUDY N/A 08/30/2022   Procedure: BRAVO PH STUDY;  Surgeon: Toney Reil, MD;  Location: [facility] ENDOSCOPY;  Service: Gastroenterology;  Laterality: N/A;  on ppi   COLONOSCOPY WITH PROPOFOL N/A 09/27/2017   Procedure: COLONOSCOPY WITH PROPOFOL;  Surgeon: Wyline Mood, MD;  Location: [facility] ENDOSCOPY;  Service: Gastroenterology;  Laterality: N/A;   COLONOSCOPY WITH PROPOFOL N/A 08/30/2022   Procedure: COLONOSCOPY WITH PROPOFOL;  Surgeon: Toney Reil, MD;  Location: [facility] ENDOSCOPY;  Service: Gastroenterology;  Laterality: N/A;   ESOPHAGOGASTRODUODENOSCOPY N/A 08/30/2022   Procedure: ESOPHAGOGASTRODUODENOSCOPY (EGD);  Surgeon: Toney Reil, MD;  Location: [facility] ENDOSCOPY;  Service: Gastroenterology;  Laterality: N/A;   ESOPHAGOGASTRODUODENOSCOPY (EGD) WITH PROPOFOL N/A 09/27/2017   Procedure: ESOPHAGOGASTRODUODENOSCOPY (EGD) WITH PROPOFOL;  Surgeon: Wyline Mood, MD;  Location: [facility] ENDOSCOPY;  Service: Gastroenterology;  Laterality: N/A;   TUBAL LIGATION      Family History  Problem Relation Age of Onset   Diabetes Mother    Hyperlipidemia Mother    Hypertension Mother    Liver disease Father    Cancer Brother  74       brain cancer   Breast cancer Neg Hx     Social History   Socioeconomic History   Marital status: Married    Spouse name: Gerilyn Nestle   Number of children: 2   Years of education: Not on file   Highest education level: Associate degree: occupational, Scientist, product/process development, or vocational program  Occupational History   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      Right handed    Two story home   Social Determinants of Health   Financial Resource Strain: Low Risk  (08/10/2022)   Overall Financial Resource Strain (CARDIA)    Difficulty of Paying Living Expenses: Not hard at all  Food Insecurity: No Food Insecurity (08/10/2022)   Hunger Vital Sign    Worried About Running Out of Food in the Last Year: Never true    Ran Out of Food in the Last Year: Never true  Transportation Needs: No Transportation Needs (08/10/2022)   PRAPARE - Administrator, Civil Service (Medical): No    Lack of Transportation (Non-Medical): No  Physical Activity: Insufficiently Active (08/10/2022)   Exercise Vital Sign    Days of Exercise per Week: 3 days    Minutes of Exercise per Session: 30 min  Stress: No Stress Concern Present (08/10/2022)   [employer] of Occupational Health - Occupational Stress Questionnaire    Feeling of Stress : Not at all  Social Connections: Socially Integrated (08/10/2022)   Social Connection and Isolation Panel [NHANES]    Frequency of Communication  with Friends and Family: More than three times a week    Frequency of Social Gatherings with Friends and Family: Once a week    Attends Religious Services: More than 4 times per year    Active Member of [employer] or Organizations: Yes    Attends Engineer, structural: More than 4 times per year    Marital Status: Married  Catering manager Violence: Not At Risk (08/10/2022)   Humiliation, Afraid, Rape, and Kick questionnaire    Fear of Current or Ex-Partner: No    Emotionally Abused: No    Physically Abused: No    Sexually Abused: No     Current Outpatient Medications:    albuterol (VENTOLIN HFA) 108 (90 Base) MCG/ACT inhaler, Inhale 2 puffs into the lungs every 6 (six) hours as needed for wheezing or shortness of breath., Disp: 18 g, Rfl: 1   amitriptyline (ELAVIL) 25 MG tablet, Take 1 tablet (25 mg total) by mouth at bedtime. For post--traumatic headache, Disp: 90 tablet, Rfl: 0   DULoxetine (CYMBALTA) 30 MG capsule, Take 1 capsule (30 mg total) by mouth daily., Disp: 90 capsule, Rfl: 1   fexofenadine (ALLEGRA ALLERGY) 180 MG tablet, Take 1 tablet (180 mg total) by mouth as needed., Disp: 30 tablet, Rfl: 5   Fluticasone-Umeclidin-Vilant 100-62.5-25 MCG/ACT AEPB, Inhale 1 each into the lungs daily at 12 noon., Disp: 60 each, Rfl: 2   hyoscyamine (LEVSIN) 0.125 MG tablet, Take 1 tablet (0.125 mg total) by mouth every 4 (four) hours as needed., Disp: 100 tablet, Rfl: 1   methocarbamol (ROBAXIN) 500 MG tablet, Take 1 tablet (500 mg total) by mouth every 8 (eight) hours as needed for muscle spasms., Disp: 30 tablet, Rfl: 1   naproxen (NAPROSYN) 500 MG tablet, Take 1 tablet (500 mg total) by mouth 2 (two) times daily with a meal., Disp: 60 tablet, Rfl: 11   omeprazole (PRILOSEC) 40 MG capsule,  Take 1 capsule (40 mg total) by mouth 2 (two) times daily before a meal., Disp: 180 capsule, Rfl: 0  Allergies  Allergen Reactions   Avocado Hives, Shortness Of Breath and Swelling    and raw fruit  and vegetables   Daucus Carota Hives, Shortness Of Breath and Swelling    (Carrots)   Other Hives, Shortness Of Breath and Swelling    Pears, avacados, nuts     ROS  ***  Objective  There were no vitals filed for this visit.  There is no height or weight on file to calculate BMI.  Physical Exam ***  No results found for this or any previous visit (from the past 2160 hour(s)).   Fall Risk:    12/14/2022    9:35 AM 11/19/2022    8:05 AM 11/01/2022   11:03 AM 10/11/2022    3:10 PM 08/10/2022    8:22 AM  Fall Risk   Falls in the past year? 0 1 1 0 1  Number falls in past yr: 0 0 0 0 0  Injury with Fall? 0 0 0 0 1  Risk for fall due to : No Fall Risks No Fall Risks History of fall(s) No Fall Risks No Fall Risks  Follow up Falls prevention discussed;Education provided;Falls evaluation completed Falls prevention discussed Falls prevention discussed;Education provided;Falls evaluation completed Falls prevention discussed;Education provided;Falls evaluation completed Falls prevention discussed     Functional Status Survey:     Assessment & Plan  1. Well adult exam ***    -USPSTF grade A and B recommendations reviewed with patient; age-appropriate recommendations, preventive care, screening tests, etc discussed and encouraged; healthy living encouraged; see AVS for patient education given to patient -Discussed importance of 150 minutes of physical activity weekly, eat two servings of fish weekly, eat one serving of tree nuts ( cashews, pistachios, pecans, almonds.Marland Kitchen) every other day, eat 6 servings of fruit/vegetables daily and drink plenty of water and avoid sweet beverages.   -Reviewed Health Maintenance: Yes.

## 2023-08-16 ENCOUNTER — Encounter: Payer: 59 | Admitting: Family Medicine

## 2023-08-16 DIAGNOSIS — Z23 Encounter for immunization: Secondary | ICD-10-CM

## 2023-08-16 DIAGNOSIS — Z0000 Encounter for general adult medical examination without abnormal findings: Secondary | ICD-10-CM

## 2023-08-30 DIAGNOSIS — G43709 Chronic migraine without aura, not intractable, without status migrainosus: Secondary | ICD-10-CM | POA: Diagnosis not present

## 2023-08-30 DIAGNOSIS — G44329 Chronic post-traumatic headache, not intractable: Secondary | ICD-10-CM | POA: Diagnosis not present

## 2023-09-11 ENCOUNTER — Ambulatory Visit: Payer: 59 | Admitting: Nurse Practitioner

## 2023-09-12 ENCOUNTER — Ambulatory Visit: Payer: 59 | Admitting: Nurse Practitioner

## 2023-09-12 ENCOUNTER — Other Ambulatory Visit: Payer: Self-pay

## 2023-09-12 ENCOUNTER — Encounter: Payer: Self-pay | Admitting: Nurse Practitioner

## 2023-09-12 DIAGNOSIS — J4521 Mild intermittent asthma with (acute) exacerbation: Secondary | ICD-10-CM

## 2023-09-12 DIAGNOSIS — J4520 Mild intermittent asthma, uncomplicated: Secondary | ICD-10-CM | POA: Diagnosis not present

## 2023-09-12 DIAGNOSIS — N951 Menopausal and female climacteric states: Secondary | ICD-10-CM

## 2023-09-12 MED ORDER — FLUTICASONE-UMECLIDIN-VILANT 100-62.5-25 MCG/ACT IN AEPB: 1.0000 | INHALATION_SPRAY | Freq: Every day | RESPIRATORY_TRACT | 2 refills | Status: DC

## 2023-09-12 MED ORDER — ALBUTEROL SULFATE HFA 108 (90 BASE) MCG/ACT IN AERS: 2.0000 | INHALATION_SPRAY | Freq: Four times a day (QID) | RESPIRATORY_TRACT | 2 refills | Status: DC | PRN

## 2023-09-12 MED ORDER — DULOXETINE HCL 30 MG PO CPEP: 30.0000 mg | ORAL_CAPSULE | Freq: Every day | ORAL | 1 refills | Status: DC

## 2023-09-12 NOTE — Progress Notes (Signed)
BP 120/78   Pulse 98   Temp 98 F (36.7 C) (Oral)   Resp 16   Ht 5\' 3"  (1.6 m)   Wt 140 lb 8 oz (63.7 kg)   SpO2 97%   BMI 24.89 kg/m    Subjective:    Patient ID: Helen Green, female    DOB: [birth year], 57 y.o.   MRN: [id number]  HPI: [name] is a 57 y.o. female  Chief Complaint  Patient presents with   Cough    With SOB for 3 weeks   Discussed the use of AI scribe software for clinical note transcription with the patient, who gave verbal consent to proceed.  History of Present Illness   The patient, with a history of asthma and chronic headaches, presents after running out of their asthma medications, Trelegy and albuterol. They report a recent increase in coughing and mild shortness of breath, but deny fever or cold symptoms. They have not been using their asthma medications for a while as they have been feeling better. They also report a history of concussions since December of the previous year and have been on strong medication for this.  In addition to their asthma, they have been suffering from chronic headaches and have recently started a new medication, Aimovig, under the care of neurology. This was their first time trying Aimovig and they report a severe nightmare following the injection, which was administered the previous evening. They are unsure if they want to continue with this medication due to the side effect.  The patient also reports suffering from night sweats related to menopause and has previously been on Cymbalta (duloxetine) for this, which they have not been taking recently. They are unsure if this medication will interfere with their other medications.   Relevant past medical, surgical, family and social history reviewed and updated as indicated. Interim medical history since our last visit reviewed. Allergies and medications reviewed and updated.  Review of Systems  Ten systems reviewed and is negative except as mentioned in HPI        Objective:    BP 120/78   Pulse 98   Temp 98 F (36.7 C) (Oral)   Resp 16   Ht 5\' 3"  (1.6 m)   Wt 140 lb 8 oz (63.7 kg)   SpO2 97%   BMI 24.89 kg/m   Wt Readings from Last 3 Encounters:  09/12/23 140 lb 8 oz (63.7 kg)  12/14/22 146 lb 14.4 oz (66.6 kg)  12/11/22 145 lb (65.8 kg)    Physical Exam  Constitutional: Patient appears well-developed and well-nourished.  No distress.  HEENT: head atraumatic, normocephalic, pupils equal and reactive to light, neck supple Cardiovascular: Normal rate, regular rhythm and normal heart sounds.  No murmur heard. No BLE edema. Pulmonary/Chest: Effort normal and breath sounds mild scattered wheezing No respiratory distress. Abdominal: Soft.  There is no tenderness. Psychiatric: Patient has a normal mood and affect. behavior is normal. Judgment and thought content normal.  Results for orders placed or performed during the hospital encounter of 12/11/22  CBC with Differential  Result Value Ref Range   WBC 6.3 4.0 - 10.5 K/uL   RBC 4.85 3.87 - 5.11 MIL/uL   Hemoglobin 13.7 12.0 - 15.0 g/dL   HCT 78.2 95.6 - 21.3 %   MCV 85.6 80.0 - 100.0 fL   MCH 28.2 26.0 - 34.0 pg   MCHC 33.0 30.0 - 36.0 g/dL   RDW 08.6 57.8 - 46.9 %  Platelets 266 150 - 400 K/uL   nRBC 0.0 0.0 - 0.2 %   Neutrophils Relative % 49 %   Neutro Abs 3.1 1.7 - 7.7 K/uL   Lymphocytes Relative 36 %   Lymphs Abs 2.3 0.7 - 4.0 K/uL   Monocytes Relative 7 %   Monocytes Absolute 0.5 0.1 - 1.0 K/uL   Eosinophils Relative 7 %   Eosinophils Absolute 0.4 0.0 - 0.5 K/uL   Basophils Relative 1 %   Basophils Absolute 0.0 0.0 - 0.1 K/uL   Immature Granulocytes 0 %   Abs Immature Granulocytes 0.02 0.00 - 0.07 K/uL  Basic metabolic panel  Result Value Ref Range   Sodium 139 135 - 145 mmol/L   Potassium 3.8 3.5 - 5.1 mmol/L   Chloride 102 98 - 111 mmol/L   CO2 28 22 - 32 mmol/L   Glucose, Bld 109 (H) 70 - 99 mg/dL   BUN 11 6 - 20 mg/dL   Creatinine, Ser 1.19 0.44 - 1.00  mg/dL   Calcium 9.2 8.9 - 14.7 mg/dL   GFR, Estimated >82 >95 mL/min   Anion gap 9 5 - 15      Assessment & Plan:   Problem List Items Addressed This Visit       Cardiovascular and Mediastinum   Hot flashes due to menopause   Relevant Medications   DULoxetine (CYMBALTA) 30 MG capsule     Respiratory   Reactive airway disease   Relevant Medications   Fluticasone-Umeclidin-Vilant 100-62.5-25 MCG/ACT AEPB   albuterol (VENTOLIN HFA) 108 (90 Base) MCG/ACT inhaler    Assessment and Plan    Asthma Reports coughing and shortness of breath. Auscultation reveals wheezing, but no congestion. No recent illness or fever. Patient has been off Trelegy and Albuterol. -Refill Trelegy and Albuterol prescriptions to [facility] on [address].  Migraines Reports severe headaches and recent initiation of Aimovig with subsequent nightmares. Under the care of neurology. -No changes to current management plan.  Menopausal symptoms Reports night sweats. Previously on Cymbalta (Duloxetine) 30mg , has not been taking it lately -Resume Cymbalta (Duloxetine) 30mg  for management of menopausal symptoms.  General Health Maintenance -Deferred flu shot due to recent initiation of Aimovig. Patient may return for flu shot at a later date.       Sample of trelegy also provided Follow up plan: Return if symptoms worsen or fail to improve.

## 2023-09-30 ENCOUNTER — Ambulatory Visit: Payer: 59 | Admitting: Physician Assistant

## 2023-09-30 ENCOUNTER — Ambulatory Visit: Payer: Self-pay

## 2023-09-30 NOTE — Telephone Encounter (Signed)
  Chief Complaint: worsening cough, chest pain Symptoms: chest congestion, SOB at rest and exertion, productive cough, runny nose Pertinent Negatives: Patient denies fever Disposition: [] ED /[] Urgent Care (no appt availability in office) / [x] Appointment(In office/virtual)/ []  [facility]/ [] Home Care/ [] Refused Recommended Disposition /[] [facility]/ []  Follow-up with PCP Additional Notes:  Reason for Disposition  [1] MILD difficulty breathing (e.g., minimal/no SOB at rest, SOB with walking, pulse <100) AND [2] NEW-onset or WORSE than normal  Answer Assessment - Initial Assessment Questions 1. RESPIRATORY STATUS: "Describe your breathing?" (e.g., wheezing, shortness of breath, unable to speak, severe coughing)      Wheezing, SOB 2. ONSET: "When did this breathing problem begin?"      Worsened  3. PATTERN "Does the difficult breathing come and go, or has it been constant since it started?"      Comes moderate 4. SEVERITY: "How bad is your breathing?" (e.g., mild, moderate, severe)    - MILD: No SOB at rest, mild SOB with walking, speaks normally in sentences, can lie down, no retractions, pulse < 100.    - MODERATE: SOB at rest, SOB with minimal exertion and prefers to sit, cannot lie down flat, speaks in phrases, mild retractions, audible wheezing, pulse 100-120.    - SEVERE: Very SOB at rest, speaks in single words, struggling to breathe, sitting hunched forward, retractions, pulse > 120      moderate 5. RECURRENT SYMPTOM: "Have you had difficulty breathing before?" If Yes, ask: "When was the last time?" and "What happened that time?"      Yes 1 month ago  6. CARDIAC HISTORY: "Do you have any history of heart disease?" (e.g., heart attack, angina, bypass surgery, angioplasty)      Flu like sx weeks back  7. LUNG HISTORY: "Do you have any history of lung disease?"  (e.g., pulmonary embolus, asthma, emphysema)     no 8. CAUSE: "What do you think is causing the  breathing problem?"      virus 9. OTHER SYMPTOMS: "Do you have any other symptoms? (e.g., dizziness, runny nose, cough, chest pain, fever)     Cough, chest pain , SOB on and off, chest congestion, runny nose// chest pain: no radiating//hurts around ribs  Protocols used: Breathing Difficulty-A-AH

## 2023-10-01 ENCOUNTER — Encounter: Payer: Self-pay | Admitting: Physician Assistant

## 2023-10-01 ENCOUNTER — Ambulatory Visit: Payer: 59 | Admitting: Physician Assistant

## 2023-10-01 VITALS — BP 122/72 | HR 82 | Temp 98.0°F | Resp 16 | Ht 63.0 in | Wt 138.0 lb

## 2023-10-01 DIAGNOSIS — J4521 Mild intermittent asthma with (acute) exacerbation: Secondary | ICD-10-CM | POA: Diagnosis not present

## 2023-10-01 MED ORDER — PREDNISONE 20 MG PO TABS: ORAL_TABLET | ORAL | 0 refills | Status: DC

## 2023-10-01 NOTE — Progress Notes (Unsigned)
Name: Helen Green   MRN: [id number]    DOB: 22-Jul-1966   Date:10/02/2023       Progress Note  Subjective  Chief Complaint  Chief Complaint  Patient presents with   Annual Exam    HPI  Patient presents for annual CPE.  Diet: balanced diet  Exercise: discussed 150 minutes   Last Eye Exam: up to date  Last Dental Exam: up to date   [employer] Visit from 09/12/2023 in [facility]  AUDIT-C Score 0      Depression: Phq 9 is  negative    10/01/2023    1:16 PM 09/12/2023   10:06 AM 12/14/2022    9:35 AM 11/19/2022    8:05 AM 10/11/2022    3:10 PM  Depression screen PHQ 2/9  Decreased Interest 0 0 0 0 0  Down, Depressed, Hopeless 0 0 0 0 0  PHQ - 2 Score 0 0 0 0 0  Altered sleeping   0 0 0  Tired, decreased energy   0 0 0  Change in appetite   0 0 0  Feeling bad or failure about yourself    0 0 0  Trouble concentrating   0 0 0  Moving slowly or fidgety/restless   0 0 0  Suicidal thoughts   0 0 0  PHQ-9 Score   0 0 0  Difficult doing work/chores   Not difficult at all  Not difficult at all   Hypertension: BP Readings from Last 3 Encounters:  10/02/23 116/72  10/01/23 122/72  09/12/23 120/78   Obesity: Wt Readings from Last 3 Encounters:  10/02/23 141 lb 6.4 oz (64.1 kg)  10/01/23 138 lb (62.6 kg)  09/12/23 140 lb 8 oz (63.7 kg)   BMI Readings from Last 3 Encounters:  10/02/23 25.05 kg/m  10/01/23 24.45 kg/m  09/12/23 24.89 kg/m     Vaccines:   HPV: Aged out Tdap: she wants to return for that  Shingrix: due, discussed with patient  Pneumonia: patient is taking prednisone and will return  Flu: she will return for shots  COVID-19: 2 doses   Hep C Screening: Complete STD testing and prevention (HIV/chl/gon/syphilis): HIV not completed, ordered Intimate partner violence: negative screen  Sexual History : no pain  Menstrual History/LMP/Abnormal Bleeding: s/p hysterectomy but it was supra cervical and we will  repeat pap smear in 2026  Discussed importance of follow up if any post-menopausal bleeding: yes  Incontinence Symptoms: negative for symptoms   Breast cancer:  - Last Mammogram: 10/09/2022, due/ordered - BRCA gene screening: N/A  Osteoporosis Prevention : Discussed high calcium and vitamin D supplementation, weight bearing exercises Bone density :not applicable   Cervical cancer screening: 03/24/2020  Skin cancer: Discussed monitoring for atypical lesions  Colorectal cancer: Colonoscopy 08/30/2022   Lung cancer:  Low Dose CT Chest recommended if Age 4-80 years, 20 pack-year currently smoking OR have quit w/in 15years. Patient does not qualify for screen   ECG: 12/19/2020  Advanced Care Planning: A voluntary discussion about advance care planning including the explanation and discussion of advance directives.  Discussed health care proxy and Living will, and the patient was able to identify a health care proxy as husband .  Patient does not have a living will and power of attorney of health care   Lipids: Lab Results  Component Value Date   CHOL 213 (H) 08/10/2022   CHOL 224 (H) 08/08/2021   CHOL 220 (H) 03/24/2020  Lab Results  Component Value Date   HDL 74 08/10/2022   HDL 73 08/08/2021   HDL 72 03/24/2020   Lab Results  Component Value Date   LDLCALC 116 (H) 08/10/2022   LDLCALC 129 (H) 08/08/2021   LDLCALC 126 (H) 03/24/2020   Lab Results  Component Value Date   TRIG 118 08/10/2022   TRIG 110 08/08/2021   TRIG 109 03/24/2020   Lab Results  Component Value Date   CHOLHDL 2.9 08/10/2022   CHOLHDL 3.1 08/08/2021   CHOLHDL 3.1 03/24/2020   No results found for: "LDLDIRECT"  Glucose: Glucose, Bld  Date Value Ref Range Status  12/11/2022 109 (H) 70 - 99 mg/dL Final    Comment:    Glucose reference range applies only to samples taken after fasting for at least 8 hours.  08/10/2022 95 65 - 99 mg/dL Final    Comment:    .            Fasting reference  interval .   08/08/2021 90 65 - 99 mg/dL Final    Comment:    .            Fasting reference interval .    Glucose-Capillary  Date Value Ref Range Status  12/16/2020 71 70 - 99 mg/dL Final    Comment:    Glucose reference range applies only to samples taken after fasting for at least 8 hours.    Patient Active Problem List   Diagnosis Date Noted   History of colonic polyps 08/30/2022   Chronic GERD 08/30/2022   Gastroesophageal reflux disease 08/30/2022   Lateral epicondylitis of both elbows 07/24/2022   Arthralgia of both hands 07/24/2022   Hot flashes due to menopause 01/05/2022   Anxiety 01/05/2022   Reactive airway disease 07/05/2020   Insomnia, persistent 05/03/2015   Functional dyspepsia 05/03/2015   Gastro-esophageal reflux disease without esophagitis 05/03/2015   Perennial allergic rhinitis with seasonal variation 05/03/2015   Arthritis of temporomandibular joint 05/03/2015   Vitamin D deficiency 05/03/2015   Irritable bowel syndrome with both constipation and diarrhea 05/03/2015    Past Surgical History:  Procedure Laterality Date   ABDOMINAL HYSTERECTOMY     BRAVO PH STUDY N/A 08/30/2022   Procedure: BRAVO PH STUDY;  Surgeon: Toney Reil, MD;  Location: [facility] ENDOSCOPY;  Service: Gastroenterology;  Laterality: N/A;  on ppi   COLONOSCOPY WITH PROPOFOL N/A 09/27/2017   Procedure: COLONOSCOPY WITH PROPOFOL;  Surgeon: Wyline Mood, MD;  Location[facility] ENDOSCOPY;  Service: Gastroenterology;  Laterality: N/A;   COLONOSCOPY WITH PROPOFOL N/A 08/30/2022   Procedure: COLONOSCOPY WITH PROPOFOL;  Surgeon: Toney Reil, MD;  Location: [facility] ENDOSCOPY;  Service: Gastroenterology;  Laterality: N/A;   ESOPHAGOGASTRODUODENOSCOPY N/A 08/30/2022   Procedure: ESOPHAGOGASTRODUODENOSCOPY (EGD);  Surgeon: Toney Reil, MD;  Location: [facility] ENDOSCOPY;  Service: Gastroenterology;  Laterality: N/A;   ESOPHAGOGASTRODUODENOSCOPY (EGD) WITH PROPOFOL N/A 09/27/2017    Procedure: ESOPHAGOGASTRODUODENOSCOPY (EGD) WITH PROPOFOL;  Surgeon: Wyline Mood, MD;  Location: [facility] ENDOSCOPY;  Service: Gastroenterology;  Laterality: N/A;   TUBAL LIGATION      Family History  Problem Relation Age of Onset   Diabetes Mother    Hyperlipidemia Mother    Hypertension Mother    Liver disease Father    Cancer Brother 14       brain cancer   Breast cancer Neg Hx     Social History   Socioeconomic History   Marital status: Married    Spouse name: Gerilyn Nestle  Number of children: 2   Years of education: Not on file   Highest education level: Associate degree: occupational, Scientist, product/process development, or vocational program  Occupational History   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      Right handed    Two story home   Social Determinants of Health   Financial Resource Strain: Low Risk  (08/10/2022)   Overall Financial Resource Strain (CARDIA)    Difficulty of Paying Living Expenses: Not hard at all  Food Insecurity: No Food Insecurity (08/10/2022)   Hunger Vital Sign    Worried About Running Out of Food in the Last Year: Never true    Ran Out of Food in the Last Year: Never true  Transportation Needs: No Transportation Needs (08/10/2022)   PRAPARE - Administrator, Civil Service (Medical): No    Lack of Transportation (Non-Medical): No  Physical Activity: Insufficiently Active (08/10/2022)   Exercise Vital Sign    Days of Exercise per Week: 3 days     Minutes of Exercise per Session: 30 min  Stress: No Stress Concern Present (08/10/2022)   [employer] of Occupational Health - Occupational Stress Questionnaire    Feeling of Stress : Not at all  Social Connections: Socially Integrated (08/10/2022)   Social Connection and Isolation Panel [NHANES]    Frequency of Communication with Friends and Family: More than three times a week    Frequency of Social Gatherings with Friends and Family: Once a week    Attends Religious Services: More than 4 times per year    Active Member of [employer] or Organizations: Yes    Attends Engineer, structural: More than 4 times per year    Marital Status: Married  Catering manager Violence: Not At Risk (08/10/2022)   Humiliation, Afraid, Rape, and Kick questionnaire    Fear of Current or Ex-Partner: No    Emotionally Abused: No    Physically Abused: No    Sexually Abused: No     Current Outpatient Medications:    AIMOVIG 140 MG/ML SOAJ, Inject into the skin., Disp: , Rfl:    albuterol (VENTOLIN HFA) 108 (90 Base) MCG/ACT inhaler, Inhale 2 puffs into the lungs every 6 (six) hours as needed for wheezing or shortness of breath., Disp: 18 g, Rfl: 2   DULoxetine (CYMBALTA) 30 MG capsule, Take 1 capsule (30 mg total) by mouth daily., Disp: 90 capsule, Rfl: 1   fexofenadine (ALLEGRA ALLERGY) 180 MG tablet, Take 1 tablet (180 mg total) by mouth as needed., Disp: 30 tablet, Rfl: 5   Fluticasone-Umeclidin-Vilant 100-62.5-25 MCG/ACT AEPB, Inhale 1 each into the lungs daily at 12 noon., Disp: 60 each, Rfl: 2   gabapentin (NEURONTIN) 300 MG capsule, Take 300 mg by mouth 3 (three) times daily., Disp: , Rfl:    hyoscyamine (LEVSIN) 0.125 MG tablet, Take 1 tablet (0.125 mg  total) by mouth every 4 (four) hours as needed., Disp: 100 tablet, Rfl: 1   methocarbamol (ROBAXIN) 500 MG tablet, Take 1 tablet (500 mg total) by mouth every 8 (eight) hours as needed for muscle spasms., Disp: 30 tablet, Rfl: 1   naproxen  (NAPROSYN) 500 MG tablet, Take 1 tablet (500 mg total) by mouth 2 (two) times daily with a meal., Disp: 60 tablet, Rfl: 11   omeprazole (PRILOSEC) 40 MG capsule, Take 1 capsule (40 mg total) by mouth 2 (two) times daily before a meal., Disp: 180 capsule, Rfl: 0   predniSONE (DELTASONE) 20 MG tablet, Take 60mg  PO daily x 2 days, then40mg  PO daily x 2 days, then 20mg  PO daily x 3 days, Disp: 13 tablet, Rfl: 0  Allergies  Allergen Reactions   Avocado Hives, Shortness Of Breath and Swelling    and raw fruit and vegetables   Daucus Carota Hives, Shortness Of Breath and Swelling    (Carrots)   Other Hives, Shortness Of Breath and Swelling    Pears, avacados, nuts     ROS  Constitutional: Negative for fever or weight change.  Respiratory: Negative for cough and shortness of breath.   Cardiovascular: Negative for chest pain or palpitations.  Gastrointestinal: Negative for abdominal pain, no bowel changes.  Musculoskeletal: Negative for gait problem or joint swelling.  Skin: Negative for rash.  Neurological: Negative for dizziness or headache.  No other specific complaints in a complete review of systems (except as listed in HPI above).   Objective  Vitals:   10/02/23 1101  BP: 116/72  Pulse: 81  Temp: 98.7 F (37.1 C)  SpO2: 98%  Weight: 141 lb 6.4 oz (64.1 kg)  Height: 5\' 3"  (1.6 m)    Body mass index is 25.05 kg/m.  Physical Exam  Constitutional: Patient appears well-developed and well-nourished. No distress.  HENT: Head: Normocephalic and atraumatic. Ears: B TMs ok, no erythema or effusion; Nose: Nose normal. Mouth/Throat: Oropharynx is clear and moist. No oropharyngeal exudate.  Eyes: Conjunctivae and EOM are normal. Pupils are equal, round, and reactive to light. No scleral icterus.  Neck: Normal range of motion. Neck supple. No JVD present. No thyromegaly present.  Cardiovascular: Normal rate, regular rhythm and normal heart sounds.  No murmur heard. No BLE  edema. Pulmonary/Chest: Effort normal and breath sounds normal. No respiratory distress. Abdominal: Soft. Bowel sounds are normal, no distension. There is no tenderness. no masses Breast: no lumps or masses, no nipple discharge or rashes FEMALE GENITALIA:  Not done  RECTAL: not done  Musculoskeletal: Normal range of motion, no joint effusions. No gross deformities Neurological: he is alert and oriented to person, place, and time. No cranial nerve deficit. Coordination, balance, strength, speech and gait are normal.  Skin: Skin is warm and dry. No rash noted. No erythema.  Psychiatric: Patient has a normal mood and affect. behavior is normal. Judgment and thought content normal.   Fall Risk:    10/01/2023    1:16 PM 09/12/2023   10:05 AM 12/14/2022    9:35 AM 11/19/2022    8:05 AM 11/01/2022   11:03 AM  Fall Risk   Falls in the past year? 0 0 0 1 1  Number falls in past yr: 0 0 0 0 0  Injury with Fall? 0 0 0 0 0  Risk for fall due to : No Fall Risks No Fall Risks No Fall Risks No Fall Risks History of fall(s)  Follow up Falls prevention discussed Falls  prevention discussed Falls prevention discussed;Education provided;Falls evaluation completed Falls prevention discussed Falls prevention discussed;Education provided;Falls evaluation completed    Assessment & Plan  1. Well adult exam  - HIV Antibody (routine testing w rflx) - MM 3D SCREENING MAMMOGRAM BILATERAL BREAST; Future - Lipid panel - COMPLETE METABOLIC PANEL WITH GFR - CBC with Differential/Platelet - Hemoglobin A1c - VITAMIN D 25 Hydroxy (Vit-D Deficiency, Fractures) - B12 and Folate Panel  2. Screening for HIV without presence of risk factors  - HIV Antibody (routine testing w rflx)  3. Vitamin D deficiency  - VITAMIN D 25 Hydroxy (Vit-D Deficiency, Fractures)  4. Lipid screening  - Lipid panel  5. Long-term use of high-risk medication  - COMPLETE METABOLIC PANEL WITH GFR - CBC with  Differential/Platelet  6. Memory change  - B12 and Folate Panel  7. Diabetes mellitus screening  - Hemoglobin A1c  8. Breast cancer screening by mammogram  - MM 3D SCREENING MAMMOGRAM BILATERAL BREAST; Future   -USPSTF grade A and B recommendations reviewed with patient; age-appropriate recommendations, preventive care, screening tests, etc discussed and encouraged; healthy living encouraged; see AVS for patient education given to patient -Discussed importance of 150 minutes of physical activity weekly, eat two servings of fish weekly, eat one serving of tree nuts ( cashews, pistachios, pecans, almonds.Marland Kitchen) every other day, eat 6 servings of fruit/vegetables daily and drink plenty of water and avoid sweet beverages.   -Reviewed Health Maintenance: Yes.

## 2023-10-01 NOTE — Progress Notes (Signed)
Acute Office Visit   Patient: Helen Green   DOB: April 29, 1966   57 y.o. Female  MRN: [id number] Visit Date: 10/01/2023  Today's healthcare provider: Oswaldo Conroy Lovinia Snare, PA-C  Introduced myself to the patient as a Secondary school teacher and provided education on APPs in clinical practice.    Chief Complaint  Patient presents with   Shortness of Breath    x1 month, non-productive cough   Wheezing    w/chest pains   Subjective    HPI HPI     Shortness of Breath    Additional comments: x1 month, non-productive cough        Wheezing    Additional comments: w/chest pains      Last edited by Dollene Primrose, CMA on 10/01/2023  1:23 PM.       Helen Green reports ongoing coughing and SOB  States Helen Green had acute illness before thanksgiving and cough has been persistent since then Helen Green has been using her daily inhaler and her rescue inhaler as directed Helen Green reports cough is dry but pervasive   Helen Green is using rescue inhaler about 2 times per day for symptom management   Helen Green reports generalized chest pains with coughing  Helen Green had a root canal a week or so ago and has been taking Tylenol for this   Medications: Outpatient Medications Prior to Visit  Medication Sig   AIMOVIG 140 MG/ML SOAJ Inject into the skin.   albuterol (VENTOLIN HFA) 108 (90 Base) MCG/ACT inhaler Inhale 2 puffs into the lungs every 6 (six) hours as needed for wheezing or shortness of breath.   DULoxetine (CYMBALTA) 30 MG capsule Take 1 capsule (30 mg total) by mouth daily.   fexofenadine (ALLEGRA ALLERGY) 180 MG tablet Take 1 tablet (180 mg total) by mouth as needed.   Fluticasone-Umeclidin-Vilant 100-62.5-25 MCG/ACT AEPB Inhale 1 each into the lungs daily at 12 noon.   gabapentin (NEURONTIN) 300 MG capsule Take 300 mg by mouth 3 (three) times daily.   hyoscyamine (LEVSIN) 0.125 MG tablet Take 1 tablet (0.125 mg total) by mouth every 4 (four) hours as needed.   methocarbamol (ROBAXIN) 500 MG tablet Take 1 tablet (500 mg  total) by mouth every 8 (eight) hours as needed for muscle spasms.   naproxen (NAPROSYN) 500 MG tablet Take 1 tablet (500 mg total) by mouth 2 (two) times daily with a meal.   omeprazole (PRILOSEC) 40 MG capsule Take 1 capsule (40 mg total) by mouth 2 (two) times daily before a meal.   No facility-administered medications prior to visit.    Review of Systems  Constitutional:  Negative for chills, fatigue and fever.  HENT:  Negative for congestion, postnasal drip and rhinorrhea.   Respiratory:  Positive for cough and shortness of breath.   Neurological:  Negative for dizziness and headaches.        Objective    BP 122/72   Pulse 82   Temp 98 F (36.7 C)   Resp 16   Ht 5\' 3"  (1.6 m)   Wt 138 lb (62.6 kg)   SpO2 98%   BMI 24.45 kg/m     Physical Exam Vitals reviewed.  Constitutional:      General: Helen Green is awake.     Appearance: Normal appearance. Helen Green is well-developed and well-groomed.  HENT:     Head: Normocephalic and atraumatic.  Cardiovascular:     Rate and Rhythm: Normal rate and regular rhythm.     Heart sounds:  Normal heart sounds. No murmur heard.    No friction rub. No gallop.  Pulmonary:     Effort: Pulmonary effort is normal.     Breath sounds: No decreased air movement. Examination of the left-upper field reveals wheezing and rhonchi. Wheezing and rhonchi present. No decreased breath sounds or rales.  Neurological:     Mental Status: Helen Green is alert.  Psychiatric:        Mood and Affect: Mood normal.        Behavior: Behavior normal. Behavior is cooperative.        Thought Content: Thought content normal.        Judgment: Judgment normal.       No results found for any visits on 10/01/23.  Assessment & Plan      No follow-ups on file.      Problem List Items Addressed This Visit       Respiratory   Reactive airway disease - Primary   Relevant Medications   predniSONE (DELTASONE) 20 MG tablet   Acute exacerbation Patient reports ongoing  shortness of breath and coughing for the last week or so following a brief URI around Thanksgiving Helen Green has been using her inhalers as directed.  Helen Green reports increased use of her rescue inhaler but these are not providing adequate relief Physical exam is notable for wheezing and rhonchi especially in the left upper fields Suspect this is likely exacerbation of reactive airway disease.  Will send in prednisone taper to assist with symptoms Reviewed ED and return precautions Follow-up as needed for progressing or persistent symptoms  No follow-ups on file.   I, Carnelia Oscar E Antonae Zbikowski, PA-C, have reviewed all documentation for this visit. The documentation on 10/01/23 for the exam, diagnosis, procedures, and orders are all accurate and complete.   Jacquelin Hawking, MHS, PA-C [facility]

## 2023-10-01 NOTE — Patient Instructions (Addendum)
I have sent in a script for Prednisone taper to be taken in the morning with breakfast per the instructions on the container Remember that steroids can cause sleeplessness, irritability, increased hunger and elevated glucose levels so be mindful of these side effects. They should lessen as you progress to the lower doses of the taper.   I also recommend adding an antihistamine to your daily regimen This includes medications like Claritin, Allegra, Zyrtec- the generics of these work very well and are usually less expensive I recommend using Flonase nasal spray - 2 puffs twice per day to help with your nasal congestion The antihistamines and Flonase can take a few weeks to provide significant relief from allergy symptoms but should start to provide some benefit soon.   If you continue to have coughing, shortness of breath, or develop fevers, chills, productive cough, lightheadedness please go to Urgent Care or the ED for evaluation.

## 2023-10-02 ENCOUNTER — Encounter: Payer: Self-pay | Admitting: Family Medicine

## 2023-10-02 ENCOUNTER — Ambulatory Visit (INDEPENDENT_AMBULATORY_CARE_PROVIDER_SITE_OTHER): Payer: 59 | Admitting: Family Medicine

## 2023-10-02 VITALS — BP 116/72 | HR 81 | Temp 98.7°F | Ht 63.0 in | Wt 141.4 lb

## 2023-10-02 DIAGNOSIS — Z114 Encounter for screening for human immunodeficiency virus [HIV]: Secondary | ICD-10-CM

## 2023-10-02 DIAGNOSIS — Z131 Encounter for screening for diabetes mellitus: Secondary | ICD-10-CM | POA: Diagnosis not present

## 2023-10-02 DIAGNOSIS — Z0000 Encounter for general adult medical examination without abnormal findings: Secondary | ICD-10-CM

## 2023-10-02 DIAGNOSIS — Z1231 Encounter for screening mammogram for malignant neoplasm of breast: Secondary | ICD-10-CM

## 2023-10-02 DIAGNOSIS — R413 Other amnesia: Secondary | ICD-10-CM

## 2023-10-02 DIAGNOSIS — Z13220 Encounter for screening for lipoid disorders: Secondary | ICD-10-CM | POA: Diagnosis not present

## 2023-10-02 DIAGNOSIS — Z79899 Other long term (current) drug therapy: Secondary | ICD-10-CM

## 2023-10-02 DIAGNOSIS — E559 Vitamin D deficiency, unspecified: Secondary | ICD-10-CM | POA: Diagnosis not present

## 2023-10-02 NOTE — Patient Instructions (Addendum)
Need PCV 20, Tdap, Shingrix, flu vaccine

## 2023-10-03 LAB — COMPLETE METABOLIC PANEL WITH GFR
AG Ratio: 2.1 *Unknown (ref 1.0–2.5)
ALT: 10 U/L (ref 6–29)
AST: 11 U/L (ref 10–35)
Albumin: 4.6 g/dL (ref 3.6–5.1)
Alkaline phosphatase (APISO): 56 U/L (ref 37–153)
BUN: 11 mg/dL (ref 7–25)
CO2: 29 mmol/L (ref 20–32)
Calcium: 9.5 mg/dL (ref 8.6–10.4)
Chloride: 106 mmol/L (ref 98–110)
Creat: 0.66 mg/dL (ref 0.50–1.03)
Globulin: 2.2 g/dL (ref 1.9–3.7)
Glucose, Bld: 86 mg/dL (ref 65–99)
Potassium: 4.2 mmol/L (ref 3.5–5.3)
Sodium: 142 mmol/L (ref 135–146)
Total Bilirubin: 0.6 mg/dL (ref 0.2–1.2)
Total Protein: 6.8 g/dL (ref 6.1–8.1)
eGFR: 104 mL/min/{1.73_m2} (ref >=60)

## 2023-10-03 LAB — CBC WITH DIFFERENTIAL/PLATELET
Absolute Lymphocytes: 1191 {cells}/uL (ref 850–3900)
Absolute Monocytes: 229 {cells}/uL (ref 200–950)
Basophils Absolute: 52 {cells}/uL (ref 0–200)
Basophils Relative: 0.7 *Unknown
Eosinophils Absolute: 178 {cells}/uL (ref 15–500)
Eosinophils Relative: 2.4 *Unknown
HCT: 41.8 *Unknown (ref 35.0–45.0)
Hemoglobin: 13.7 g/dL (ref 11.7–15.5)
MCH: 28.3 pg (ref 27.0–33.0)
MCHC: 32.8 g/dL (ref 32.0–36.0)
MCV: 86.4 fL (ref 80.0–100.0)
MPV: 10.2 fL (ref 7.5–12.5)
Monocytes Relative: 3.1 *Unknown
Neutro Abs: 5750 {cells}/uL (ref 1500–7800)
Neutrophils Relative %: 77.7 *Unknown
Platelets: 361 10*3/uL (ref 140–400)
RBC: 4.84 10*6/uL (ref 3.80–5.10)
RDW: 13.2 *Unknown (ref 11.0–15.0)
Total Lymphocyte: 16.1 *Unknown
WBC: 7.4 10*3/uL (ref 3.8–10.8)

## 2023-10-03 LAB — B12 AND FOLATE PANEL
Folate: 23 ng/mL
Vitamin B-12: 1193 pg/mL — ABNORMAL HIGH (ref 200–1100)

## 2023-10-03 LAB — LIPID PANEL
Cholesterol: 188 mg/dL (ref <200)
HDL: 62 mg/dL (ref >=50)
LDL Cholesterol (Calc): 92 mg/dL
Non-HDL Cholesterol (Calc): 126 mg/dL (ref <130)
Total CHOL/HDL Ratio: 3 *Unknown (ref <5.0)
Triglycerides: 247 mg/dL — ABNORMAL HIGH (ref <150)

## 2023-10-03 LAB — HEMOGLOBIN A1C
Hgb A1c MFr Bld: 5.7 *Unknown — ABNORMAL HIGH (ref <5.7)
Mean Plasma Glucose: 117 mg/dL
eAG (mmol/L): 6.5 mmol/L

## 2023-10-03 LAB — HIV ANTIBODY (ROUTINE TESTING W REFLEX): HIV 1&2 Ab, 4th Generation: NONREACTIVE

## 2023-10-03 LAB — VITAMIN D 25 HYDROXY (VIT D DEFICIENCY, FRACTURES): Vit D, 25-Hydroxy: 20 ng/mL — ABNORMAL LOW (ref 30–100)

## 2023-12-10 ENCOUNTER — Other Ambulatory Visit: Payer: Self-pay | Admitting: Nurse Practitioner

## 2023-12-10 DIAGNOSIS — J4521 Mild intermittent asthma with (acute) exacerbation: Secondary | ICD-10-CM

## 2023-12-11 NOTE — Telephone Encounter (Signed)
Requested medication (s) are due for refill today: see below  Requested medication (s) are on the active medication list: yes  Last refill:  09/12/23  Future visit scheduled: yes  Notes to clinic:  Pharmacy comment: Alternative Requested:THE PRESCRIBED MEDICATION IS NOT COVERED BY INSURANCE. PLEASE CONSIDER CHANGING TO ONE OF THE SUGGESTED COVERED ALTERNATIVES.      Requested Prescriptions  Pending Prescriptions Disp Refills   BREZTRI AEROSPHERE 160-9-4.8 MCG/ACT AERO [Pharmacy Med Name: BREZTRI AEROSPHERE INHALER]  0     Off-Protocol Failed - 12/11/2023  2:01 PM      Failed - Medication not assigned to a protocol, review manually.      Passed - Valid encounter within last 12 months    Recent Outpatient Visits           2 months ago Well adult exam   [facility] Alba Cory, MD   2 months ago Mild intermittent reactive airway disease with acute exacerbation   [facility] Mecum, Oswaldo Conroy, PA-C   3 months ago Mild intermittent reactive airway disease with acute exacerbation   [facility] Berniece Salines, FNP   12 months ago Post concussion syndrome   [facility] Danelle Berry, PA-C   1 year ago Post concussion syndrome   [facility] Alba Cory, MD       Future Appointments             In 3 weeks Alba Cory, MD [facility]   In 9 months Alba Cory, MD [facility], [facility]

## 2023-12-17 ENCOUNTER — Encounter (HOSPITAL_COMMUNITY): Payer: Self-pay | Admitting: Emergency Medicine

## 2023-12-17 ENCOUNTER — Emergency Department (HOSPITAL_COMMUNITY)
Admission: EM | Admit: 2023-12-17 | Discharge: 2023-12-18 | Disposition: A | Discharge status: Home / Self Care | Payer: Self-pay | Attending: Emergency Medicine | Admitting: Emergency Medicine

## 2023-12-17 ENCOUNTER — Other Ambulatory Visit: Payer: Self-pay

## 2023-12-17 ENCOUNTER — Emergency Department (HOSPITAL_COMMUNITY): Payer: Self-pay

## 2023-12-17 DIAGNOSIS — R519 Headache, unspecified: Secondary | ICD-10-CM | POA: Diagnosis present

## 2023-12-17 DIAGNOSIS — Z85828 Personal history of other malignant neoplasm of skin: Secondary | ICD-10-CM | POA: Insufficient documentation

## 2023-12-17 LAB — COMPREHENSIVE METABOLIC PANEL
ALT: 17 U/L (ref 0–44)
AST: 18 U/L (ref 15–41)
Albumin: 4.4 g/dL (ref 3.5–5.0)
Alkaline Phosphatase: 55 U/L (ref 38–126)
Anion gap: 8 (ref 5–15)
BUN: 12 mg/dL (ref 6–20)
CO2: 25 mmol/L (ref 22–32)
Calcium: 9.4 mg/dL (ref 8.9–10.3)
Chloride: 107 mmol/L (ref 98–111)
Creatinine, Ser: 0.66 mg/dL (ref 0.44–1.00)
GFR, Estimated: >60 mL/min (ref >60)
Glucose, Bld: 109 mg/dL — ABNORMAL HIGH (ref 70–99)
Potassium: 4 mmol/L (ref 3.5–5.1)
Sodium: 140 mmol/L (ref 135–145)
Total Bilirubin: 0.7 mg/dL (ref 0.0–1.2)
Total Protein: 7.3 g/dL (ref 6.5–8.1)

## 2023-12-17 LAB — CBC WITH DIFFERENTIAL/PLATELET
Abs Immature Granulocytes: 0.01 10*3/uL (ref 0.00–0.07)
Basophils Absolute: 0 10*3/uL (ref 0.0–0.1)
Basophils Relative: 1 %
Eosinophils Absolute: 0.3 10*3/uL (ref 0.0–0.5)
Eosinophils Relative: 4 %
HCT: 43.6 % (ref 36.0–46.0)
Hemoglobin: 14.5 g/dL (ref 12.0–15.0)
Immature Granulocytes: 0 %
Lymphocytes Relative: 34 %
Lymphs Abs: 2.4 10*3/uL (ref 0.7–4.0)
MCH: 28.8 pg (ref 26.0–34.0)
MCHC: 33.3 g/dL (ref 30.0–36.0)
MCV: 86.5 fL (ref 80.0–100.0)
Monocytes Absolute: 0.5 10*3/uL (ref 0.1–1.0)
Monocytes Relative: 8 %
Neutro Abs: 3.9 10*3/uL (ref 1.7–7.7)
Neutrophils Relative %: 53 %
Platelets: 316 10*3/uL (ref 150–400)
RBC: 5.04 MIL/uL (ref 3.87–5.11)
RDW: 13.3 % (ref 11.5–15.5)
WBC: 7.2 10*3/uL (ref 4.0–10.5)
nRBC: 0 % (ref 0.0–0.2)

## 2023-12-17 LAB — MAGNESIUM: Magnesium: 2.3 mg/dL (ref 1.7–2.4)

## 2023-12-17 NOTE — ED Provider Triage Note (Signed)
 Emergency Medicine Provider Triage Evaluation Note  Kiannah Grunow , a 58 y.o. female  was evaluated in triage.  Pt complains of headache.  Patient reports prior concussion in 2023 and occasionally having intermittent headaches.  States that over last 2 days, has had a headache with some sensory deficits.  States that at this time she does not of a headache but continues to have primarily left-sided tingling and numbness.  Denies any motor weakness.  No recent slurred speech.  She is on any blood thinning medications.  No recent head strike or head injury.  Review of Systems  Positive: As above Negative: As above  Physical Exam  BP 139/86   Pulse 91   Temp 98.6 F (37 C) (Oral)   Resp 18   SpO2 97%  Gen:   Awake, no distress   Resp:  Normal effort  MSK:   Moves extremities without difficulty  Other:  Neurological exam reveals bilateral 4 out of 5 strength in the upper extremity and lower extremities.  No facial droop.  No dysarthria.  No evident slurred speech.  Photosensitivity present.  Medical Decision Making  Medically screening exam initiated at 5:43 PM.  Appropriate orders placed.  Shimika Ames was informed that the remainder of the evaluation will be completed by another provider, this initial triage assessment does not replace that evaluation, and the importance of remaining in the ED until their evaluation is complete.     Smitty Knudsen, PA-C 12/17/23 1745

## 2023-12-17 NOTE — ED Triage Notes (Signed)
 Patient presents due to headaches due to concussions. This weekend she began to have low back pain, face numbness that radiated into her left arm. She had another episode of this today. She believes the numbness is due to headaches. She also reports her tongue "felt weird" and she had trouble speaking.

## 2023-12-18 MED ORDER — DIAZEPAM 2 MG PO TABS: 2.0000 mg | ORAL_TABLET | Freq: Every evening | ORAL | 0 refills | Status: DC | PRN

## 2023-12-18 MED ORDER — DIAZEPAM 2 MG PO TABS
2.0000 mg | ORAL_TABLET | Freq: Once | ORAL | Status: AC
  Administered 2023-12-18: 2 mg via ORAL
  Filled 2023-12-18: qty 1
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
## 2024-01-02 ENCOUNTER — Ambulatory Visit: Payer: Self-pay | Admitting: Family Medicine

## 2024-01-02 ENCOUNTER — Encounter: Payer: Self-pay | Admitting: Family Medicine

## 2024-01-02 VITALS — BP 126/74 | HR 87 | Resp 16 | Ht 63.0 in | Wt 138.6 lb

## 2024-01-02 DIAGNOSIS — G43009 Migraine without aura, not intractable, without status migrainosus: Secondary | ICD-10-CM | POA: Diagnosis not present

## 2024-01-02 DIAGNOSIS — G44329 Chronic post-traumatic headache, not intractable: Secondary | ICD-10-CM | POA: Insufficient documentation

## 2024-01-02 DIAGNOSIS — J3089 Other allergic rhinitis: Secondary | ICD-10-CM

## 2024-01-02 DIAGNOSIS — K582 Mixed irritable bowel syndrome: Secondary | ICD-10-CM

## 2024-01-02 DIAGNOSIS — J302 Other seasonal allergic rhinitis: Secondary | ICD-10-CM

## 2024-01-02 DIAGNOSIS — E559 Vitamin D deficiency, unspecified: Secondary | ICD-10-CM

## 2024-01-02 DIAGNOSIS — R7303 Prediabetes: Secondary | ICD-10-CM | POA: Insufficient documentation

## 2024-01-02 DIAGNOSIS — J4520 Mild intermittent asthma, uncomplicated: Secondary | ICD-10-CM

## 2024-01-02 DIAGNOSIS — K219 Gastro-esophageal reflux disease without esophagitis: Secondary | ICD-10-CM

## 2024-01-02 DIAGNOSIS — E8881 Metabolic syndrome: Secondary | ICD-10-CM

## 2024-01-02 MED ORDER — OMEPRAZOLE 40 MG PO CPDR: 40.0000 mg | DELAYED_RELEASE_CAPSULE | Freq: Every day | ORAL | 1 refills | Status: DC

## 2024-01-02 MED ORDER — AIRSUPRA 90-80 MCG/ACT IN AERO: 2.0000 | INHALATION_SPRAY | Freq: Four times a day (QID) | RESPIRATORY_TRACT | 0 refills | Status: AC

## 2024-01-02 NOTE — Progress Notes (Signed)
 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 She has a new insurance since January and has been out of Aimovig and no longer can go to [employer]. She was previously seen at [facility] and will try to go see Dr. Malvin Johns again. She does not know who is in network. She states headache is daily again and almost all day. She was pain free this am but is now at 7/10. Sometimes is stabbing and temporal, usually starts on her neck and radiates to the entire head. It moves around  She also has phonophobia, photophobia.   GERD: stable , taking omeprazole 40 mg daily   Mild intermittent Asthma: she was taking Trelegy in the past and it worked well for her in the past, we will switch to [place]  IBS: doing better since started on Gabapentin and Nortriptyline  but has more constipation and dry mouth as side effects of nortriptyline .   Metabolic syndrome: A1C was elevated in Dec, discussed low carbohydrate diet, mother has DM. Denies polyphagia, polydipsia or polyuria . She also has increase triglycerides and abdominal girth  Patient Active Problem List   Diagnosis Date Noted   Migraine without aura, not intractable, without status migrainosus 01/02/2024   Chronic post-traumatic headache, not intractable 01/02/2024   Pre-diabetes 01/02/2024   History of colonic polyps 08/30/2022   Chronic GERD 08/30/2022   Gastroesophageal reflux disease 08/30/2022   Lateral epicondylitis of both elbows  07/24/2022   Arthralgia of both hands 07/24/2022   Hot flashes due to menopause 01/05/2022   Anxiety 01/05/2022   Reactive airway disease 07/05/2020   Insomnia, persistent 05/03/2015   Functional dyspepsia 05/03/2015   Gastro-esophageal reflux disease without esophagitis 05/03/2015   Perennial allergic rhinitis with seasonal variation 05/03/2015   Arthritis of temporomandibular joint 05/03/2015   Vitamin D deficiency 05/03/2015   Irritable bowel syndrome with both constipation and diarrhea 05/03/2015    Past Surgical History:  Procedure Laterality Date   ABDOMINAL HYSTERECTOMY     BRAVO PH STUDY N/A 08/30/2022   Procedure: BRAVO PH STUDY;  Surgeon: Toney Reil, MD;  Location: [facility] ENDOSCOPY;  Service: Gastroenterology;  Laterality: N/A;  on ppi   COLONOSCOPY WITH PROPOFOL N/A 09/27/2017   Procedure: COLONOSCOPY WITH PROPOFOL;  Surgeon: Wyline Mood, MD;  Location: [facility] ENDOSCOPY;  Service: Gastroenterology;  Laterality: N/A;   COLONOSCOPY WITH PROPOFOL N/A 08/30/2022   Procedure: COLONOSCOPY WITH PROPOFOL;  Surgeon: Toney Reil, MD;  Location: [facility] ENDOSCOPY;  Service: Gastroenterology;  Laterality: N/A;   ESOPHAGOGASTRODUODENOSCOPY N/A 08/30/2022   Procedure: ESOPHAGOGASTRODUODENOSCOPY (EGD);  Surgeon: Toney Reil, MD;  Location: [facility] ENDOSCOPY;  Service: Gastroenterology;  Laterality: N/A;   ESOPHAGOGASTRODUODENOSCOPY (EGD) WITH PROPOFOL N/A 09/27/2017   Procedure: ESOPHAGOGASTRODUODENOSCOPY (EGD) WITH PROPOFOL;  Surgeon: Wyline Mood, MD;  Location: [facility] ENDOSCOPY;  Service: Gastroenterology;  Laterality: N/A;   TUBAL LIGATION      Family History  Problem Relation Age of Onset  Diabetes Mother    Hyperlipidemia Mother    Hypertension Mother    Liver disease Father    Cancer Brother 33       brain cancer   Breast cancer Neg Hx     Social History   Tobacco Use   Smoking status: Never   Smokeless tobacco: Never  Substance Use Topics   Alcohol use: No     Alcohol/week: 0.0 standard drinks of alcohol     Current Outpatient Medications:    AIMOVIG 140 MG/ML SOAJ, Inject into the skin., Disp: , Rfl:    Albuterol-Budesonide (AIRSUPRA) 90-80 MCG/ACT AERO, Inhale 2 puffs into the lungs in the morning, at noon, in the evening, and at bedtime., Disp: 32.1 g, Rfl: 0   Cholecalciferol (VITAMIN D) 50 MCG (2000 UT) CAPS, Take 1 capsule by mouth daily at 12 noon., Disp: , Rfl:    DULoxetine (CYMBALTA) 30 MG capsule, Take 1 capsule (30 mg total) by mouth daily., Disp: 90 capsule, Rfl: 1   fexofenadine (ALLEGRA ALLERGY) 180 MG tablet, Take 1 tablet (180 mg total) by mouth as needed., Disp: 30 tablet, Rfl: 5   gabapentin (NEURONTIN) 300 MG capsule, Take 300 mg by mouth 3 (three) times daily., Disp: , Rfl:    hyoscyamine (LEVSIN) 0.125 MG tablet, Take 1 tablet (0.125 mg total) by mouth every 4 (four) hours as needed., Disp: 100 tablet, Rfl: 1   nortriptyline (PAMELOR) 25 MG capsule, Take 25 mg by mouth at bedtime., Disp: , Rfl:    omeprazole (PRILOSEC) 40 MG capsule, Take 1 capsule (40 mg total) by mouth daily., Disp: 90 capsule, Rfl: 1  Allergies  Allergen Reactions   Avocado Hives, Shortness Of Breath and Swelling    and raw fruit and vegetables   Daucus Carota Hives, Shortness Of Breath and Swelling    (Carrots)   Other Hives, Shortness Of Breath and Swelling    Pears, avacados, nuts    I personally reviewed active problem list, medication list, allergies, family history with the patient/caregiver today.   ROS  Ten systems reviewed and is negative except as mentioned in HPI    Objective  Vitals:   01/02/24 1100  BP: 126/74  Pulse: 87  Resp: 16  SpO2: 98%  Weight: 138 lb 9.6 oz (62.9 kg)  Height: 5\' 3"  (1.6 m)    Body mass index is 24.55 kg/m.  Physical Exam  Constitutional: Patient appears well-developed and well-nourished.  No distress.  HEENT: head atraumatic, normocephalic, pupils equal and reactive to light, neck  supple Cardiovascular: Normal rate, regular rhythm and normal heart sounds.  No murmur heard. No BLE edema. Pulmonary/Chest: Effort normal and breath sounds normal. No respiratory distress. Abdominal: Soft.  There is no tenderness. Psychiatric: Patient has a normal mood and affect. behavior is normal. Judgment and thought content normal.   Recent Results (from the past 2160 hours)  CBC with Differential     Status: None   Collection Time: 12/17/23  6:08 PM  Result Value Ref Range   WBC 7.2 4.0 - 10.5 K/uL   RBC 5.04 3.87 - 5.11 MIL/uL   Hemoglobin 14.5 12.0 - 15.0 g/dL   HCT 33.2 95.1 - 88.4 %   MCV 86.5 80.0 - 100.0 fL   MCH 28.8 26.0 - 34.0 pg   MCHC 33.3 30.0 - 36.0 g/dL   RDW 16.6 06.3 - 01.6 %   Platelets 316 150 - 400 K/uL   nRBC 0.0 0.0 - 0.2 %   Neutrophils  Relative % 53 %   Neutro Abs 3.9 1.7 - 7.7 K/uL   Lymphocytes Relative 34 %   Lymphs Abs 2.4 0.7 - 4.0 K/uL   Monocytes Relative 8 %   Monocytes Absolute 0.5 0.1 - 1.0 K/uL   Eosinophils Relative 4 %   Eosinophils Absolute 0.3 0.0 - 0.5 K/uL   Basophils Relative 1 %   Basophils Absolute 0.0 0.0 - 0.1 K/uL   Immature Granulocytes 0 %   Abs Immature Granulocytes 0.01 0.00 - 0.07 K/uL    Comment: Performed at [facility], [address] [place], [place] [address]  Comprehensive metabolic panel     Status: Abnormal   Collection Time: 12/17/23  6:08 PM  Result Value Ref Range   Sodium 140 135 - 145 mmol/L   Potassium 4.0 3.5 - 5.1 mmol/L   Chloride 107 98 - 111 mmol/L   CO2 25 22 - 32 mmol/L   Glucose, Bld 109 (H) 70 - 99 mg/dL    Comment: Glucose reference range applies only to samples taken after fasting for at least 8 hours.   BUN 12 6 - 20 mg/dL   Creatinine, Ser 6.04 0.44 - 1.00 mg/dL   Calcium 9.4 8.9 - 54.0 mg/dL   Total Protein 7.3 6.5 - 8.1 g/dL   Albumin 4.4 3.5 - 5.0 g/dL   AST 18 15 - 41 U/L   ALT 17 0 - 44 U/L   Alkaline Phosphatase 55 38 - 126 U/L   Total Bilirubin 0.7 0.0 -  1.2 mg/dL   GFR, Estimated >98 >11 mL/min    Comment: (NOTE) Calculated using the CKD-EPI Creatinine Equation (2021)    Anion gap 8 5 - 15    Comment: Performed at [facility], [address] [place], [place] [address]  Magnesium     Status: None   Collection Time: 12/17/23  6:08 PM  Result Value Ref Range   Magnesium 2.3 1.7 - 2.4 mg/dL    Comment: Performed at [facility], [address], [place], [place] [address]    Diabetic Foot Exam:     PHQ2/9:    01/02/2024   10:59 AM 10/01/2023    1:16 PM 09/12/2023   10:06 AM 12/14/2022    9:35 AM 11/19/2022    8:05 AM  Depression screen PHQ 2/9  Decreased Interest 0 0 0 0 0  Down, Depressed, Hopeless 0 0 0 0 0  PHQ - 2 Score 0 0 0 0 0  Altered sleeping 0   0 0  Tired, decreased energy 0   0 0  Change in appetite 0   0 0  Feeling bad or failure about yourself  0   0 0  Trouble concentrating 0   0 0  Moving slowly or fidgety/restless 0   0 0  Suicidal thoughts 0   0 0  PHQ-9 Score 0   0 0  Difficult doing work/chores Not difficult at all   Not difficult at all     phq 9 is negative  Fall Risk:    10/01/2023    1:16 PM 09/12/2023   10:05 AM 12/14/2022    9:35 AM 11/19/2022    8:05 AM 11/01/2022   11:03 AM  Fall Risk   Falls in the past year? 0 0 0 1 1  Number falls in past yr: 0 0 0 0 0  Injury with Fall? 0 0 0 0 0  Risk for fall  due to : No Fall Risks No Fall Risks No Fall Risks No Fall Risks History of fall(s)  Follow up Falls prevention discussed Falls prevention discussed Falls prevention discussed;Education provided;Falls evaluation completed Falls prevention discussed Falls prevention discussed;Education provided;Falls evaluation completed     Assessment & Plan  1. Chronic post-traumatic headache, not intractable (Primary)  Needs to follow up with neurologist and resume medications   2. Migraine without aura, not intractable, without status migrainosus  Having frequent  episodes  3. Mild intermittent reactive airway disease without complication  - Albuterol-Budesonide (AIRSUPRA) 90-80 MCG/ACT AERO; Inhale 2 puffs into the lungs in the morning, at noon, in the evening, and at bedtime.  Dispense: 32.1 g; Refill: 0  4. Perennial allergic rhinitis with seasonal variation  Discussed allegra daily, nasal saline  5. Irritable bowel syndrome with both constipation and diarrhea  stable  6. Gastro-esophageal reflux disease without esophagitis  - omeprazole (PRILOSEC) 40 MG capsule; Take 1 capsule (40 mg total) by mouth daily.  Dispense: 90 capsule; Refill: 1  7. Vitamin D deficiency  Continue supplementation   8. Metabolic syndrome  Discussed life style modification

## 2024-01-02 NOTE — Patient Instructions (Addendum)
 Call insurance to find out what medications they pay for migraine headaches similar to Aimovig

## 2024-01-07 ENCOUNTER — Inpatient Hospital Stay: Admitting: Family Medicine

## 2024-01-09 ENCOUNTER — Ambulatory Visit
Admission: RE | Admit: 2024-01-09 | Discharge: 2024-01-09 | Disposition: A | Discharge status: Home / Self Care | Source: Ambulatory Visit | Attending: Family Medicine | Admitting: Family Medicine

## 2024-01-09 DIAGNOSIS — Z1231 Encounter for screening mammogram for malignant neoplasm of breast: Secondary | ICD-10-CM | POA: Insufficient documentation

## 2024-01-09 DIAGNOSIS — Z0000 Encounter for general adult medical examination without abnormal findings: Secondary | ICD-10-CM | POA: Diagnosis present

## 2024-02-10 ENCOUNTER — Other Ambulatory Visit: Payer: Self-pay | Admitting: Family Medicine

## 2024-02-10 ENCOUNTER — Encounter: Payer: Self-pay | Admitting: Family Medicine

## 2024-02-10 DIAGNOSIS — G44329 Chronic post-traumatic headache, not intractable: Secondary | ICD-10-CM

## 2024-02-10 DIAGNOSIS — G43009 Migraine without aura, not intractable, without status migrainosus: Secondary | ICD-10-CM

## 2024-05-21 ENCOUNTER — Telehealth: Payer: Self-pay

## 2024-05-21 ENCOUNTER — Other Ambulatory Visit: Payer: Self-pay

## 2024-05-21 DIAGNOSIS — G44329 Chronic post-traumatic headache, not intractable: Secondary | ICD-10-CM

## 2024-05-21 DIAGNOSIS — G43009 Migraine without aura, not intractable, without status migrainosus: Secondary | ICD-10-CM

## 2024-05-21 NOTE — Telephone Encounter (Signed)
Pt would like a call back about a referral

## 2024-06-08 ENCOUNTER — Ambulatory Visit: Payer: Self-pay | Admitting: *Deleted

## 2024-06-08 NOTE — Telephone Encounter (Signed)
 FYI Only or Action Required?: FYI only for provider.  Patient was last seen in primary care on 01/02/2024 by Glenard Mire, MD.  Called Nurse Triage reporting Abdominal Pain.  Symptoms began a week ago.  Interventions attempted: Nothing.  Symptoms are: gradually worsening.  Triage Disposition: See PCP When Office is Open (Within 3 Days)  Patient/caregiver understands and will follow disposition?: yes   Reason for Disposition  Nausea lasts > 1 week  Answer Assessment - Initial Assessment Questions 1. NAUSEA SEVERITY: How bad is the nausea? (e.g., mild, moderate, severe; dehydration, weight loss)     mild 2. ONSET: When did the nausea begin?     Every day- 1 week 3. VOMITING: Any vomiting? If Yes, ask: How many times today?     no 4. RECURRENT SYMPTOM: Have you had nausea before? If Yes, ask: When was the last time? What happened that time?     New symptom- not sure cause 5. CAUSE: What do you think is causing the nausea?     Gastritis hx- has GI , patient states she has hx concussion also- she is seeing neurology for that- patient states she has been having increased GI symptoms-stomach noises  Protocols used: Nausea-A-AH  Copied from CRM #[id number]. Topic: Clinical - Red Word Triage >> Jun 08, 2024 11:04 AM Delon DASEN wrote: Red Word that prompted transfer to Nurse Triage: abdominal pain with nausea, level 8 pain, dizziness with headache
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
## 2024-06-09 ENCOUNTER — Encounter: Payer: Self-pay | Admitting: Neurology

## 2024-06-09 ENCOUNTER — Telehealth: Payer: Self-pay

## 2024-06-09 ENCOUNTER — Ambulatory Visit (INDEPENDENT_AMBULATORY_CARE_PROVIDER_SITE_OTHER): Admitting: Neurology

## 2024-06-09 ENCOUNTER — Ambulatory Visit: Admitting: Family Medicine

## 2024-06-09 VITALS — BP 113/72 | HR 86 | Ht 63.0 in | Wt 136.0 lb

## 2024-06-09 VITALS — BP 108/74 | HR 98 | Resp 16 | Ht 63.0 in | Wt 135.3 lb

## 2024-06-09 DIAGNOSIS — R112 Nausea with vomiting, unspecified: Secondary | ICD-10-CM

## 2024-06-09 DIAGNOSIS — G43709 Chronic migraine without aura, not intractable, without status migrainosus: Secondary | ICD-10-CM | POA: Diagnosis not present

## 2024-06-09 DIAGNOSIS — R1084 Generalized abdominal pain: Secondary | ICD-10-CM | POA: Diagnosis not present

## 2024-06-09 DIAGNOSIS — N951 Menopausal and female climacteric states: Secondary | ICD-10-CM

## 2024-06-09 DIAGNOSIS — K582 Mixed irritable bowel syndrome: Secondary | ICD-10-CM | POA: Diagnosis not present

## 2024-06-09 DIAGNOSIS — K219 Gastro-esophageal reflux disease without esophagitis: Secondary | ICD-10-CM

## 2024-06-09 MED ORDER — HYOSCYAMINE SULFATE 0.125 MG PO TABS: 0.1250 mg | ORAL_TABLET | ORAL | 1 refills | Status: AC | PRN

## 2024-06-09 MED ORDER — NORTRIPTYLINE HCL 25 MG PO CAPS: 25.0000 mg | ORAL_CAPSULE | Freq: Every day | ORAL | 1 refills | Status: AC

## 2024-06-09 MED ORDER — DULOXETINE HCL 20 MG PO CPEP: 20.0000 mg | ORAL_CAPSULE | Freq: Every day | ORAL | 1 refills | Status: AC

## 2024-06-09 MED ORDER — GABAPENTIN 100 MG PO CAPS: 200.0000 mg | ORAL_CAPSULE | Freq: Three times a day (TID) | ORAL | 5 refills | Status: AC | PRN

## 2024-06-09 MED ORDER — AIMOVIG 140 MG/ML ~~LOC~~ SOAJ
140.0000 mg | SUBCUTANEOUS | 11 refills | Status: AC
Start: 2024-06-09 — End: ?

## 2024-06-09 MED ORDER — OMEPRAZOLE 40 MG PO CPDR: 40.0000 mg | DELAYED_RELEASE_CAPSULE | Freq: Every day | ORAL | 1 refills | Status: AC

## 2024-06-09 MED ORDER — ONDANSETRON 4 MG PO TBDP: 4.0000 mg | ORAL_TABLET | Freq: Three times a day (TID) | ORAL | 0 refills | Status: AC | PRN

## 2024-06-09 NOTE — Patient Instructions (Signed)
  Plan to start Aimovig  140mg  every 28 days.  If no improvement in 3 months, contact me Take gabapentin  200mg  or Tylenol  or ibuprofen at earliest onset of headache.  Limit use of pain relievers to no more than 9 days out of the month.  These medications include acetaminophen , NSAIDs (ibuprofen/Advil/Motrin, naproxen /Aleve , triptans (Imitrex/sumatriptan), Excedrin, and narcotics.  This will help reduce risk of rebound headaches. Be aware of common food triggers Routine exercise Stay adequately hydrated (aim for 64 oz water daily) Keep headache diary Maintain proper stress management Maintain proper sleep hygiene Do not skip meals Consider supplements:  magnesium citrate 400mg  daily, riboflavin 400mg  daily, coenzyme Q10 100mg  three times daily.

## 2024-06-09 NOTE — Telephone Encounter (Signed)
 PA needed for Aimovig .  Patient has new insurance.

## 2024-06-09 NOTE — Progress Notes (Signed)
 Name: Zyair Rhein   MRN: [id number]    DOB: [birth year]   Date:06/09/2024       Progress Note  Subjective  Chief Complaint  Chief Complaint  Patient presents with   Abdominal Pain    All over stomach, ongoing for 2 weeks getting worst along with nausea, hx of IBS   Nausea   Emesis    Happened yesterday 1st time    Discussed the use of AI scribe software for clinical note transcription with the patient, who gave verbal consent to proceed.  History of Present Illness Vonceil Upshur is a 58 year old female with IBS and GERD who presents with worsening abdominal discomfort.  She has been experiencing worsening abdominal discomfort over the past two weeks, which has become unbearable. The pain is described as different from her usual IBS symptoms, with increased gas, active bowel sounds, and a sensation of gas moving. The discomfort is constant, unlike her typical IBS symptoms that occur after eating.  She has a history of IBS with diarrhea and constipation, functional dyspepsia, and chronic GERD. Recently, she experienced nausea and vomiting, which she hasn't had in years. She reports a lack of appetite, eating very little due to nausea, and experiencing cramps every fifteen seconds after vomiting. She also notes a change in the smell of her stool and a slight episode of diarrhea.  Her past medical workup includes a recent endoscopy and colonoscopy. She has a history of H. pylori infection and was previously on omeprazole  and hyoscyamine , which she has not taken for the past three to four days. She also stopped taking gabapentin  ( given by neurologist for migraines) due to side effects, about two weeks ago. She did not titrate dose down slowly   She mentions recent stress related to family events, including helping her son move and the birth of her first grandchild, which she acknowledges could be affecting her IBS.   She is currently taking vitamin D  and has a history of low  vitamin D  levels.  No recent travel, changes in diet, or illness in the household, except for a niece who had COVID but does not live with her. No runny nose, congestion, or blood in stools. No significant changes in her diarrhea pattern, except for a slight episode yesterday.    Patient Active Problem List   Diagnosis Date Noted   Migraine without aura, not intractable, without status migrainosus 01/02/2024   Chronic post-traumatic headache, not intractable 01/02/2024   Pre-diabetes 01/02/2024   History of colonic polyps 08/30/2022   Chronic GERD 08/30/2022   Gastroesophageal reflux disease 08/30/2022   Lateral epicondylitis of both elbows 07/24/2022   Arthralgia of both hands 07/24/2022   Hot flashes due to menopause 01/05/2022   Anxiety 01/05/2022   Reactive airway disease 07/05/2020   Insomnia, persistent 05/03/2015   Functional dyspepsia 05/03/2015   Gastro-esophageal reflux disease without esophagitis 05/03/2015   Perennial allergic rhinitis with seasonal variation 05/03/2015   Arthritis of temporomandibular joint 05/03/2015   Vitamin D  deficiency 05/03/2015   Irritable bowel syndrome with both constipation and diarrhea 05/03/2015    Social History   Tobacco Use   Smoking status: Never   Smokeless tobacco: Never  Substance Use Topics   Alcohol use: No    Alcohol/week: 0.0 standard drinks of alcohol     Current Outpatient Medications:    AIMOVIG  140 MG/ML SOAJ, Inject into the skin., Disp: , Rfl:    Albuterol -Budesonide (AIRSUPRA ) 90-80 MCG/ACT AERO, Inhale 2 puffs  into the lungs in the morning, at noon, in the evening, and at bedtime., Disp: 32.1 g, Rfl: 0   Cholecalciferol (VITAMIN D ) 50 MCG (2000 UT) CAPS, Take 1 capsule by mouth daily at 12 noon., Disp: , Rfl:    DULoxetine  (CYMBALTA ) 30 MG capsule, Take 1 capsule (30 mg total) by mouth daily., Disp: 90 capsule, Rfl: 1   fexofenadine  (ALLEGRA  ALLERGY ) 180 MG tablet, Take 1 tablet (180 mg total) by mouth as needed.,  Disp: 30 tablet, Rfl: 5   gabapentin  (NEURONTIN ) 300 MG capsule, Take 300 mg by mouth 3 (three) times daily., Disp: , Rfl:    hyoscyamine  (LEVSIN ) 0.125 MG tablet, Take 1 tablet (0.125 mg total) by mouth every 4 (four) hours as needed., Disp: 100 tablet, Rfl: 1   nortriptyline  (PAMELOR ) 25 MG capsule, Take 25 mg by mouth at bedtime., Disp: , Rfl:    omeprazole  (PRILOSEC) 40 MG capsule, Take 1 capsule (40 mg total) by mouth daily., Disp: 90 capsule, Rfl: 1  Allergies  Allergen Reactions   Avocado Hives, Shortness Of Breath and Swelling    and raw fruit and vegetables   Daucus Carota Hives, Shortness Of Breath and Swelling    (Carrots)   Other Hives, Shortness Of Breath and Swelling    Pears, avacados, nuts    ROS  Ten systems reviewed and is negative except as mentioned in HPI    Objective  Vitals:   06/09/24 1106  BP: 108/74  Pulse: 98  Resp: 16  SpO2: 97%  Weight: 135 lb 4.8 oz (61.4 kg)  Height: 5' 3 (1.6 m)    Body mass index is 23.97 kg/m.  Physical Exam CONSTITUTIONAL: Patient appears well-developed and well-nourished. No distress. HEENT: Head atraumatic, normocephalic, neck supple. CARDIOVASCULAR: Normal rate, regular rhythm and normal heart sounds. No murmur heard. No BLE edema. PULMONARY: Effort normal and breath sounds normal. No respiratory distress. ABDOMINAL: Abdomen soft with normal bowel sounds. No distention, mild abdominal discomfort seems more prominent on Left side of abdomen  MUSCULOSKELETAL: Normal gait. Without gross motor or sensory deficit. PSYCHIATRIC: Patient has a normal mood and affect. Behavior is normal. Judgment and thought content normal.  Assessment & Plan Irritable bowel syndrome with diarrhea and constipation and functional dyspepsia Significant change in IBS symptoms with increased abdominal discomfort, nausea, and vomiting. Pain is diffuse with increased bowel sounds and gas. Recent stress may exacerbate symptoms. Differential  includes possible H. pylori recurrence and medication cessation. - Order H. pylori urea breath test. - Restart omeprazole  daily. - Restart hyoscyamine  as needed. - Prescribe nortriptyline  for IBS-related pain. - Advise a bland diet. - Check white blood cell count to rule out infection. - Check CMP  Chronic GERD Increased GERD symptoms, possibly due to stopping omeprazole . Nausea and vomiting may relate to GERD and cessation of reflux medication. - Restart omeprazole  daily. - Prescribe Zofran  for nausea.  Nausea and vomiting Nausea and vomiting are new symptoms. She may relate to GERD, IBS, or cessation of medications. Stress and dietary changes may exacerbate nausea. - Prescribe Zofran  for nausea. - Advise a bland diet.

## 2024-06-10 ENCOUNTER — Ambulatory Visit: Payer: Self-pay | Admitting: Family Medicine

## 2024-06-10 ENCOUNTER — Telehealth: Payer: Self-pay

## 2024-06-10 ENCOUNTER — Other Ambulatory Visit (HOSPITAL_COMMUNITY): Payer: Self-pay

## 2024-06-10 LAB — COMPREHENSIVE METABOLIC PANEL WITH GFR
AG Ratio: 1.9 (calc) (ref 1.0–2.5)
ALT: 13 U/L (ref 6–29)
AST: 18 U/L (ref 10–35)
Albumin: 4.6 g/dL (ref 3.6–5.1)
Alkaline phosphatase (APISO): 57 U/L (ref 37–153)
BUN: 14 mg/dL (ref 7–25)
CO2: 30 mmol/L (ref 20–32)
Calcium: 9.6 mg/dL (ref 8.6–10.4)
Chloride: 101 mmol/L (ref 98–110)
Creat: 0.77 mg/dL (ref 0.50–1.03)
Globulin: 2.4 g/dL (ref 1.9–3.7)
Glucose, Bld: 88 mg/dL (ref 65–99)
Potassium: 3.7 mmol/L (ref 3.5–5.3)
Sodium: 140 mmol/L (ref 135–146)
Total Bilirubin: 1.7 mg/dL — ABNORMAL HIGH (ref 0.2–1.2)
Total Protein: 7 g/dL (ref 6.1–8.1)
eGFR: 91 mL/min/1.73m2 (ref >=60)

## 2024-06-10 LAB — CBC WITH DIFFERENTIAL/PLATELET
Absolute Lymphocytes: 1574 {cells}/uL (ref 850–3900)
Absolute Monocytes: 506 {cells}/uL (ref 200–950)
Basophils Absolute: 18 {cells}/uL (ref 0–200)
Basophils Relative: 0.3 %
Eosinophils Absolute: 159 {cells}/uL (ref 15–500)
Eosinophils Relative: 2.6 %
HCT: 43.1 % (ref 35.0–45.0)
Hemoglobin: 14.4 g/dL (ref 11.7–15.5)
MCH: 28.8 pg (ref 27.0–33.0)
MCHC: 33.4 g/dL (ref 32.0–36.0)
MCV: 86.2 fL (ref 80.0–100.0)
MPV: 9.8 fL (ref 7.5–12.5)
Monocytes Relative: 8.3 %
Neutro Abs: 3843 {cells}/uL (ref 1500–7800)
Neutrophils Relative %: 63 %
Platelets: 236 Thousand/uL (ref 140–400)
RBC: 5 Million/uL (ref 3.80–5.10)
RDW: 13.5 % (ref 11.0–15.0)
Total Lymphocyte: 25.8 %
WBC: 6.1 Thousand/uL (ref 3.8–10.8)

## 2024-06-10 LAB — H. PYLORI BREATH TEST: H. pylori Breath Test: NOT DETECTED

## 2024-06-10 NOTE — Telephone Encounter (Signed)
 Pharmacy Patient Advocate Encounter   Received notification from Pt Calls Messages that prior authorization for Aimovig  140MG /ML auto-injectors is required/requested.   Insurance verification completed.   The patient is insured through [facility] .   Per test claim: PA required; PA submitted to above mentioned insurance via Latent Key/confirmation #/EOC [id number] Status is pending

## 2024-06-12 NOTE — Telephone Encounter (Signed)
 Pharmacy Patient Advocate Encounter  Received notification from [facility] that Prior Authorization for Aimovig  140MG /ML auto-injectors has been DENIED.  Full denial letter will be uploaded to the media tab. See denial reason below.  In order for us  to approve this drug, you must meet our criteria. To meet our criteria, your doctor must provide the following information:  You have a diagnosis of episodic migraine (4 to 14 migraines days per month) or chronic migraine (greater than or equal to 15 headache days per month with greater than or equal to 8 migraine days per month) and all of the following:  Your doctor must tell us  how many headache days you have each month You will not be using another drug of this type, a Calcitonin Gene-Related Peptide (CGRP) inhibitor (such as Ubrelvy), to treat or prevent migraines You tried and failed one drug from one of the following classes for at least two months: Beta blockers (drugs like metoprolol, propranolol, timolol) Topiramate, Divalproex ER, or Divalproex DR You have tried and failed Emgality  PA #/Case ID/Reference #: [id number]

## 2024-07-06 ENCOUNTER — Ambulatory Visit: Admitting: Family Medicine

## 2024-08-24 ENCOUNTER — Other Ambulatory Visit: Payer: Self-pay

## 2024-08-24 ENCOUNTER — Encounter: Payer: Self-pay | Admitting: Neurology

## 2024-08-24 MED ORDER — EMGALITY 120 MG/ML ~~LOC~~ SOAJ: 240.0000 mg | Freq: Once | SUBCUTANEOUS | 0 refills | Status: AC

## 2024-08-25 ENCOUNTER — Other Ambulatory Visit (HOSPITAL_COMMUNITY): Payer: Self-pay

## 2024-08-25 ENCOUNTER — Telehealth: Payer: Self-pay | Admitting: Family Medicine

## 2024-08-25 ENCOUNTER — Telehealth: Payer: Self-pay | Admitting: Pharmacy Technician

## 2024-08-25 NOTE — Telephone Encounter (Signed)
 PA has been submitted, and telephone encounter has been created. Please see telephone encounter dated 11.4.25.  Submitted for loading dose (2 pens)
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
## 2024-08-25 NOTE — Telephone Encounter (Signed)
 Pharmacy Patient Advocate Encounter   Received notification from Patient Advice Request messages that prior authorization for [facility] 120MG  is required/requested.   Insurance verification completed.   The patient is insured through [facility] MEDICAID.   Per test claim: PA required; PA submitted to above mentioned insurance via Latent Key/confirmation #/EOC [id number] Status is pending

## 2024-08-26 NOTE — Telephone Encounter (Signed)
 Pharmacy Patient Advocate Encounter  Received notification from [facility] MEDICAID that Prior Authorization for [facility] 120MG  has been DENIED.  Full denial letter will be uploaded to the media tab. See denial reason below.    PA #/Case ID/Reference #: [id number]

## 2024-08-31 ENCOUNTER — Telehealth: Payer: Self-pay | Admitting: Pharmacist

## 2024-08-31 NOTE — Telephone Encounter (Signed)
 Insurance called to confirm receipt of the appeal, it can take up to 30 days to receive the outcome.

## 2024-08-31 NOTE — Telephone Encounter (Signed)
 Appeal has been submitted. Will advise when response is received or follow up in 1 week. Please be advised that most companies may take 30 days to make a decision. Appeal letter and supporting documentation have been faxed to [phone] on 08/31/2024 @12 :31 pm.  Thank you, Devere Pandy, PharmD Clinical Pharmacist  [place]  Direct Dial: [phone]

## 2024-09-02 ENCOUNTER — Other Ambulatory Visit (HOSPITAL_COMMUNITY): Payer: Self-pay

## 2024-09-02 NOTE — Telephone Encounter (Signed)
 Pharmacy Patient Advocate Encounter  Received notification from [employer] that APPEAL for [facility] 120MG  has been APPROVED from 11.12.25 to 5.12.26. Ran test claim, Copay is $20. This test claim was processed through [facility]- copay amounts may vary at other pharmacies due to pharmacy/plan contracts, or as the patient moves through the different stages of their insurance plan.   PA #/Case ID/Reference #:

## 2024-09-10 ENCOUNTER — Encounter: Payer: Self-pay | Admitting: Neurology

## 2024-09-14 ENCOUNTER — Other Ambulatory Visit: Payer: Self-pay | Admitting: Family Medicine

## 2024-09-14 DIAGNOSIS — J4520 Mild intermittent asthma, uncomplicated: Secondary | ICD-10-CM

## 2024-09-15 NOTE — Telephone Encounter (Signed)
 No longer listed on current medication list Requested Prescriptions  Pending Prescriptions Disp Refills   albuterol  (VENTOLIN  HFA) 108 (90 Base) MCG/ACT inhaler [Pharmacy Med Name: ALBUTEROL  HFA INH (200 PUFFS) 8.5GM] 8.5 g     Sig: INHALE 2 PUFFS INTO THE LUNGS EVERY 6 HOURS AS NEEDED FOR WHEEZING OR SHORTNESS OF BREATH     Pulmonology:  Beta Agonists 2 Passed - 09/15/2024  4:21 PM      Passed - Last BP in normal range    BP Readings from Last 1 Encounters:  06/09/24 113/72         Passed - Last Heart Rate in normal range    Pulse Readings from Last 1 Encounters:  06/09/24 86         Passed - Valid encounter within last 12 months    Recent Outpatient Visits           3 months ago Generalized abdominal pain   [facility] Glenard Mire, MD   8 months ago Chronic post-traumatic headache, not intractable   [facility] Glenard Mire, MD       Future Appointments             In 2 weeks Sowles, Krichna, MD [facility], [place]   In 2 months Sowles, Krichna, MD [facility], [place]

## 2024-09-24 ENCOUNTER — Ambulatory Visit: Admitting: Neurology

## 2024-10-01 NOTE — Patient Instructions (Incomplete)
 Preventive Care 58-56 Years Old, Female  Preventive care refers to lifestyle choices and visits with your health care provider that can promote health and wellness. Preventive care visits are also called wellness exams.  What can I expect for my preventive care visit?  Counseling  Your health care provider may ask you questions about your:  Medical history, including:  Past medical problems.  Family medical history.  Pregnancy history.  Current health, including:  Menstrual cycle.  Method of birth control.  Emotional well-being.  Home life and relationship well-being.  Sexual activity and sexual health.  Lifestyle, including:  Alcohol, nicotine or tobacco, and drug use.  Access to firearms.  Diet, exercise, and sleep habits.  Work and work Astronomer.  Sunscreen use.  Safety issues such as seatbelt and bike helmet use.  Physical exam  Your health care provider will check your:  Height and weight. These may be used to calculate your BMI (body mass index). BMI is a measurement that tells if you are at a healthy weight.  Waist circumference. This measures the distance around your waistline. This measurement also tells if you are at a healthy weight and may help predict your risk of certain diseases, such as type 2 diabetes and high blood pressure.  Heart rate and blood pressure.  Body temperature.  Skin for abnormal spots.  What immunizations do I need?    Vaccines are usually given at various ages, according to a schedule. Your health care provider will recommend vaccines for you based on your age, medical history, and lifestyle or other factors, such as travel or where you work.  What tests do I need?  Screening  Your health care provider may recommend screening tests for certain conditions. This may include:  Lipid and cholesterol levels.  Diabetes screening. This is done by checking your blood sugar (glucose) after you have not eaten for a while (fasting).  Pelvic exam and Pap test.  Hepatitis B test.  Hepatitis C  test.  HIV (human immunodeficiency virus) test.  STI (sexually transmitted infection) testing, if you are at risk.  Lung cancer screening.  Colorectal cancer screening.  Mammogram. Talk with your health care provider about when you should start having regular mammograms. This may depend on whether you have a family history of breast cancer.  BRCA-related cancer screening. This may be done if you have a family history of breast, ovarian, tubal, or peritoneal cancers.  Bone density scan. This is done to screen for osteoporosis.  Talk with your health care provider about your test results, treatment options, and if necessary, the need for more tests.  Follow these instructions at home:  Eating and drinking    Eat a diet that includes fresh fruits and vegetables, whole grains, lean protein, and low-fat dairy products.  Take vitamin and mineral supplements as recommended by your health care provider.  Do not drink alcohol if:  Your health care provider tells you not to drink.  You are pregnant, may be pregnant, or are planning to become pregnant.  If you drink alcohol:  Limit how much you have to 0-1 drink a day.  Know how much alcohol is in your drink. In the [place] one drink equals one 12 oz bottle of beer (355 mL), one 5 oz glass of wine (148 mL), or one 1 oz glass of hard liquor (44 mL).  Lifestyle  Brush your teeth every morning and night with fluoride toothpaste. Floss one time each day.  Exercise for at least  30 minutes 5 or more days each week.  Do not use any products that contain nicotine or tobacco. These products include cigarettes, chewing tobacco, and vaping devices, such as e-cigarettes. If you need help quitting, ask your health care provider.  Do not use drugs.  If you are sexually active, practice safe sex. Use a condom or other form of protection to prevent STIs.  If you do not wish to become pregnant, use a form of birth control. If you plan to become pregnant, see your health care provider for a  prepregnancy visit.  Take aspirin only as told by your health care provider. Make sure that you understand how much to take and what form to take. Work with your health care provider to find out whether it is safe and beneficial for you to take aspirin daily.  Find healthy ways to manage stress, such as:  Meditation, yoga, or listening to music.  Journaling.  Talking to a trusted person.  Spending time with friends and family.  Minimize exposure to UV radiation to reduce your risk of skin cancer.  Safety  Always wear your seat belt while driving or riding in a vehicle.  Do not drive:  If you have been drinking alcohol. Do not ride with someone who has been drinking.  When you are tired or distracted.  While texting.  If you have been using any mind-altering substances or drugs.  Wear a helmet and other protective equipment during sports activities.  If you have firearms in your house, make sure you follow all gun safety procedures.  Seek help if you have been physically or sexually abused.  What's next?  Visit your health care provider once a year for an annual wellness visit.  Ask your health care provider how often you should have your eyes and teeth checked.  Stay up to date on all vaccines.  This information is not intended to replace advice given to you by your health care provider. Make sure you discuss any questions you have with your health care provider.  Document Revised: 04/05/2021 Document Reviewed: 04/05/2021  Elsevier Patient Education  2024 [employer].

## 2024-10-02 ENCOUNTER — Encounter: Payer: Self-pay | Admitting: Family Medicine

## 2024-10-06 NOTE — Patient Instructions (Signed)
 Preventive Care 58-56 Years Old, Female  Preventive care refers to lifestyle choices and visits with your health care provider that can promote health and wellness. Preventive care visits are also called wellness exams.  What can I expect for my preventive care visit?  Counseling  Your health care provider may ask you questions about your:  Medical history, including:  Past medical problems.  Family medical history.  Pregnancy history.  Current health, including:  Menstrual cycle.  Method of birth control.  Emotional well-being.  Home life and relationship well-being.  Sexual activity and sexual health.  Lifestyle, including:  Alcohol, nicotine or tobacco, and drug use.  Access to firearms.  Diet, exercise, and sleep habits.  Work and work Astronomer.  Sunscreen use.  Safety issues such as seatbelt and bike helmet use.  Physical exam  Your health care provider will check your:  Height and weight. These may be used to calculate your BMI (body mass index). BMI is a measurement that tells if you are at a healthy weight.  Waist circumference. This measures the distance around your waistline. This measurement also tells if you are at a healthy weight and may help predict your risk of certain diseases, such as type 2 diabetes and high blood pressure.  Heart rate and blood pressure.  Body temperature.  Skin for abnormal spots.  What immunizations do I need?    Vaccines are usually given at various ages, according to a schedule. Your health care provider will recommend vaccines for you based on your age, medical history, and lifestyle or other factors, such as travel or where you work.  What tests do I need?  Screening  Your health care provider may recommend screening tests for certain conditions. This may include:  Lipid and cholesterol levels.  Diabetes screening. This is done by checking your blood sugar (glucose) after you have not eaten for a while (fasting).  Pelvic exam and Pap test.  Hepatitis B test.  Hepatitis C  test.  HIV (human immunodeficiency virus) test.  STI (sexually transmitted infection) testing, if you are at risk.  Lung cancer screening.  Colorectal cancer screening.  Mammogram. Talk with your health care provider about when you should start having regular mammograms. This may depend on whether you have a family history of breast cancer.  BRCA-related cancer screening. This may be done if you have a family history of breast, ovarian, tubal, or peritoneal cancers.  Bone density scan. This is done to screen for osteoporosis.  Talk with your health care provider about your test results, treatment options, and if necessary, the need for more tests.  Follow these instructions at home:  Eating and drinking    Eat a diet that includes fresh fruits and vegetables, whole grains, lean protein, and low-fat dairy products.  Take vitamin and mineral supplements as recommended by your health care provider.  Do not drink alcohol if:  Your health care provider tells you not to drink.  You are pregnant, may be pregnant, or are planning to become pregnant.  If you drink alcohol:  Limit how much you have to 0-1 drink a day.  Know how much alcohol is in your drink. In the [place] one drink equals one 12 oz bottle of beer (355 mL), one 5 oz glass of wine (148 mL), or one 1 oz glass of hard liquor (44 mL).  Lifestyle  Brush your teeth every morning and night with fluoride toothpaste. Floss one time each day.  Exercise for at least  30 minutes 5 or more days each week.  Do not use any products that contain nicotine or tobacco. These products include cigarettes, chewing tobacco, and vaping devices, such as e-cigarettes. If you need help quitting, ask your health care provider.  Do not use drugs.  If you are sexually active, practice safe sex. Use a condom or other form of protection to prevent STIs.  If you do not wish to become pregnant, use a form of birth control. If you plan to become pregnant, see your health care provider for a  prepregnancy visit.  Take aspirin only as told by your health care provider. Make sure that you understand how much to take and what form to take. Work with your health care provider to find out whether it is safe and beneficial for you to take aspirin daily.  Find healthy ways to manage stress, such as:  Meditation, yoga, or listening to music.  Journaling.  Talking to a trusted person.  Spending time with friends and family.  Minimize exposure to UV radiation to reduce your risk of skin cancer.  Safety  Always wear your seat belt while driving or riding in a vehicle.  Do not drive:  If you have been drinking alcohol. Do not ride with someone who has been drinking.  When you are tired or distracted.  While texting.  If you have been using any mind-altering substances or drugs.  Wear a helmet and other protective equipment during sports activities.  If you have firearms in your house, make sure you follow all gun safety procedures.  Seek help if you have been physically or sexually abused.  What's next?  Visit your health care provider once a year for an annual wellness visit.  Ask your health care provider how often you should have your eyes and teeth checked.  Stay up to date on all vaccines.  This information is not intended to replace advice given to you by your health care provider. Make sure you discuss any questions you have with your health care provider.  Document Revised: 04/05/2021 Document Reviewed: 04/05/2021  Elsevier Patient Education  2024 [employer].

## 2024-10-07 ENCOUNTER — Encounter: Payer: Self-pay | Admitting: Family Medicine

## 2024-10-07 ENCOUNTER — Ambulatory Visit (INDEPENDENT_AMBULATORY_CARE_PROVIDER_SITE_OTHER): Admitting: Family Medicine

## 2024-10-07 VITALS — BP 108/64 | HR 81 | Resp 16 | Ht 63.0 in | Wt 136.6 lb

## 2024-10-07 DIAGNOSIS — Z23 Encounter for immunization: Secondary | ICD-10-CM | POA: Diagnosis not present

## 2024-10-07 DIAGNOSIS — E8881 Metabolic syndrome: Secondary | ICD-10-CM

## 2024-10-07 DIAGNOSIS — Z13820 Encounter for screening for osteoporosis: Secondary | ICD-10-CM

## 2024-10-07 DIAGNOSIS — R7989 Other specified abnormal findings of blood chemistry: Secondary | ICD-10-CM | POA: Diagnosis not present

## 2024-10-07 DIAGNOSIS — Z79899 Other long term (current) drug therapy: Secondary | ICD-10-CM

## 2024-10-07 DIAGNOSIS — Z0001 Encounter for general adult medical examination with abnormal findings: Secondary | ICD-10-CM | POA: Diagnosis not present

## 2024-10-07 DIAGNOSIS — E559 Vitamin D deficiency, unspecified: Secondary | ICD-10-CM | POA: Diagnosis not present

## 2024-10-07 DIAGNOSIS — E2839 Other primary ovarian failure: Secondary | ICD-10-CM

## 2024-10-07 DIAGNOSIS — Z13220 Encounter for screening for lipoid disorders: Secondary | ICD-10-CM

## 2024-10-07 DIAGNOSIS — Z1231 Encounter for screening mammogram for malignant neoplasm of breast: Secondary | ICD-10-CM

## 2024-10-07 DIAGNOSIS — Z0000 Encounter for general adult medical examination without abnormal findings: Secondary | ICD-10-CM

## 2024-10-07 NOTE — Progress Notes (Signed)
 Name: Helen Green   MRN: [id number]    DOB: Feb 14, 1966   Date:10/07/2024       Progress Note  Subjective  Chief Complaint  Chief Complaint  Patient presents with   Annual Exam    HPI  Patient presents for annual CPE.  Diet: cannot eat raw vegetables but loves cooked vegetables, eats mostly chicken, does not like fish, allergic to tree nuts  Exercise:  discussed importance of regular physical activity  Last Eye Exam: encouraged to complete Last Dental Exam: encouraged to complete  Flowsheet Row Office Visit from 10/07/2024 in [facility]  AUDIT-C Score 0   Depression: Phq 9 is  negative    10/07/2024    1:53 PM 06/09/2024   11:06 AM 01/02/2024   10:59 AM 10/01/2023    1:16 PM 09/12/2023   10:06 AM  Depression screen PHQ 2/9  Decreased Interest 0 0 0 0 0  Down, Depressed, Hopeless 0 0 0 0 0  PHQ - 2 Score 0 0 0 0 0  Altered sleeping   0    Tired, decreased energy   0    Change in appetite   0    Feeling bad or failure about yourself    0    Trouble concentrating   0    Moving slowly or fidgety/restless   0    Suicidal thoughts   0    PHQ-9 Score   0     Difficult doing work/chores   Not difficult at all       Data saved with a previous flowsheet row definition   Hypertension: BP Readings from Last 3 Encounters:  10/07/24 108/64  06/09/24 113/72  06/09/24 108/74   Obesity: Wt Readings from Last 3 Encounters:  10/07/24 136 lb 9.6 oz (62 kg)  06/09/24 136 lb (61.7 kg)  06/09/24 135 lb 4.8 oz (61.4 kg)   BMI Readings from Last 3 Encounters:  10/07/24 24.20 kg/m  06/09/24 24.09 kg/m  06/09/24 23.97 kg/m     Vaccines: reviewed with the patient.   Hep C Screening: completed STD testing and prevention (HIV/chl/gon/syphilis): not interested  Intimate partner violence: negative screen  Sexual History : no problems  Menstrual History/LMP/Abnormal Bleeding: s/p hysterectomy  Discussed importance of follow up if any  post-menopausal bleeding: not applicable  Incontinence Symptoms: negative for symptoms   Breast cancer:  - Last Mammogram: 12/2023 - BRCA gene screening: N/A  Osteoporosis Prevention : Discussed high calcium and vitamin D  supplementation, weight bearing exercises Bone density :yes   Cervical cancer screening: up-to-date  Skin cancer: Discussed monitoring for atypical lesions  Colorectal cancer: go back in 2028   Lung cancer:  Low Dose CT Chest recommended if Age 78-80 years, 20 pack-year currently smoking OR have quit w/in 15years. Patient does not qualify for screen   ECG: 2025  Advanced Care Planning: A voluntary discussion about advance care planning including the explanation and discussion of advance directives.  Discussed health care proxy and Living will, and the patient was able to identify a health care proxy as husband .  Patient does not have a living will and power of attorney of health care   Patient Active Problem List   Diagnosis Date Noted   Migraine without aura, not intractable, without status migrainosus 01/02/2024   Chronic post-traumatic headache, not intractable 01/02/2024   Pre-diabetes 01/02/2024   History of colonic polyps 08/30/2022   Chronic GERD 08/30/2022   Arthralgia of both hands 07/24/2022  Hot flashes due to menopause 01/05/2022   Anxiety 01/05/2022   Reactive airway disease 07/05/2020   Insomnia, persistent 05/03/2015   Functional dyspepsia 05/03/2015   Perennial allergic rhinitis with seasonal variation 05/03/2015   Vitamin D  deficiency 05/03/2015   Irritable bowel syndrome with both constipation and diarrhea 05/03/2015    Past Surgical History:  Procedure Laterality Date   ABDOMINAL HYSTERECTOMY     BRAVO PH STUDY N/A 08/30/2022   Procedure: BRAVO PH STUDY;  Surgeon: Unk Corinn Skiff, MD;  Location: [facility] ENDOSCOPY;  Service: Gastroenterology;  Laterality: N/A;  on ppi   COLONOSCOPY WITH PROPOFOL  N/A 09/27/2017   Procedure: COLONOSCOPY  WITH PROPOFOL ;  Surgeon: Therisa Bi, MD;  Location: [facility] ENDOSCOPY;  Service: Gastroenterology;  Laterality: N/A;   COLONOSCOPY WITH PROPOFOL  N/A 08/30/2022   Procedure: COLONOSCOPY WITH PROPOFOL ;  Surgeon: Unk Corinn Skiff, MD;  Location: [facility] ENDOSCOPY;  Service: Gastroenterology;  Laterality: N/A;   ESOPHAGOGASTRODUODENOSCOPY N/A 08/30/2022   Procedure: ESOPHAGOGASTRODUODENOSCOPY (EGD);  Surgeon: Unk Corinn Skiff, MD;  Location: [facility] ENDOSCOPY;  Service: Gastroenterology;  Laterality: N/A;   ESOPHAGOGASTRODUODENOSCOPY (EGD) WITH PROPOFOL  N/A 09/27/2017   Procedure: ESOPHAGOGASTRODUODENOSCOPY (EGD) WITH PROPOFOL ;  Surgeon: Therisa Bi, MD;  Location: [facility] ENDOSCOPY;  Service: Gastroenterology;  Laterality: N/A;   TUBAL LIGATION      Family History  Problem Relation Age of Onset   Diabetes Mother    Hyperlipidemia Mother    Hypertension Mother    Liver disease Father    Cancer Brother 67       brain cancer   Breast cancer Neg Hx     Social History   Socioeconomic History   Marital status: Married    Spouse name: Pearson   Number of children: 2   Years of education: Not on file   Highest education level: Associate degree: academic program  Occupational History   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      Right handed    Two story home   Social Drivers of Health   Tobacco Use: Low Risk (10/07/2024)   Patient History    Smoking Tobacco Use: Never    Smokeless Tobacco Use: Never    Passive  Exposure: Not on file  Financial Resource Strain: Low Risk (10/06/2024)   Overall Financial Resource Strain (CARDIA)    Difficulty of Paying Living Expenses: Not very hard  Food Insecurity: No Food Insecurity (10/06/2024)   Epic    Worried About Programme Researcher, Broadcasting/film/video in the Last Year: Never true    Ran Out of Food in the Last Year: Never true  Transportation Needs: No Transportation Needs (10/06/2024)   Epic    Lack of Transportation (Medical): No    Lack of Transportation (Non-Medical): No  Physical Activity: Insufficiently Active (10/06/2024)   Exercise Vital Sign    Days of Exercise per Week: 1 day    Minutes of Exercise per Session: 10 min  Stress: Stress Concern Present (10/06/2024)   [employer] of Occupational Health - Occupational Stress Questionnaire    Feeling of Stress: To some extent  Social Connections: Socially Integrated (10/06/2024)   Social Connection and Isolation Panel    Frequency of Communication with Friends and Family: More than three times a week    Frequency of Social Gatherings with Friends and Family: Once a week    Attends Religious Services: More than 4 times per year    Active Member of [employer] or Organizations: Yes    Attends Banker Meetings: More than 4 times per year    Marital Status: Married  Catering Manager Violence: Not At Risk (08/10/2022)   Humiliation, Afraid, Rape, and Kick questionnaire    Fear of Current or Ex-Partner: No    Emotionally Abused: No    Physically Abused: No    Sexually Abused: No  Depression (PHQ2-9): Low Risk (10/07/2024)   Depression (PHQ2-9)    PHQ-2 Score: 0  Alcohol Screen: Low Risk (10/07/2024)   Alcohol Screen    Last Alcohol Screening Score (AUDIT): 0  Housing: Unknown (10/06/2024)   Epic    Unable to Pay for Housing in the Last Year: No    Number of Times Moved in the Last Year: Not on file    Homeless in the Last Year: No  Utilities: Not At Risk (10/07/2024)   Epic    Threatened with  loss of utilities: No  Health Literacy: Adequate Health Literacy (10/07/2024)   B1300 Health Literacy    Frequency of need for help with medical instructions: Never    Current Medications[1]  Allergies[2]   ROS  Constitutional: Negative for fever or weight change.  Respiratory: Negative for cough and shortness of breath.   Cardiovascular: Negative for chest pain or palpitations.  Gastrointestinal: Negative for abdominal pain, no bowel changes.  Musculoskeletal: Negative for gait problem or joint swelling.  Skin: Negative for rash.  Neurological: Negative for dizziness or headache.  No other specific complaints in a complete review of systems (except as listed in HPI above).   Objective  Vitals:   10/07/24 1357  BP: 108/64  Pulse: 81  Resp: 16  SpO2: 97%  Weight: 136 lb 9.6 oz (62 kg)  Height: 5' 3 (1.6 m)    Body mass index is 24.2 kg/m.  Physical Exam  Constitutional: Patient appears well-developed and well-nourished. No distress.  HENT: Head: Normocephalic and atraumatic. Ears: B TMs ok, no erythema or effusion; Nose: Nose normal. Mouth/Throat: Oropharynx is clear and moist. No oropharyngeal exudate.  Eyes: Conjunctivae and EOM are normal. Pupils are equal, round, and reactive to light. No scleral icterus.  Neck: Normal range of motion. Neck supple. No JVD present. No thyromegaly present.  Cardiovascular: Normal rate, regular rhythm and normal heart sounds.  No murmur heard. No BLE edema. Pulmonary/Chest: Effort normal and breath sounds normal. No respiratory distress. Abdominal: Soft. Bowel sounds are normal, no distension. There is no tenderness. no masses Breast: no lumps or masses, no nipple discharge or rashes FEMALE GENITALIA:  Not done  RECTAL: not done  Musculoskeletal: Normal range of motion, no joint effusions. No gross deformities Neurological: he is alert and oriented to person, place, and time. No cranial nerve deficit. Coordination, balance,  strength, speech and gait are normal.  Skin: Skin is warm and dry. No rash noted. No erythema.  Psychiatric: Patient has a normal mood and affect. behavior is normal. Judgment and thought content normal.  Assessment & Plan  1. Well adult exam (Primary)  - VITAMIN D  25 Hydroxy (Vit-D Deficiency, Fractures) - B12 and Folate Panel - Lipid panel - Hemoglobin A1c - DG Bone Density; Future - MM 3D SCREENING MAMMOGRAM BILATERAL BREAST; Future - Comprehensive metabolic panel with GFR - CBC with Differential/Platelet  2. Need for Tdap vaccination  - Tdap vaccine greater than or equal to 7yo IM  3. Ovarian failure  - DG Bone Density; Future  4. Osteoporosis screening  - DG Bone Density; Future  5. Screening mammogram for breast cancer  - MM 3D SCREENING MAMMOGRAM BILATERAL BREAST; Future  6. Vitamin D  deficiency  - VITAMIN D  25 Hydroxy (Vit-D Deficiency, Fractures)  7. Metabolic syndrome  - Hemoglobin A1c  8. Long-term use of high-risk medication  - Comprehensive metabolic panel with GFR - CBC with Differential/Platelet  9. Lipid screening  - Lipid panel  10. High serum vitamin B12  - B12 and Folate Panel - CBC with Differential/Platelet    -USPSTF grade A and B recommendations reviewed with patient; age-appropriate recommendations, preventive care, screening tests, etc discussed and encouraged; healthy living encouraged; see AVS for patient education given to patient -Discussed importance of 150 minutes of physical activity weekly, eat two servings of fish weekly, eat one serving of tree nuts ( cashews, pistachios, pecans, almonds.SABRA) every other day, eat 6 servings of fruit/vegetables daily and drink plenty of water and avoid sweet beverages.   -Reviewed Health Maintenance: Yes.      [1]  Current Outpatient Medications:    Albuterol -Budesonide (AIRSUPRA ) 90-80 MCG/ACT AERO, Inhale 2 puffs into the lungs in the morning, at noon, in the evening, and at  bedtime. (Patient taking differently: Inhale 2 puffs into the lungs as needed.), Disp: 32.1 g, Rfl: 0   Cholecalciferol (VITAMIN D ) 50 MCG (2000 UT) CAPS, Take 1 capsule by mouth daily at 12 noon., Disp: , Rfl:    DULoxetine  (CYMBALTA ) 20 MG capsule, Take 1 capsule (20 mg total) by mouth daily. (Patient taking differently: Take 20 mg by mouth as needed.), Disp: 90 capsule, Rfl: 1   EMGALITY  120 MG/ML SOAJ, INJECT 240 MG UNDER THE SKIN ONCE FOR 1 DOSE, Disp: , Rfl:    Erenumab -aooe (AIMOVIG ) 140 MG/ML SOAJ, Inject 140 mg into the skin every 28 (twenty-eight) days., Disp: 1.12 mL, Rfl: 11   fexofenadine  (ALLEGRA  ALLERGY ) 180 MG tablet, Take 1 tablet (180 mg total) by mouth as needed., Disp: 30 tablet, Rfl: 5   gabapentin  (NEURONTIN ) 100 MG capsule, Take 2 capsules (200 mg total) by mouth 3 (three) times daily as needed., Disp: 180 capsule, Rfl: 5   hyoscyamine  (LEVSIN ) 0.125 MG tablet, Take 1 tablet (0.125 mg total) by mouth every 4 (four) hours as needed., Disp: 100 tablet, Rfl: 1   nortriptyline  (PAMELOR ) 25 MG capsule, Take 1 capsule (25 mg total) by mouth at bedtime., Disp: 90 capsule, Rfl: 1   omeprazole  (PRILOSEC) 40 MG capsule, Take 1 capsule (40 mg total) by mouth daily., Disp: 90 capsule, Rfl: 1   ondansetron  (ZOFRAN -ODT) 4 MG disintegrating tablet, Take 1 tablet (4 mg total) by mouth every 8 (eight) hours as needed for nausea or vomiting., Disp: 20 tablet, Rfl: 0 [2]  Allergies Allergen Reactions   Avocado Hives, Shortness Of Breath and Swelling    and raw fruit and vegetables   Daucus Carota Hives, Shortness Of Breath and Swelling    (Carrots)   Germanium Itching and Shortness Of Breath   Other Hives, Shortness Of Breath  and Swelling    Pears, avacados, nuts   Peanut-Containing Drug Products Cough, Itching, Other (See Comments) and Shortness Of Breath

## 2024-10-10 LAB — CBC WITH DIFFERENTIAL/PLATELET
Absolute Lymphocytes: 1890 {cells}/uL (ref 850–3900)
Absolute Monocytes: 415 {cells}/uL (ref 200–950)
Basophils Absolute: 50 {cells}/uL (ref 0–200)
Basophils Relative: 1 %
Eosinophils Absolute: 285 {cells}/uL (ref 15–500)
Eosinophils Relative: 5.7 %
HCT: 40.5 % (ref 35.9–46.0)
Hemoglobin: 13.4 g/dL (ref 11.7–15.5)
MCH: 28.5 pg (ref 27.0–33.0)
MCHC: 33.1 g/dL (ref 31.6–35.4)
MCV: 86.2 fL (ref 81.4–101.7)
MPV: 9.7 fL (ref 7.5–12.5)
Monocytes Relative: 8.3 %
Neutro Abs: 2360 {cells}/uL (ref 1500–7800)
Neutrophils Relative %: 47.2 %
Platelets: 252 Thousand/uL (ref 140–400)
RBC: 4.7 Million/uL (ref 3.80–5.10)
RDW: 13.1 % (ref 11.0–15.0)
Total Lymphocyte: 37.8 %
WBC: 5 Thousand/uL (ref 3.8–10.8)

## 2024-10-10 LAB — COMPREHENSIVE METABOLIC PANEL WITH GFR
AG Ratio: 2.3 (calc) (ref 1.0–2.5)
ALT: 15 U/L (ref 6–29)
AST: 14 U/L (ref 10–35)
Albumin: 4.4 g/dL (ref 3.6–5.1)
Alkaline phosphatase (APISO): 50 U/L (ref 37–153)
BUN: 15 mg/dL (ref 7–25)
CO2: 28 mmol/L (ref 20–32)
Calcium: 9.1 mg/dL (ref 8.6–10.4)
Chloride: 106 mmol/L (ref 98–110)
Creat: 0.65 mg/dL (ref 0.50–1.03)
Globulin: 1.9 g/dL (ref 1.9–3.7)
Glucose, Bld: 91 mg/dL (ref 65–99)
Potassium: 4 mmol/L (ref 3.5–5.3)
Sodium: 142 mmol/L (ref 135–146)
Total Bilirubin: 0.8 mg/dL (ref 0.2–1.2)
Total Protein: 6.3 g/dL (ref 6.1–8.1)
eGFR: 103 mL/min/1.73m2

## 2024-10-10 LAB — VITAMIN D 25 HYDROXY (VIT D DEFICIENCY, FRACTURES): Vit D, 25-Hydroxy: 37 ng/mL (ref 30–100)

## 2024-10-10 LAB — LIPID PANEL
Cholesterol: 128 mg/dL
HDL: 63 mg/dL
LDL Cholesterol (Calc): 51 mg/dL
Non-HDL Cholesterol (Calc): 65 mg/dL
Total CHOL/HDL Ratio: 2 (calc)
Triglycerides: 48 mg/dL

## 2024-10-10 LAB — HEMOGLOBIN A1C
Hgb A1c MFr Bld: 5.6 %
Mean Plasma Glucose: 114 mg/dL
eAG (mmol/L): 6.3 mmol/L

## 2024-10-10 LAB — B12 AND FOLATE PANEL
Folate: >24 ng/mL
Vitamin B-12: 755 pg/mL (ref 200–1100)

## 2024-10-12 ENCOUNTER — Ambulatory Visit: Payer: Self-pay | Admitting: Family Medicine

## 2024-12-10 ENCOUNTER — Ambulatory Visit: Admitting: Family Medicine

## 2025-02-03 ENCOUNTER — Ambulatory Visit: Admitting: Neurology

## 2025-02-08 ENCOUNTER — Ambulatory Visit: Admitting: Neurology

## 2025-10-08 ENCOUNTER — Encounter: Admitting: Family Medicine
